# Patient Record
Sex: Female | Born: 1935 | Hispanic: No | Smoking: Never smoker
Health system: Southern US, Community
[De-identification: ages and names within clinical notes are randomized; demographics above are authoritative.]

## PROBLEM LIST (undated history)

## (undated) DIAGNOSIS — J45909 Unspecified asthma, uncomplicated: Secondary | ICD-10-CM

## (undated) DIAGNOSIS — E7800 Pure hypercholesterolemia, unspecified: Secondary | ICD-10-CM

## (undated) DIAGNOSIS — I10 Essential (primary) hypertension: Secondary | ICD-10-CM

## (undated) DIAGNOSIS — I2510 Atherosclerotic heart disease of native coronary artery without angina pectoris: Secondary | ICD-10-CM

## (undated) DIAGNOSIS — H269 Unspecified cataract: Secondary | ICD-10-CM

## (undated) DIAGNOSIS — M1990 Unspecified osteoarthritis, unspecified site: Secondary | ICD-10-CM

## (undated) DIAGNOSIS — R55 Syncope and collapse: Secondary | ICD-10-CM

## (undated) DIAGNOSIS — F32A Depression, unspecified: Secondary | ICD-10-CM

## (undated) DIAGNOSIS — H918X9 Other specified hearing loss, unspecified ear: Secondary | ICD-10-CM

## (undated) DIAGNOSIS — J984 Other disorders of lung: Secondary | ICD-10-CM

## (undated) DIAGNOSIS — H539 Unspecified visual disturbance: Secondary | ICD-10-CM

## (undated) DIAGNOSIS — J449 Chronic obstructive pulmonary disease, unspecified: Secondary | ICD-10-CM

## (undated) DIAGNOSIS — R262 Difficulty in walking, not elsewhere classified: Secondary | ICD-10-CM

## (undated) DIAGNOSIS — K219 Gastro-esophageal reflux disease without esophagitis: Secondary | ICD-10-CM

## (undated) HISTORY — DX: Chronic obstructive pulmonary disease, unspecified: J44.9

## (undated) HISTORY — DX: Unspecified asthma, uncomplicated: J45.909

---

## 2006-07-07 ENCOUNTER — Ambulatory Visit
Admit: 2006-07-07 | Disposition: A | Discharge status: Home / Self Care | Payer: Self-pay | Source: Ambulatory Visit | Admitting: Internal Medicine

## 2007-06-14 ENCOUNTER — Ambulatory Visit
Admit: 2007-06-14 | Disposition: A | Discharge status: Home / Self Care | Payer: Self-pay | Source: Ambulatory Visit | Admitting: Internal Medicine

## 2007-06-14 LAB — BASIC METABOLIC PANEL - AH CERNER
Anion Gap: 10 mEq/L (ref 5–15)
BUN: 9 mg/dL (ref 8–23)
CO2: 25.6 mEq/L (ref 23.0–32.5)
Calcium: 9.8 mg/dL (ref 8.8–10.2)
Chloride: 95 mEq/L — ABNORMAL LOW (ref 96–108)
Creatinine: 0.7 mg/dL (ref 0.4–1.1)
Glucose: 80 mg/dL
Osmolality Calculated: 270 mosm/kg — ABNORMAL LOW (ref 282–298)
Potassium: 3.9 mEq/L (ref 3.3–5.1)
Sodium: 131 mEq/L — ABNORMAL LOW (ref 133–145)
UN/CREA SOFT: 13 RATIO (ref 6–33)

## 2007-06-14 LAB — HEMOLYSIS INDEX: Hemolysis Index: 6 Units

## 2007-06-14 LAB — GFR

## 2007-06-20 ENCOUNTER — Ambulatory Visit
Admit: 2007-06-20 | Disposition: A | Discharge status: Home / Self Care | Payer: Self-pay | Source: Ambulatory Visit | Admitting: Internal Medicine

## 2012-01-11 ENCOUNTER — Ambulatory Visit
Admit: 2012-01-11 | Discharge: 2012-01-11 | Disposition: A | Discharge status: Home / Self Care | Payer: Self-pay | Source: Ambulatory Visit

## 2012-01-11 LAB — URINE MICROSCOPIC

## 2012-01-11 LAB — COMPREHENSIVE METABOLIC PANEL
ALT: 16 U/L (ref 0–55)
AST (SGOT): 19 U/L (ref 5–34)
Albumin/Globulin Ratio: 1.2 (ref 0.9–2.2)
Albumin: 3.7 g/dL (ref 3.5–5.0)
Alkaline Phosphatase: 50 U/L (ref 40–150)
BUN: 9 mg/dL (ref 6.0–20.0)
Bilirubin, Total: 0.3 mg/dL (ref 0.1–1.2)
CO2: 29 mEq/L (ref 21–30)
Calcium: 10 mg/dL (ref 7.9–10.6)
Chloride: 93 mEq/L — ABNORMAL LOW (ref 96–109)
Creatinine: 0.8 mg/dL (ref 0.4–1.5)
Globulin: 3 g/dL (ref 2.0–3.7)
Glucose: 102 mg/dL — ABNORMAL HIGH (ref 70–100)
Potassium: 4.8 mEq/L (ref 3.5–5.3)
Protein, Total: 6.7 g/dL (ref 6.0–8.3)
Sodium: 131 mEq/L — ABNORMAL LOW (ref 135–146)

## 2012-01-11 LAB — CBC
Hematocrit: 37.7 % (ref 37.0–47.0)
Hgb: 12.9 g/dL (ref 12.0–16.0)
MCH: 29.1 pg (ref 28.0–32.0)
MCHC: 34.2 g/dL (ref 32.0–36.0)
MCV: 84.9 fL (ref 80.0–100.0)
MPV: 8.5 fL — ABNORMAL LOW (ref 9.4–12.3)
Nucleated RBC: 0 /100 WBC
Platelets: 276 10*3/uL (ref 140–400)
RBC: 4.44 10*6/uL (ref 4.20–5.40)
RDW: 13 % (ref 12–15)
WBC: 6.01 10*3/uL (ref 3.50–10.80)

## 2012-01-11 LAB — URINALYSIS
Bilirubin, UA: NEGATIVE
Glucose, UA: NEGATIVE
Ketones UA: NEGATIVE
Leukocyte Esterase, UA: NEGATIVE
Nitrite, UA: NEGATIVE
Protein, UR: NEGATIVE
Specific Gravity UA POCT: 1.008 (ref 1.001–1.035)
Urine pH: 7 (ref 5.0–8.0)
Urobilinogen, UA: 0.2 EU/dL

## 2012-01-11 LAB — GFR: EGFR: >60

## 2012-01-11 LAB — HEMOLYSIS INDEX: Hemolysis Index: 6 Index (ref 0–9)

## 2012-01-11 LAB — AMYLASE: Amylase: 163 U/L — ABNORMAL HIGH (ref 25–125)

## 2012-01-12 ENCOUNTER — Ambulatory Visit
Admit: 2012-01-12 | Discharge: 2012-01-12 | Disposition: A | Discharge status: Home / Self Care | Payer: Self-pay | Source: Ambulatory Visit

## 2012-01-12 LAB — VITAMIN D-25 HYDROXY (D2/D3/TOTAL)
Vitamin D 25-OH D2: <4 ng/mL
Vitamin D 25-OH D3: 39 ng/mL
Vitamin D 25-OH Total: 39 ng/mL (ref 30–100)

## 2012-01-13 ENCOUNTER — Ambulatory Visit
Admission: RE | Admit: 2012-01-13 | Payer: Self-pay | Source: Ambulatory Visit | Attending: Gastroenterology | Admitting: Gastroenterology

## 2012-01-13 ENCOUNTER — Ambulatory Visit: Payer: Self-pay

## 2012-01-21 ENCOUNTER — Ambulatory Visit
Admit: 2012-01-21 | Discharge: 2012-01-21 | Disposition: A | Discharge status: Home / Self Care | Payer: Self-pay | Source: Ambulatory Visit

## 2012-01-21 LAB — HEPATIC FUNCTION PANEL
ALT: 15 U/L (ref 0–55)
AST (SGOT): 18 U/L (ref 5–34)
Albumin/Globulin Ratio: 1.3 (ref 0.9–2.2)
Albumin: 3.8 g/dL (ref 3.5–5.0)
Alkaline Phosphatase: 52 U/L (ref 40–150)
Bilirubin Direct: 0.2 mg/dL (ref 0.0–0.5)
Bilirubin Indirect: 0.3 mg/dL (ref 0.0–1.0)
Bilirubin, Total: 0.5 mg/dL (ref 0.1–1.2)
Globulin: 3 g/dL (ref 2.0–3.7)
Protein, Total: 6.8 g/dL (ref 6.0–8.3)

## 2012-01-21 LAB — LIPID PANEL
Cholesterol / HDL Ratio: 2.4 Index
Cholesterol: 199 mg/dL (ref 0–199)
HDL: 84 mg/dL (ref >40)
LDL Calculated: 100 mg/dL — AB (ref 0–99)
Triglycerides: 76 mg/dL (ref 34–149)
VLDL Calculated: 15 mg/dL (ref 10–40)

## 2012-01-21 LAB — T3: T3: 1.04 ng/mL (ref 0.48–1.59)

## 2012-01-21 LAB — LIPASE: Lipase: 75 U/L (ref 8–78)

## 2012-01-21 LAB — T4, FREE: T4 Free: 1.19 ng/dL (ref 0.70–1.48)

## 2012-01-21 LAB — TSH: TSH: 2.45 u[IU]/mL (ref 0.35–4.94)

## 2012-01-21 LAB — SEDIMENTATION RATE: Sed Rate: 12 mm/Hr (ref 0–20)

## 2012-01-21 LAB — HEMOLYSIS INDEX: Hemolysis Index: 6 Index (ref 0–9)

## 2012-01-21 LAB — AMYLASE: Amylase: 119 U/L (ref 25–125)

## 2012-04-12 ENCOUNTER — Ambulatory Visit
Admit: 2012-04-12 | Discharge: 2012-04-12 | Disposition: A | Discharge status: Home / Self Care | Payer: Self-pay | Source: Ambulatory Visit

## 2012-05-04 ENCOUNTER — Ambulatory Visit
Admission: RE | Admit: 2012-05-04 | Discharge: 2012-05-04 | Disposition: A | Discharge status: Home / Self Care | Payer: Self-pay | Source: Ambulatory Visit | Attending: Gastroenterology | Admitting: Gastroenterology

## 2012-06-08 ENCOUNTER — Encounter
Admission: RE | Disposition: A | Discharge status: Home / Self Care | Payer: Self-pay | Source: Ambulatory Visit | Attending: Gastroenterology

## 2012-06-08 ENCOUNTER — Ambulatory Visit: Payer: Medicare Other | Admitting: Anesthesiology

## 2012-06-08 ENCOUNTER — Encounter: Payer: Self-pay | Admitting: Anesthesiology

## 2012-06-08 ENCOUNTER — Ambulatory Visit
Admission: RE | Admit: 2012-06-08 | Discharge: 2012-06-08 | Disposition: A | Discharge status: Home / Self Care | Payer: Medicare Other | Source: Ambulatory Visit | Attending: Gastroenterology | Admitting: Gastroenterology

## 2012-06-08 ENCOUNTER — Ambulatory Visit: Payer: Medicare Other | Admitting: Gastroenterology

## 2012-06-08 DIAGNOSIS — I10 Essential (primary) hypertension: Secondary | ICD-10-CM | POA: Insufficient documentation

## 2012-06-08 DIAGNOSIS — K648 Other hemorrhoids: Secondary | ICD-10-CM | POA: Insufficient documentation

## 2012-06-08 DIAGNOSIS — J40 Bronchitis, not specified as acute or chronic: Secondary | ICD-10-CM | POA: Insufficient documentation

## 2012-06-08 DIAGNOSIS — K219 Gastro-esophageal reflux disease without esophagitis: Secondary | ICD-10-CM | POA: Insufficient documentation

## 2012-06-08 DIAGNOSIS — K5730 Diverticulosis of large intestine without perforation or abscess without bleeding: Secondary | ICD-10-CM | POA: Insufficient documentation

## 2012-06-08 DIAGNOSIS — F3289 Other specified depressive episodes: Secondary | ICD-10-CM | POA: Insufficient documentation

## 2012-06-08 DIAGNOSIS — D126 Benign neoplasm of colon, unspecified: Secondary | ICD-10-CM | POA: Insufficient documentation

## 2012-06-08 DIAGNOSIS — H9190 Unspecified hearing loss, unspecified ear: Secondary | ICD-10-CM | POA: Insufficient documentation

## 2012-06-08 DIAGNOSIS — Z1211 Encounter for screening for malignant neoplasm of colon: Secondary | ICD-10-CM | POA: Insufficient documentation

## 2012-06-08 HISTORY — DX: Essential (primary) hypertension: I10

## 2012-06-08 HISTORY — DX: Unspecified cataract: H26.9

## 2012-06-08 HISTORY — DX: Depression, unspecified: F32.A

## 2012-06-08 HISTORY — DX: Unspecified visual disturbance: H53.9

## 2012-06-08 HISTORY — DX: Difficulty in walking, not elsewhere classified: R26.2

## 2012-06-08 HISTORY — DX: Syncope and collapse: R55

## 2012-06-08 HISTORY — DX: Unspecified osteoarthritis, unspecified site: M19.90

## 2012-06-08 HISTORY — DX: Other specified hearing loss, unspecified ear: H91.8X9

## 2012-06-08 HISTORY — DX: Other disorders of lung: J98.4

## 2012-06-08 HISTORY — DX: Gastro-esophageal reflux disease without esophagitis: K21.9

## 2012-06-08 SURGERY — DONT USE, USE 1094-COLONOSCOPY, DIAGNOSTIC (SCREENING)
Anesthesia: Anesthesia General | Site: Abdomen | Wound class: Clean Contaminated

## 2012-06-08 MED ORDER — PROPOFOL INFUSION 10 MG/ML
INTRAVENOUS | Status: DC | PRN
Start: 2012-06-08 — End: 2012-06-08
  Administered 2012-06-08 (×2): 40 mg via INTRAVENOUS

## 2012-06-08 MED ORDER — LACTATED RINGERS IV SOLN
INTRAVENOUS | Status: DC | PRN
Start: 2012-06-08 — End: 2012-06-08

## 2012-06-08 MED ORDER — ESMOLOL HCL 10 MG/ML IV SOLN
INTRAVENOUS | Status: DC | PRN
Start: 2012-06-08 — End: 2012-06-08
  Administered 2012-06-08: 10 mg via INTRAVENOUS

## 2012-06-08 MED ORDER — PROPOFOL INFUSION 10 MG/ML
INTRAVENOUS | Status: DC | PRN
Start: 2012-06-08 — End: 2012-06-08
  Administered 2012-06-08: 120 ug/kg/min via INTRAVENOUS

## 2012-06-08 MED ORDER — LABETALOL HCL 5 MG/ML IV SOLN
INTRAVENOUS | Status: DC | PRN
Start: 2012-06-08 — End: 2012-06-08
  Administered 2012-06-08: 5 mg via INTRAVENOUS

## 2012-06-08 MED ORDER — LIDOCAINE HCL 2 % IJ SOLN
INTRAMUSCULAR | Status: DC | PRN
Start: 2012-06-08 — End: 2012-06-08
  Administered 2012-06-08: 40 mg

## 2012-06-08 SURGICAL SUPPLY — 10 items
FORCEPS BIOPSY L240 CM MICROMESH TEETH STREAMLINE CATHETER NEEDLE (Instrument) IMPLANT
FORCEPS BIOPSY L240 CM STANDARD CAPACITY (Instrument) ×1
GOWN ISL PP PE REG LG LF FULL BCK NK TIE (Gown) ×2
GOWN ISOLATION REGULAR LARGE FULL BACK NECK TIE ELASTIC CUFF (Gown) ×2 IMPLANT
SOLN LUBRICATING JELLY 4.25OZ (Irrigation Solutions) ×1 IMPLANT
SPONGE GAUZE L4 IN X W4 IN 16 PLY (Dressing) ×1
SPONGE GAUZE L4 IN X W4 IN 16 PLY MAXIMUM ABSORBENT USP TYPE VII (Dressing) ×1 IMPLANT
WATER STERILE PLASTIC POUR BOTTLE 1000 (Irrigation Solutions) ×1
WATER STERILE PLASTIC POUR BOTTLE 1000 ML (Irrigation Solutions) ×1 IMPLANT
WATER STERILE PLASTIC POUR BOTTLE 250 ML (Irrigation Solutions) ×1 IMPLANT

## 2012-06-08 NOTE — Anesthesia Postprocedure Evaluation (Signed)
Anesthesia Post Evaluation    Patient: Jane [name]    Procedures performed: Procedure(s) with comments:  COLONOSCOPY - colon     Anesthesia type: General TIVA    Patient location:[facility]    Last vitals:   Filed Vitals:    06/08/12 1055   BP: 155/70   Pulse: 80   Temp:    Resp: 23   SpO2: 100%       Post pain: Patient not complaining of pain, continue current therapy     Mental Status:awake and alert     Respiratory Function: tolerating room air    Cardiovascular: stable    Nausea/Vomiting: patient not complaining of nausea or vomiting    Hydration Status: adequate    Post assessment: no apparent anesthetic complications and no reportable events

## 2012-06-08 NOTE — Anesthesia Preprocedure Evaluation (Signed)
Anesthesia Evaluation    AIRWAY    Mallampati: II    TM distance: >3 FB  Neck ROM: full  Mouth Opening:full   CARDIOVASCULAR    cardiovascular exam normal     DENTAL        (+) upper dentures and lower dentures   PULMONARY    pulmonary exam normal     OTHER FINDINGS              Anesthesia Plan    ASA 2   general   Detailed anesthesia plan: general IV        informed consent obtained    Plan discussed with CRNA.

## 2012-06-08 NOTE — Transfer of Care (Signed)
Anesthesia Transfer of Care Note    Patient: Jane [name]    Procedures performed: Procedure(s) with comments:  COLONOSCOPY - colon     Anesthesia type: MAC    Patient location:[facility]    Last vitals:   Filed Vitals:    06/08/12 1034   BP: 130/60   Pulse: 75   Temp: 97.7 F (36.5 C)   Resp: 18   SpO2: 100%       Post pain: Patient not complaining of pain, continue current therapy     Mental Status:sleepy    Respiratory Function: tolerating nasal cannula    Cardiovascular: stable    Nausea/Vomiting: patient not complaining of nausea or vomiting    Hydration Status: adequate    Post assessment: no apparent anesthetic complications

## 2012-06-08 NOTE — H&P (Signed)
GI PRE PROCEDURE NOTE    Proceduralist Comments:   Review of Systems and Past Medical / Surgical History performed: Yes     Indications:Colon cancer screening    Previous Adverse Reaction to Anesthesia or Sedation (if yes, describe): No    Physical Exam / Laboratory Data (If applicable)   Airway Classification: As per the anesthesiologist    General: Alert and cooperative  Lungs: Lungs clear to auscultation  Cardiac: RRR, normal S1S2.    Abdomen: Soft, non tender. Normal active bowel sounds  Other:        Scheduled Meds: PRN Meds:         Continuous Infusions:       . lactated ringers 20 mL/hr at 06/08/12 0953         lactated ringers  Continuous PRN           Home Medications:     Prescriptions prior to admission   Medication Sig Dispense Refill   . aspirin 81 MG tablet Take 81 mg by mouth daily.       . fluticasone-salmeterol (ADVAIR DISKUS) 100-50 MCG/DOSE AEPB Inhale 1 puff into the lungs.       . metoprolol XL (TOPROL-XL) 25 MG 24 hr tablet Take 25 mg by mouth daily.       . Multiple Vitamin (MULTIVITAMIN) tablet Take 1 tablet by mouth daily.       . Multiple Vitamins-Minerals (CENTRUM SILVER PO) Take by mouth.       . sertraline (ZOLOFT) 50 MG tablet Take 50 mg by mouth daily.       . simvastatin (ZOCOR) 20 MG tablet Take 20 mg by mouth nightly.       Marland Kitchen VALSARTAN PO Take 12.5 mg by mouth.           Planned Sedation:   Deep sedation with anesthesia    Attestation:   Kashina P Poss has been reassessed immediately prior to the procedure and is an appropriate candidate for the planned sedation and procedure. Risks, benefits and alternatives to the planned procedure and sedation have been explained to the patient or guardian:  yes        Signed by: Jossie Ng.D.

## 2013-05-13 ENCOUNTER — Emergency Department: Payer: Medicare Other

## 2013-05-13 ENCOUNTER — Inpatient Hospital Stay: Payer: Medicare Other | Admitting: Internal Medicine

## 2013-05-13 ENCOUNTER — Inpatient Hospital Stay
Admission: EM | Admit: 2013-05-13 | Discharge: 2013-05-18 | DRG: 287 | Disposition: A | Discharge status: Home Health | Payer: Medicare Other | Attending: Internal Medicine | Admitting: Internal Medicine

## 2013-05-13 DIAGNOSIS — R531 Weakness: Secondary | ICD-10-CM | POA: Diagnosis present

## 2013-05-13 DIAGNOSIS — K5900 Constipation, unspecified: Secondary | ICD-10-CM | POA: Diagnosis present

## 2013-05-13 DIAGNOSIS — I079 Rheumatic tricuspid valve disease, unspecified: Secondary | ICD-10-CM | POA: Diagnosis present

## 2013-05-13 DIAGNOSIS — R5381 Other malaise: Secondary | ICD-10-CM | POA: Diagnosis present

## 2013-05-13 DIAGNOSIS — R269 Unspecified abnormalities of gait and mobility: Secondary | ICD-10-CM | POA: Diagnosis present

## 2013-05-13 DIAGNOSIS — M129 Arthropathy, unspecified: Secondary | ICD-10-CM | POA: Diagnosis present

## 2013-05-13 DIAGNOSIS — I209 Angina pectoris, unspecified: Secondary | ICD-10-CM | POA: Diagnosis present

## 2013-05-13 DIAGNOSIS — D649 Anemia, unspecified: Secondary | ICD-10-CM | POA: Diagnosis present

## 2013-05-13 DIAGNOSIS — I2510 Atherosclerotic heart disease of native coronary artery without angina pectoris: Principal | ICD-10-CM | POA: Diagnosis present

## 2013-05-13 DIAGNOSIS — E871 Hypo-osmolality and hyponatremia: Secondary | ICD-10-CM | POA: Diagnosis present

## 2013-05-13 DIAGNOSIS — I10 Essential (primary) hypertension: Secondary | ICD-10-CM | POA: Diagnosis present

## 2013-05-13 DIAGNOSIS — F411 Generalized anxiety disorder: Secondary | ICD-10-CM | POA: Diagnosis present

## 2013-05-13 DIAGNOSIS — E7800 Pure hypercholesterolemia, unspecified: Secondary | ICD-10-CM | POA: Diagnosis present

## 2013-05-13 DIAGNOSIS — F0280 Dementia in other diseases classified elsewhere without behavioral disturbance: Secondary | ICD-10-CM | POA: Diagnosis present

## 2013-05-13 DIAGNOSIS — R5383 Other fatigue: Secondary | ICD-10-CM | POA: Diagnosis present

## 2013-05-13 DIAGNOSIS — F3289 Other specified depressive episodes: Secondary | ICD-10-CM | POA: Diagnosis present

## 2013-05-13 DIAGNOSIS — G309 Alzheimer's disease, unspecified: Secondary | ICD-10-CM | POA: Diagnosis present

## 2013-05-13 DIAGNOSIS — K219 Gastro-esophageal reflux disease without esophagitis: Secondary | ICD-10-CM | POA: Diagnosis present

## 2013-05-13 DIAGNOSIS — J45909 Unspecified asthma, uncomplicated: Secondary | ICD-10-CM | POA: Diagnosis present

## 2013-05-13 LAB — MAN DIFF ONLY
Atypical Lymphocytes %: 7 %
Atypical Lymphocytes Absolute: 0.57 — ABNORMAL HIGH
Band Neutrophils Absolute: 0 (ref 0.00–1.00)
Band Neutrophils: 0 %
Basophils Absolute Manual: 0 (ref 0.00–0.20)
Basophils Manual: 0 %
Eosinophils Absolute Manual: 0.08 (ref 0.00–0.70)
Eosinophils Manual: 1 %
Lymphocytes Absolute Manual: 1.55 (ref 0.50–4.40)
Lymphocytes Manual: 19 %
Monocytes Absolute: 0.82 (ref 0.00–1.20)
Monocytes Manual: 10 %
Neutrophils Absolute Manual: 5.15 (ref 1.80–8.10)
Nucleated RBC: 0 (ref 0–1)
Segmented Neutrophils: 63 %

## 2013-05-13 LAB — COMPREHENSIVE METABOLIC PANEL
ALT: 14 U/L (ref 0–55)
AST (SGOT): 22 U/L (ref 5–34)
Albumin/Globulin Ratio: 1.4 (ref 0.9–2.2)
Albumin: 3.8 g/dL (ref 3.5–5.0)
Alkaline Phosphatase: 35 U/L — ABNORMAL LOW (ref 40–150)
Anion Gap: 11 (ref 5.0–15.0)
BUN: 11 mg/dL (ref 7.0–19.0)
Bilirubin, Total: 0.6 mg/dL (ref 0.2–1.2)
CO2: 23 (ref 22–29)
Calcium: 9.7 mg/dL (ref 7.9–10.6)
Chloride: 96 — ABNORMAL LOW (ref 98–107)
Creatinine: 0.8 mg/dL (ref 0.6–1.0)
Globulin: 2.8 g/dL (ref 2.0–3.6)
Glucose: 91 mg/dL (ref 70–100)
Potassium: 4.3 (ref 3.5–5.1)
Protein, Total: 6.6 g/dL (ref 6.0–8.3)
Sodium: 130 — ABNORMAL LOW (ref 136–145)

## 2013-05-13 LAB — CBC AND DIFFERENTIAL
Hematocrit: 38.3 % (ref 37.0–47.0)
Hgb: 13.6 g/dL (ref 12.0–16.0)
MCH: 30.4 pg (ref 28.0–32.0)
MCHC: 35.5 g/dL (ref 32.0–36.0)
MCV: 85.5 fL (ref 80.0–100.0)
MPV: 7.5 fL — ABNORMAL LOW (ref 9.4–12.3)
Platelets: 345 (ref 140–400)
RBC: 4.48 (ref 4.20–5.40)
RDW: 13 % (ref 12–15)
WBC: 8.17 (ref 3.50–10.80)

## 2013-05-13 LAB — B-TYPE NATRIURETIC PEPTIDE: B-Natriuretic Peptide: 61 pg/mL (ref 0–100)

## 2013-05-13 LAB — CK: Creatine Kinase (CK): 56 U/L (ref 29–168)

## 2013-05-13 LAB — TROPONIN I: Troponin I: 0.02 ng/mL (ref 0.00–0.09)

## 2013-05-13 LAB — GFR: EGFR: >60

## 2013-05-13 MED ORDER — NITROGLYCERIN 2 % TD OINT
0.5000 [in_us] | TOPICAL_OINTMENT | Freq: Once | TRANSDERMAL | Status: AC
Start: 2013-05-13 — End: 2013-05-13
  Administered 2013-05-13: 0.5 [in_us] via TOPICAL
  Filled 2013-05-13: qty 1

## 2013-05-13 MED ORDER — ASPIRIN 81 MG PO CHEW
324.0000 mg | CHEWABLE_TABLET | Freq: Once | ORAL | Status: AC
Start: 2013-05-13 — End: 2013-05-13
  Administered 2013-05-13: 324 mg via ORAL
  Filled 2013-05-13: qty 4

## 2013-05-13 NOTE — ED Notes (Signed)
C/o left side chest pain x 5 days, denies SOB, cough, urinary problem and abdominal pain.

## 2013-05-13 NOTE — ED Provider Notes (Signed)
EMERGENCY DEPARTMENT HISTORY AND PHYSICAL EXAM     Physician/Midlevel provider first contact with patient: 05/13/13 2015         Date: 05/13/2013  Patient Name: Jane [name]    History of Presenting Illness     Chief Complaint   Patient presents with   . Chest Pain       History Provided By: Patient and Daughter    Chief Complaint: Chest pain  Onset: Acute  Timing: Constant   Location: Center chest  Quality:  Pressure, heaviness  Severity: Moderate  Modifying Factors: None  Associated Symptoms: Weakness    Additional History: Jane [name] is a 79 y.o. female presents to ED with chest pain that started approximately 2 hours ago while a passenger driving back from [place]. Pt daughter states she was seen in [place], [place] approx 1 week ago for chest pain and states hospital diagnosis was "chest pain due to old age".  Pain started around 630 this afternoon.  Pt states pain is heavy, and pressure, does not radiate anywhere. Pt states nothing makes pain better or worse.     PCP: Darnelle Catalan, MD      Current Facility-Administered Medications   Medication Dose Route Frequency Provider Last Rate Last Dose   . [COMPLETED] aspirin chewable tablet 324 mg  324 mg Oral Once Sharyne Richters, MD   324 mg at 05/13/13 2145   . [COMPLETED] nitroglycerin (NITRO-BID) 2 % ointment 0.5 inch  0.5 inch Topical Once Sharyne Richters, MD   0.5 inch at 05/13/13 2329     Current Outpatient Prescriptions   Medication Sig Dispense Refill   . calcium-vitamin D (OSCAL-500) 500-200 MG-UNIT per tablet Take 1 tablet by mouth daily.       Marland Kitchen econazole nitrate 1 % cream Apply topically daily.       . furosemide (LASIX) 20 MG tablet Take 20 mg by mouth 2 (two) times daily.       . Misc Natural Products (HIMALAYAN GOJI PO) Take by mouth.       . simethicone (MYLICON) 80 MG chewable tablet Chew 80 mg by mouth every 6 (six) hours as needed.       Marland Kitchen aspirin 81 MG tablet Take 81 mg by mouth daily.       . fluticasone-salmeterol (ADVAIR DISKUS) 100-50  MCG/DOSE AEPB Inhale 1 puff into the lungs.       . metoprolol XL (TOPROL-XL) 25 MG 24 hr tablet Take 25 mg by mouth daily.       . Multiple Vitamin (MULTIVITAMIN) tablet Take 1 tablet by mouth daily.       . Multiple Vitamins-Minerals (CENTRUM SILVER PO) Take by mouth.       . sertraline (ZOLOFT) 50 MG tablet Take 50 mg by mouth daily.       . simvastatin (ZOCOR) 20 MG tablet Take 20 mg by mouth nightly.       Marland Kitchen VALSARTAN PO Take 12.5 mg by mouth.           Past History     Past Medical History:  Past Medical History   Diagnosis Date   . Arthritis    . Hypertensive disorder    . Lung disease      Bronchitis   . Syncope and collapse      Weak with Wt. loss has difficulty walking   . GERD (gastroesophageal reflux disease)    . Difficulty in walking(719.7)    . Cataracts, bilateral    .  Vision abnormalities    . Hearing loss NEC    . Depression        Past Surgical History:  Past Surgical History   Procedure Date   . Upper gastrointestinal endoscopy        Family History:  History reviewed. No pertinent family history.    Social History:  History   Substance Use Topics   . Smoking status: Never Smoker    . Smokeless tobacco: Not on file   . Alcohol Use: No       Allergies:  No Known Allergies    Review of Systems     Review of Systems   Cardiovascular: Positive for chest pain.        + chest pressure, heaviness   Psychiatric/Behavioral: Positive for depression.   All other systems reviewed and are negative.          Physical Exam   BP 197/90  Pulse 81  Temp 98.9 F (37.2 C)  Resp 16  Ht 1.499 m  Wt 50 kg  BMI 22.25 kg/m2  SpO2 99%    Physical Exam   Nursing note and vitals reviewed.  Constitutional: She is oriented to person, place, and time. She appears well-developed and well-nourished.   HENT:   Mouth/Throat: Oropharynx is clear and moist.   Eyes: Conjunctivae normal are normal. Pupils are equal, round, and reactive to light.   Neck: No JVD present.   Cardiovascular: Normal rate and regular rhythm.     Pulmonary/Chest: Effort normal and breath sounds normal.   Abdominal: Soft. Bowel sounds are normal.   Musculoskeletal:        No lower ext tenderness/edema   Neurological: She is alert and oriented to person, place, and time.   Skin: Skin is warm and dry.         Diagnostic Study Results     Labs -     Results     Procedure Component Value Units Date/Time    CK with MB if Indicated [[id number]] Collected:05/13/13 2035    Specimen Information:Blood Updated:05/13/13 2151     Creatine Kinase (CK) 56 U/L     Troponin I [[id number]] Collected:05/13/13 2059    Specimen Information:Blood Updated:05/13/13 2141     Troponin I 0.02 ng/mL     CBC with differential [[id number]]  (Abnormal) Collected:05/13/13 2059    Specimen Information:Blood / Blood Updated:05/13/13 2130     WBC 8.17      RBC 4.48      Hgb 13.6 g/dL      Hematocrit 08.6 %      MCV 85.5 fL      MCH 30.4 pg      MCHC 35.5 g/dL      RDW 13 %      Platelets 345      MPV 7.5 (L) fL     Manual Differential [[id number]]  (Abnormal) Collected:05/13/13 2059     Segmented Neutrophils 63 % Updated:05/13/13 2130     Band Neutrophils 0 %      Lymphocytes Manual 19 %      Monocytes Manual 10 %      Eosinophils Manual 1 %      Basophils Manual 0 %      Atypical Lymph % 7 %      Nucleated RBC 0      Abs Seg Manual 5.15      Bands Absolute 0.00      Absolute Lymph Manual 1.55  Monocytes Absolute 0.82      Absolute Eos Manual 0.08      Absolute Baso Manual 0.00      Atypical Lymph Absolute 0.57 (H)     B-type Natriuretic Peptide (BNP) [[id number]] Collected:05/13/13 2059    Specimen Information:Blood Updated:05/13/13 2123     B-Natriuretic Peptide 61 pg/mL     Comprehensive metabolic panel [[id number]]  (Abnormal) Collected:05/13/13 2035    Specimen Information:Blood Updated:05/13/13 2119     Glucose 91 mg/dL      BUN 78.2 mg/dL      Creatinine 0.8 mg/dL      Sodium 956 (L)      Potassium 4.3      Chloride 96 (L)      CO2 23      CALCIUM 9.7 mg/dL      Protein, Total 6.6 g/dL       Albumin 3.8 g/dL      AST (SGOT) 22 U/L      ALT 14 U/L      Alkaline Phosphatase 35 (L) U/L      Bilirubin, Total 0.6 mg/dL      Globulin 2.8 g/dL      Albumin/Globulin Ratio 1.4      Anion Gap 11.0     GFR [[id number]] Collected:05/13/13 2035     EGFR >60.0 Updated:05/13/13 2119          Radiologic Studies -   Radiology Results (24 Hour)     Procedure Component Value Units Date/Time    Chest 2 Views [[id number]] Collected:05/13/13 2224    Order Status:Completed  Updated:05/13/13 2230    Narrative:    HISTORY: chest pain    TECHNIQUE: PA and lateral views of the chest were obtained.     COMPARISON: 04/12/2012.    FINDINGS:    The cardiomediastinal contours are within normal limits.   No pneumothorax is seen. There are stable prominent interstitial  markings at the hila and lung bases compatible with prominent vascular  markings and scarring. No superimposed focal consolidations are  demonstrated. A 3 mm calcified granuloma is noted within the right  midlung.       Impression:     No evidence of acute cardiopulmonary abnormality.    Gustavus Messing, MD   05/13/2013 10:26 PM      .      Medical Decision Making   I am the first provider for this patient.    I reviewed the vital signs, available nursing notes, past medical history, past surgical history, family history and social history.    Vital Signs-Reviewed the patient's vital signs.     Patient Vitals for the past 12 hrs:   BP Temp Pulse Resp   05/13/13 2300 197/90 mmHg - 81  -   05/13/13 2217 194/87 mmHg - 87  16    05/13/13 2157 184/95 mmHg - 88  18    05/13/13 2025 128/86 mmHg 98.9 F (37.2 C) 85  18        Pulse Oximetry Analysis - Normal 98% on RA    EKG:  Interpreted by the EP.   Time Interpreted: 2006   Rate: 88   Rhythm: Normal Sinus Rhythm NSR    Interpretation: less than 1 mm inferior and lateral depressions   Comparison: No prior study is available for comparison.    Old Medical Records: Nursing notes.     ED Course:   9:49 PM - Discussed pt case with Dr.  Philip Aspen, cardiology, who  will consult.     10:15 PM - D/w Dr. Rowland Lathe, covering for Dr. Arie Sabina, will admit.     10:51 PM -  Patient transport documents collected and filled out for discharge.    Provider Notes: Discussed at length with daughter.  EKG abnl, but w/out elevations in cardiac enzymes.  Patient has previously seen Dr. Graciela Husbands and requests his services for admission.  Admit for evaluation.      Core Measures:  ASA given.     Diagnosis     Clinical Impression:   1. Chest pain        _______________________________    Attestations:  This note is prepared by Gerrit Friends, acting as Scribe for Ulice Bold, MD    Ulice Bold, MD:  The scribe's documentation has been prepared under my direction and personally reviewed by me in its entirety.  I confirm that the note above accurately reflects all work, treatment, procedures, and medical decision making performed by me.        _______________________________          Sharyne Richters, MD  05/13/13 [phone]

## 2013-05-13 NOTE — ED Notes (Addendum)
Pt was unable to urinate Dr. Fran Lowes notified

## 2013-05-13 NOTE — ED Notes (Signed)
Pt went to bathroom x2 to get urine specimen but unable to void.

## 2013-05-14 LAB — URINALYSIS, REFLEX TO MICROSCOPIC EXAM IF INDICATED
Bilirubin, UA: NEGATIVE
Blood, UA: NEGATIVE
Glucose, UA: NEGATIVE
Ketones UA: NEGATIVE
Nitrite, UA: NEGATIVE
Protein, UR: NEGATIVE
Specific Gravity UA: 1.006 (ref 1.001–1.035)
Urine pH: 8 (ref 5.0–8.0)
Urobilinogen, UA: NEGATIVE mg/dL

## 2013-05-14 LAB — ECG 12-LEAD
Atrial Rate: 88 {beats}/min
P Axis: 80 degrees
P-R Interval: 128 ms
Q-T Interval: 338 ms
QRS Duration: 74 ms
QTC Calculation (Bezet): 408 ms
R Axis: 48 degrees
T Axis: 65 degrees
Ventricular Rate: 88 {beats}/min

## 2013-05-14 LAB — CK
Creatine Kinase (CK): 52 U/L (ref 29–168)
Creatine Kinase (CK): 49 U/L (ref 29–168)

## 2013-05-14 LAB — HEMOLYSIS INDEX
Hemolysis Index: 13 (ref 0–18)
Hemolysis Index: 11 (ref 0–18)

## 2013-05-14 LAB — TROPONIN I
Troponin I: 0.04 ng/mL (ref 0.00–0.09)
Troponin I: 0.05 ng/mL (ref 0.00–0.09)

## 2013-05-14 LAB — IHS D-DIMER: D-Dimer: 0.22 (ref 0.00–0.51)

## 2013-05-14 MED ORDER — FLUTICASONE-SALMETEROL 100-50 MCG/DOSE IN AEPB
1.0000 | INHALATION_SPRAY | Freq: Two times a day (BID) | RESPIRATORY_TRACT | Status: DC
Start: 2013-05-14 — End: 2013-05-18
  Administered 2013-05-14 – 2013-05-18 (×6): 1 via RESPIRATORY_TRACT
  Filled 2013-05-14: qty 14

## 2013-05-14 MED ORDER — SIMVASTATIN 10 MG PO TABS
20.0000 mg | ORAL_TABLET | Freq: Every evening | ORAL | Status: DC
Start: 2013-05-14 — End: 2013-05-18
  Administered 2013-05-14 – 2013-05-17 (×4): 20 mg via ORAL
  Filled 2013-05-14 (×4): qty 2

## 2013-05-14 MED ORDER — VITAMINS/MINERALS PO TABS
1.0000 | ORAL_TABLET | Freq: Every day | ORAL | Status: DC
Start: 2013-05-14 — End: 2013-05-18
  Administered 2013-05-14 – 2013-05-18 (×5): 1 via ORAL
  Filled 2013-05-14 (×5): qty 1

## 2013-05-14 MED ORDER — ASPIRIN 81 MG PO CHEW
324.0000 mg | CHEWABLE_TABLET | Freq: Once | ORAL | Status: AC
Start: 2013-05-14 — End: 2013-05-14
  Administered 2013-05-14: 324 mg via ORAL
  Filled 2013-05-14: qty 4

## 2013-05-14 MED ORDER — CALCIUM CITRATE-VITAMIN D 315-250 MG-UNIT PO TABS
1.0000 | ORAL_TABLET | Freq: Every day | ORAL | Status: DC
Start: 2013-05-14 — End: 2013-05-18
  Administered 2013-05-14 – 2013-05-18 (×5): 1 via ORAL
  Filled 2013-05-14 (×5): qty 1

## 2013-05-14 MED ORDER — AQUADEKS PO CAPS
1.0000 | ORAL_CAPSULE | Freq: Every day | ORAL | Status: DC
Start: 2013-05-14 — End: 2013-05-14

## 2013-05-14 MED ORDER — HEPARIN SODIUM (PORCINE) 5000 UNIT/ML IJ SOLN
5000.0000 [IU] | Freq: Two times a day (BID) | INTRAMUSCULAR | Status: DC
Start: 2013-05-14 — End: 2013-05-18
  Administered 2013-05-14 – 2013-05-18 (×8): 5000 [IU] via SUBCUTANEOUS
  Filled 2013-05-14 (×8): qty 1

## 2013-05-14 MED ORDER — SERTRALINE HCL 50 MG PO TABS
50.0000 mg | ORAL_TABLET | Freq: Every day | ORAL | Status: DC
Start: 2013-05-14 — End: 2013-05-15
  Administered 2013-05-14: 50 mg via ORAL
  Filled 2013-05-14 (×2): qty 1

## 2013-05-14 MED ORDER — METOPROLOL TARTRATE 25 MG PO TABS
25.0000 mg | ORAL_TABLET | Freq: Two times a day (BID) | ORAL | Status: DC
Start: 2013-05-14 — End: 2013-05-18
  Administered 2013-05-14 – 2013-05-18 (×9): 25 mg via ORAL
  Filled 2013-05-14 (×9): qty 1

## 2013-05-14 NOTE — H&P (Signed)
Hand P dictated. [id number]. Needs cardiology consult.

## 2013-05-14 NOTE — Plan of Care (Signed)
Report given at bed side,  plan of care discussed w/ Pt,  pain MGT, on anticoagulant,   probably  will be NPO after midningt for stress test tomorrow.  Severe Sepsis Screen  Date: 05/14/2013 Time: 8:55 PM  Nurse Signature: Camilo Mander    A. Infection:    Does your patient have ONE or more of the following infection criteria?     []  Documented Infection - Does the patient have positive culture results (from blood,        sputum, urine, etc)?   []  Anti-Infective Therapy - Is the patient receiving antibiotic, antifungal, or other                anti-infective therapy?   []  Pneumonia - Is there documentation of pneumonia (X Ray, etc)?   []  WBC's - Have WBC's been found in normally sterile fluid (urine, CSF, etc.)?   []  Perforated Viscus -Does the patient have a perforated hollow organ (bowel)?    A.  Did you check any of the boxes above?    [x]  No  If No, Stop Here and Sepsis Screen Negative.               []  Yes, continue to section B      B. SIRS:     Does your patient have TWO or more of the following SIRS criteria (ensure vital signs & temperature are within 1 hour of this screening)?    []  Temperature - Is the patient's temperature: Temp: 97 F (36.1 C) (05/14/13 2002)   - Greater than or equal to 38.3 degrees C (greater than 100.9 degrees F)?   - Less than or equal to 36 degrees C (less than or equal to 96.8 degrees F)?    []  Heart Rate: Heart Rate: 78  (05/14/13 2002)   - Is the patient's heart rate greater than or equal to 90 bpm?    []  Respiratory: Resp Rate: 18  (05/14/13 2002)   - Is the patient's respiratory rate greater than or equal to 20?    []  WBC Count - Is the patient's WBC count:   Lab 05/13/13 2059   WBC 8.17      - Greater than or equal to 12,000/mm3 OR   - Less than or equal to 4,000/mm3 OR    - Are there greater than 10% immature neutrophils (bands)?    []  Glucose >140 without diabetes?   Lab 05/13/13 2035   GLU 91       []  Significant edema?    B.  Did you check two or more of the boxes above?      [x]  No, Stop Here and Sepsis Screen Negative   []  Yes, continue to section C    C.  Acute Organ Dysfunction     Does your patient have ONE or more of the following organ dysfunction? (May need to wait for lab results for assessment - see below) Organ dysfunction must be a result of the sepsis not chronic conditions.    []  Cardiovascular - Does the patient have a: BP: 128/58 mmHg (05/14/13 2002)   -systolic Blood pressure less than or equal to 90 mmHg OR   -systolic blood pressure has dropped 40 mmHg or more from baseline OR   -mean arterial pressure less than or equal to 70 mmHg (for at least one hour   despite fluid resuscitation OR require vasopressor support?  []  Respiratory - Does the patient have new hypoxia defined by any  of the following"   -A sustained increase in oxygen requirements by at least 2L/min on NC or 28%    FiO2 within the last 24 hrs OR   -A persistent decrease in oxygen saturation of greater than or equal to 5% lasting   at least four or more hours and occurring within the last 24 hours  []  Renal - Does the patient have:   -low urine output (e.g. Less than 0.81mL/kg/HR for one hour despite adequate fluid    resuscitation, OR   -Increased creatinine (greater than 50% increase from baseline) OR   -require acute dialysis?  []  Hematologic - Does the patient have a:   -Low platelet count (less than 100,000 mm3)   Lab 05/13/13 2059   PLT 345   OR   -INR/aPTT greater than upper limit of normal?No results found for this basename: INR:1 in the last 168 hours or                No results found for this basename: APTT:1 in the last 168 hours  []  Metabolic - Does the patient have a high lactate (plasma lactate greater than or equal to 2.4 mMol/L)? No results found for this basename: LACTATE:5 in the last 168 hours    []  Hepatic - Are the patient's liver enzymes elevated (ALT greater than 72 IU/L or Total      Bilirubin greater than 2 MG/dL)?    Lab 05/13/13 2035   BILITOTAL 0.6   ALT 14     []  CNS - Does  the patient have altered consciousness or reduced Glasgow Coma      Scale?    C.  Did you check any of the boxes above?     [x]  No, Sepsis Screen Negative   []  YES:  A) Infection + B) SIRS + C) Organ Dysfunction = Positive Screen for Severe Sepsis     Notify MD and document in Complex Assessment under provider notification   - Name of physician notified:                                           - Date/Time Notifiied:                                             Document actions: Following must be completed within 1 hour of positive sepsis screen   []  Lactate drawn (if initial lactate > , repeat lactate in 2 hours for goal decrease 10-20%)   []  Blood Cultures obtained (prior to antibiotic administration; if not done within the last 48 hours)   []  Antibiotics initiated or modified    []   IV Fluid administered 0.9% NS __________ mLs given (Initial Bolus of 30 ml/kg if SBP < 90 or MAP < 65 or lactate greater than 4 mmol/dl)  Nursing Comments?:     _________________________________________________________________    Patients meeting the following criteria are excluded from screening (check if applicable):   []  [place] hypothermia protocol   []  Comfort Care orders   []  Surgery- No screening for 24 hours after surgery (48 hours after CV surgery)       -If Yes. Date of surgery:______________________   []  Admitted with sepsis and until 72 hours after admission with documented sepsis (  RESUME SEPSIS SCREEN AFTER 72 hour window!!!)      -If Yes, Date of Documented Sepsis:______________________   _0  Positive screen AND Completed sepsis bundle during previous 24 hours       -If Yes, Date/Time of positive severe sepsis screen:_______________________

## 2013-05-14 NOTE — Plan of Care (Signed)
Problem: Pain  Goal: Patient's pain/discomfort is manageable  Outcome: Progressing  Pt c/o CP w/ bearable mild pain level; nitro paste on L chest;  VSS, SR on the monitor; pt/family encouraged to verbalize needs/concerns

## 2013-05-14 NOTE — H&P (Signed)
Patient Type: I     ATTENDING PHYSICIAN: Altha Harm, MD     ATTENDING PHYSICIAN:     Gean Quint, MD     HISTORY OF PRESENT ILLNESS:  [name] is an 79 year old female who presented to the emergency  room at [facility] because patient was having chest pain for about the past  2 hours and while she was coming from [place].  Per the daughter,  patient was seen in [place], [place] approximately 1 week ago for chest  pain.  There was no definitive diagnosis made and the patient was  discharged from there.  On arrival here to the ER, she complained of chest  heaviness, had some shortness of breath, but no diaphoresis, no nausea,  vomiting, no diarrhea.  The patient admitted here to the floor to rule out  any cardiac ischemic event.  Here on the floor patient looks very  depressed, confused, and she does admit that she is having chest pain for  the past 2 weeks and she did had some shortness of breath, but no cough.   No fever, no chills, no rigors.  No abdominal pain, no nausea, vomiting,  diarrhea.  No urinary symptoms.  The patient looks very depressed and she  says she does not want to live and she feels very weak.     PAST MEDICAL HISTORY:  Significant for hypertension, hypercholesterolemia, some bronchitis, GERD  and some vision abnormality.     PAST SURGICAL HISTORY:  EGD was done.     SOCIAL HISTORY:  No history of smoking, alcohol, or drugs.     ALLERGIES:  No known drug allergies.     VITAL SIGNS:    Blood pressure 145/70, heart rate of 90, respiration rate of 18, and she is  saturating 100% on room air.     REVIEW OF SYSTEMS:    CENTRAL NERVOUS SYSTEM:  No loss of consciousness.  No dizziness.  CARDIOVASCULAR:  She does complain of chest pain and mild shortness of  breath.  RESPIRATORY:  No dyspnea on exertion, no PND, no orthopnea.  GASTROINTESTINAL:  No issues.  GENITOURINARY:  No urinary symptoms.     PHYSICAL EXAMINATION:    GENERAL:  The patient is an elderly, frail female, looks very  confused and  depressive.  HEENT:  She has anicteric sclerae, sunken eyeballs.  Pupils are equal, loss  of buccal part of fat and temporal wasting.  NECK:  No JVD, not using accessory muscle.  No carotid bruit.  CHEST:  She has bilateral vesicular breath sounds.  No rales, rhonchi, any  wheezes.  CARDIOVASCULAR:  S1, S2 normal, no murmurs.  ABDOMEN:  Soft, nontender, no guarding, no rigidity.  EXTREMITIES:  No edema, clubbing, or any cyanosis.  NEUROLOGIC:  She is awake, alert, oriented.     LABORATORY DATA:  Done showed WBC 8, hemoglobin and hematocrit 13 and 38 with platelet count  of 345.  Sodium 130, potassium 4.3, chloride 96, bicarbonate 23, BUN 11,  creatinine 0.8.  LFTs within normal limits.  Troponin 0.02 and 0.04.   Urinalysis is negative.  CT scan not done.  Chest x-ray:  No acute  infiltrate.  A 12-lead EKG:  Normal sinus rhythm.     ASSESSMENT AND PLAN:  [name] is an 79 year old female who came in with some chest  pain.    1.  Central nervous system:  Awake, alert, oriented, but very depressed.   She said that "I don't want  to live because of generalized weakness."  2.  Cardiovascular:  No history of coronary artery disease, but did come in  with chest pain, normal sinus rhythm, mildly elevated cardiac enzymes.  We  will get a 2D echocardiogram and a cardiology consult.   3.  Gastrointestinal:  No issues.    4.  Genitourinary and renal:  Creatinine is normal.  Adequate urine output.     5.  Infectious disease:  No leukocytosis.  No antibiotics for now.    6.  For stress ulcer prophylaxis, on Protonix.    7.  For deep venous thrombosis, we will place her on heparin  subcutaneously.     The patient's condition is guarded.  We will manage the patient on the  floor.           D:  05/14/2013 07:07 AM by Dr. Cathleen Fears, MD ([id number])  T:  05/14/2013 07:35 AM by [id number]      Everlean Cherry: [id number]) (Doc ID: [id number])

## 2013-05-14 NOTE — Progress Notes (Signed)
Pt's VSS; c/o CP remains bearable; pt ambulates to BR w/ assistance; informed consent for [facility] to release pt's old records signed by pt, and faxed; safety and fall precautions maintained; family members/pt informed re POC.

## 2013-05-14 NOTE — Plan of Care (Signed)
Pt admitted from [facility] due to CP x5 days, she lives at home w/her daughter and family. They were on their way home from [place] when Pt mentioned she continued to have CP so daughter went straight to [facility]. VS and assessment complete, Pt oriented to room, advised to utilize call bell when ambulating, hope to obtain UA, AM blood work to continue checking cardiac enzymes, and possible CT later today. Daughter and Pt had no questions at this time. Pt stated she no longer had CP. No pain or discomfort anywhere, denied SOB, N/V, palpitations, dizziness or light headedness.    Severe Sepsis Screen  Date: 05/14/2013 Time: 1:29 AM  Nurse Signature: Johnathan Hausen Danitza Schoenfeldt    A. Infection:    Does your patient have ONE or more of the following infection criteria?     []  Documented Infection - Does the patient have positive culture results (from blood,        sputum, urine, etc)?   []  Anti-Infective Therapy - Is the patient receiving antibiotic, antifungal, or other                anti-infective therapy?   []  Pneumonia - Is there documentation of pneumonia (X Ray, etc)?   []  WBC's - Have WBC's been found in normally sterile fluid (urine, CSF, etc.)?   []  Perforated Viscus -Does the patient have a perforated hollow organ (bowel)?    A.  Did you check any of the boxes above?    [x]  No  If No, Stop Here and Sepsis Screen Negative.               []  Yes, continue to section B      B. SIRS:     Does your patient have TWO or more of the following SIRS criteria (ensure vital signs & temperature are within 1 hour of this screening)?    []  Temperature - Is the patient's temperature: Temp: 97.3 F (36.3 C) (05/14/13 0008)   - Greater than or equal to 38.3 degrees C (greater than 100.9 degrees F)?   - Less than or equal to 36 degrees C (less than or equal to 96.8 degrees F)?    []  Heart Rate: Heart Rate: 77  (05/14/13 0008)   - Is the patient's heart rate greater than or equal to 90 bpm?    []  Respiratory: Resp Rate: 18  (05/14/13 0008)   - Is the  patient's respiratory rate greater than or equal to 20?    []  WBC Count - Is the patient's WBC count:   Lab 05/13/13 2059   WBC 8.17      - Greater than or equal to 12,000/mm3 OR   - Less than or equal to 4,000/mm3 OR    - Are there greater than 10% immature neutrophils (bands)?    []  Glucose >140 without diabetes?   Lab 05/13/13 2035   GLU 91       []  Significant edema?    B.  Did you check two or more of the boxes above?     []  No, Stop Here and Sepsis Screen Negative   []  Yes, continue to section C    C.  Acute Organ Dysfunction     Does your patient have ONE or more of the following organ dysfunction? (May need to wait for lab results for assessment - see below) Organ dysfunction must be a result of the sepsis not chronic conditions.    []  Cardiovascular - Does the patient  have a: BP: 155/82 mmHg (05/14/13 0008)   -systolic Blood pressure less than or equal to 90 mmHg OR   -systolic blood pressure has dropped 40 mmHg or more from baseline OR   -mean arterial pressure less than or equal to 70 mmHg (for at least one hour   despite fluid resuscitation OR require vasopressor support?  []  Respiratory - Does the patient have new hypoxia defined by any of the following"   -A sustained increase in oxygen requirements by at least 2L/min on NC or 28%    FiO2 within the last 24 hrs OR   -A persistent decrease in oxygen saturation of greater than or equal to 5% lasting   at least four or more hours and occurring within the last 24 hours  []  Renal - Does the patient have:   -low urine output (e.g. Less than 0.62mL/kg/HR for one hour despite adequate fluid    resuscitation, OR   -Increased creatinine (greater than 50% increase from baseline) OR   -require acute dialysis?  []  Hematologic - Does the patient have a:   -Low platelet count (less than 100,000 mm3)   Lab 05/13/13 2059   PLT 345   OR   -INR/aPTT greater than upper limit of normal?No results found for this basename: INR:1 in the last 168 hours or                No  results found for this basename: APTT:1 in the last 168 hours  []  Metabolic - Does the patient have a high lactate (plasma lactate greater than or equal to 2.4 mMol/L)? No results found for this basename: LACTATE:5 in the last 168 hours    []  Hepatic - Are the patient's liver enzymes elevated (ALT greater than 72 IU/L or Total      Bilirubin greater than 2 MG/dL)?    Lab 05/13/13 2035   BILITOTAL 0.6   ALT 14     []  CNS - Does the patient have altered consciousness or reduced Glasgow Coma      Scale?    C.  Did you check any of the boxes above?     []  No, Sepsis Screen Negative   []  YES:  A) Infection + B) SIRS + C) Organ Dysfunction = Positive Screen for Severe Sepsis     Notify MD and document in Complex Assessment under provider notification   - Name of physician notified:                                           - Date/Time Notifiied:                                             Document actions: Following must be completed within 1 hour of positive sepsis screen   []  Lactate drawn (if initial lactate > , repeat lactate in 2 hours for goal decrease 10-20%)   []  Blood Cultures obtained (prior to antibiotic administration; if not done within the last 48 hours)   []  Antibiotics initiated or modified    []   IV Fluid administered 0.9% NS __________ mLs given (Initial Bolus of 30 ml/kg if SBP < 90 or MAP < 65 or lactate greater than 4 mmol/dl)  Nursing Comments?:  _________________________________________________________________    Patients meeting the following criteria are excluded from screening (check if applicable):   []  Ar[place] Sun hypothermia protocol   []  Comfort Care orders   []  Surgery- No screening for 24 hours after surgery (48 hours after CV surgery)       -If Yes. Date of surgery:______________________   []  Admitted with sepsis and until 72 hours after admission with documented sepsis (RESUME SEPSIS SCREEN AFTER 72 hour window!!!)      -If Yes, Date of Documented  Sepsis:______________________   []  Positive screen AND Completed sepsis bundle during previous 24 hours       -If Yes, Date/Time of positive severe sepsis screen:_______________________

## 2013-05-14 NOTE — Treatment Plan (Signed)
VTE/PE Risk Screening  Complete Upon Admission and Transfer to Different Level of Care  Completed by nurse: Melvenia Beam 05/14/2013 1:30 AM   -----------------------------------------------------------------------------------------------------------  SECTION 1 - Risk Screening     []   Patient currently receiving anticoagulation therapy (Heparin, Lovenox, Coumadin, Pradaxa, Xarelto, or Arixtra Only) and received 1 dose within 24 hours of admission STOP HERE   []   VTE/PE prophylaxis currently prescribed elsewhere - STOP HERE   []   Comfort Care - STOP HERE   []   Clinical Trials - STOP HERE    Contraindications: Patients with a history of the following conditions cannot haveSequential compression devices (SCD     []  Any of these conditions present , Call MD for pharmacological prophylaxis or ask MD to document reason for not having both mechanical and pharmacologic VTE prophylaxis   []  Post-op vein ligation   []  Suspected VTE   []  Cellulitis/Dermatitis of the leg   []  Severe ischemic Vascular disease   []  Edema related to Congestive Heart Faliure   []  Gangrene   []  Recent skin graft  -----------------------------------------------------------------------------------------------------------  SECTION 2 - Risk Factors (Check all that apply)    Moderate Risk Factors   []   Heart Failure (current or history of)   []   Respiratory Failure   []   Acute Myocardial Infarction (AMI)   []   Acute Infection   []   Rheumatologic Disorder   [x]   Elderly age (79 years old)   []   Ongoing hormonal treatment / estrogen use (including Tamoxifen, Raloxifene)   []   Obesity (BMI >/= 30kg/m2)    High Risk Factors   []   Recent (</= 1 month) trauma/surgery    Highest Risk Factors   []   Active Cancer   []   Previous VTE   []   Reduced mobility (>24 hrs; current or anticipated)   []   Known thrombophilic condition (hematological disorders that promote thrombosis)      []   No boxes checked in this section indicate patient is at low risk for VTE. No VTE  Prophylaxis indicated.      [x]   One or more risk factors present, enter an EPIC order for Sequential compression devices (SCD). Use per protocol, MD signature required.

## 2013-05-14 NOTE — Plan of Care (Signed)
Pt A&Ox4, ambulates w/assist. Denies any continued SOB, CP, N/V, dizziness, head ache or other pain/discomfort. Troponin (-) x 2, third @ 0800. Cardiac consult pending. Family visited and grandson stayed at bedside however Pt OOB still attached to SCD while grandson in the room. Advised of necessity of having Pt assisted to the bathroom by someone for her safety.

## 2013-05-14 NOTE — Consults (Signed)
Service Date: 05/14/2013     Patient Type: I     CONSULTING PHYSICIAN: Margrett Rud Shakeena Kafer MD     REFERRING PHYSICIAN:      CONSULTING SERVICE:  Cardiology.     REASON FOR CONSULTATION:  Chest pain.     HISTORY OF PRESENT ILLNESS:  This patient is an 79 year old Jane [name].  She has a history of hypertension,  dyslipidemia, and presumptive diagnosis of coronary artery disease.  She  presented to the hospital with the main complaint of chest pain.  Chest  pain is on the left side, is off and on and according to the patient, she  has been experiencing it for the last 1 week.  Chest pain is described as  heavy sensation on the left side without any radiation.  Evaluation in the  emergency room showed blood pressure 197/90, heart rate 81.  Her EKG showed  normal sinus rhythm, 0.5 mm ST depression in II, III, aVF.     The patient is also complaining of shortness of breath, which is mainly on  exertion.  She denies fever and chills.     PAST MEDICAL HISTORY:  1.  Hypertension.  2.  Arthritis.  3.  History of GERD.  4.  Presumptive diagnosis of coronary artery disease.     MEDICATIONS:  Aspirin 325 mg once a day, metoprolol 25 mg twice a day, nitroglycerin  half-inch every 6 hours, Zocor 20 mg once a day.     SOCIAL HISTORY:  The patient lives at home.  She has grown-up children.  She does not smoke  cigarettes.     REVIEW OF SYSTEMS:  CONSTITUTIONAL:  Positive for weakness.  Negative for fever and chills.   Negative for weight loss and diaphoresis.  HEENT:  Negative for sore throat, runny nose.  Negative for epistaxis.  RESPIRATORY:  Positive for coughing.  Positive for shortness of breath on  exertion.  CARDIOVASCULAR:  Positive for chest pain.  Positive for dyspnea on  exertion.  Negative for PND and orthopnea.  Negative for palpitations.  GASTROINTESTINAL:  Negative for nausea, vomiting, abdominal pain.  Negative  for diarrhea, constipation.  GENITOURINARY:  Negative for dysuria, hematuria.  MUSCULOSKELETAL:  Positive for  generalized weakness, joint pain and muscle  weakness.  NEUROLOGIC:  Negative for focal neurological deficit.     PHYSICAL EXAMINATION:   VITAL SIGNS:  Blood pressure 137/63, heart rate 82, respirations 18.  HEENT:  Head atraumatic.  Pupils equal, round.  NECK:  Supple, no JVD.  HEART:  S1, S2, no S3, II/VI systolic murmur left sternal border.  LUNGS:  Fine crackles in the bases in lower 1/3.  ABDOMEN:  Positive bowel sounds, soft.  EXTREMITIES:  Trace edema, dorsalis pedis pulses 1+ bilaterally.     LABORATORY DATA:  WBC 8.1, hematocrit 38.3, platelets 345.  BUN 11, creatinine 0.8, sodium  130, potassium 4.3.  AST 22, ALT 14.  Troponin I is 0.02.     Chest x-ray:  Normal cardiac silhouette, no infiltrate.     IMPRESSION:  1.  Chest pain, rule out myocardial infarction.  2.  Hypertension.  Blood pressure is under control.  3.  Gastroesophageal reflux disease.  4.  Fatigue.  RECOMMENDATIONS:  1.  Admit to telemetry unit.  2.  Cardiac enzymes.  3.  Lipid profile.  4.  Thyroid function test.  5.  Aspirin.  6.  Nitrates.  7.  Beta blockers.  8.  D-dimer and proceed with a CTA if D-dimer  elevated.  9.  We will check old records in the office.           D:  05/14/2013 13:18 PM by Dr. Sande Brothers, MD [phone])  T:  05/14/2013 16:32 PM by       Everlean Cherry: [id number]) (Doc ID: [id number])

## 2013-05-15 ENCOUNTER — Inpatient Hospital Stay: Payer: Medicare Other

## 2013-05-15 MED ORDER — MAGNESIUM HYDROXIDE 400 MG/5ML PO SUSP
30.0000 mL | Freq: Every day | ORAL | Status: DC
Start: 2013-05-15 — End: 2013-05-18
  Administered 2013-05-16 – 2013-05-17 (×2): 30 mL via ORAL
  Filled 2013-05-15 (×4): qty 30

## 2013-05-15 MED ORDER — DOCUSATE SODIUM 100 MG PO CAPS
100.0000 mg | ORAL_CAPSULE | Freq: Every day | ORAL | Status: DC
Start: 2013-05-15 — End: 2013-05-18
  Administered 2013-05-15 – 2013-05-18 (×4): 100 mg via ORAL
  Filled 2013-05-15 (×4): qty 1

## 2013-05-15 MED ORDER — SERTRALINE HCL 50 MG PO TABS
100.0000 mg | ORAL_TABLET | Freq: Every day | ORAL | Status: DC
Start: 2013-05-16 — End: 2013-05-18
  Administered 2013-05-16 – 2013-05-18 (×3): 100 mg via ORAL
  Filled 2013-05-15 (×3): qty 2

## 2013-05-15 MED ORDER — TECHNETIUM TC 99M TETROFOSMIN INJECTION
1.0000 | Freq: Once | Status: AC | PRN
Start: 2013-05-15 — End: 2013-05-15
  Administered 2013-05-15: 1 via INTRAVENOUS

## 2013-05-15 MED ORDER — NITROGLYCERIN 0.2 MG/HR TD PT24
1.0000 | MEDICATED_PATCH | TRANSDERMAL | Status: DC
Start: 2013-05-15 — End: 2013-05-18
  Administered 2013-05-15 – 2013-05-18 (×4): 1 via TRANSDERMAL
  Filled 2013-05-15 (×4): qty 1

## 2013-05-15 MED ORDER — REGADENOSON 0.4 MG/5ML IV SOLN
INTRAVENOUS | Status: AC
Start: 2013-05-15 — End: 2013-05-15
  Filled 2013-05-15: qty 5

## 2013-05-15 MED ORDER — ASPIRIN EC 81 MG PO TBEC
81.00 mg | DELAYED_RELEASE_TABLET | Freq: Every day | ORAL | Status: DC
Start: 2013-05-15 — End: 2013-05-18
  Administered 2013-05-15 – 2013-05-18 (×4): 81 mg via ORAL
  Filled 2013-05-15 (×4): qty 1

## 2013-05-15 NOTE — Plan of Care (Signed)
Problem: Inadequate Tissue Perfusion  Goal: Adequate tissue perfusion will be maintained  Outcome: Progressing  Pt initially c/o HTN; VSS today, SR on the monitor; pt NPO for stress test today; no c/o chest pain reported

## 2013-05-15 NOTE — Plan of Care (Signed)
Problem: Inadequate Tissue Perfusion  Goal: Adequate tissue perfusion will be maintained  Intervention: Monitor/assess vital signs  Pt. is alert and oriented x 3,  sinus rhythm on the monitor, had stable vitals during the night, NPO after midnight for stress test tomorrow, slept well during the night.

## 2013-05-15 NOTE — Progress Notes (Signed)
Pt remains stable throughout the shift; denies CP; stress test and echo done; PT/OT eval; CM on board; good appetite; safety and fall precautions maintained; SR on the monitor.

## 2013-05-15 NOTE — Progress Notes (Signed)
SW spoke with the pt and pt's daughter, Lum Keas [phone]. The lives with her other daughter, Theora Gianotti [phone] (cell). The pt does not use any DME's. No history of SNF or HH. No PT/OT assessments completed at this time. SW will follow up as needed.

## 2013-05-15 NOTE — Progress Notes (Signed)
PROGRESS NOTE    Date Time: 05/15/2013 2:26 PM  Patient Name: Jane [name]      Assessment:       1. Chest pain, rule out myocardial infarction.   2. Hypertension. Blood pressure is under control.   3. Gastroesophageal reflux disease.   4. Fatigue.     RECOMMENDATIONS:   1. Stress test today.   2. Echo.   3. W/u for fatigue as per Dr. Graciela Husbands..   4. Out of bed and ambulate.   5. Aspirin.   6. Nitrates.   7. Beta blockers.       Subjective/chief complaint:   Patient is seen and examined  All medications and labs  reviewed    Medications:     Current Facility-Administered Medications   Medication Dose Route Frequency   . aspirin EC  81 mg Oral Daily   . calcium citrate-Vitamin D  1 tablet Oral Daily   . docusate sodium  100 mg Oral Daily   . fluticasone-salmeterol  1 puff Inhalation BID   . heparin (porcine)  5,000 Units Subcutaneous Q12H SCH   . magnesium hydroxide  30 mL Oral Daily   . metoprolol  25 mg Oral Q12H SCH   . [COMPLETED] regadenoson       . [START ON 05/16/2013] sertraline  100 mg Oral Daily   . simvastatin  20 mg Oral QHS   . vitamins/minerals  1 tablet Oral Daily   . [DISCONTINUED] sertraline  50 mg Oral Daily       Review of Systems:   General ROS: no weight loss, no weight gain, no fever, no chills  Hematological and Lymphatic ROS: negative  Endocrine ROS: + fatigue, no polyuria, no polydipsia, no general weakness  Respiratory ROS: no shortness of breath, no cough, no wheezes and no hemoptysis  Cardiovascular ROS: +chest pain, no palpitations, no PND and no orthopnea, +DOE  Gastrointestinal ROS:no nausea, no vomiting, no diarrhea, no constipation, no blood in stool and no abdominal pain  Musculoskeletal ROS: no muscle pain, + muscle weakness, no joint pain, no swelling and no redness  Neurological ROS: no headache, no dizziness, no diplopia, no focal weakness, no seizure  Dermatological ROS: no rash, no itching and no ecchymoses, no pressure ulcer      Physical Exam:   BP 139/65  Pulse 73  Temp  96.9 F (36.1 C) (Oral)  Resp 16  Ht 1.448 Jane [name] (4\' 9" )  Wt 53.343 kg (117 lb 9.6 oz)  BMI 25.44 kg/m2  SpO2 98%      Intake/Output Summary (Last 24 hours) at 05/15/13 1426  Last data filed at 05/15/13 0600   Gross per 24 hour   Intake    340 ml   Output      0 ml   Net    340 ml       General appearance - alert, well appearing, and in no distress  Mental status - alert, oriented to person, place, and time  HEENT:normocephalic, EOMI, PERRL, no icterus, no pallor, no cyanosis  Neck: supple, no JVD, no thyromegaly, no carotid bruits  Chest - crackles to  to auscultation, no wheezes, rales or rhonchi, symmetric air entry  Heart - normal rate, regular rhythm, normal S1, S2, no S3 ,+murmurs, rubs, clicks or gallops  Abdomen - soft, nontender, nondistended, no masses or organomegaly  Neurological - alert, oriented, normal speech, no focal findings  noted  Musculoskeletal - no joint tenderness, deformity or swelling seen.  Extremities -  peripheral pulses normal, no pedal edema, no clubbing or cyanosis  Skin - normal color , no rashes.    Labs:     CBC w/Diff     Lab 05/13/13 2059   WBC 8.17   HGB 13.6   HCT 38.3   PLT 345          Basic Metabolic Profile     Lab 05/13/13 2035   NA 130*   K 4.3   CL 96*   CO2 23   BUN 11.0   CREAT 0.8   CA 9.7   ALT 14   AST 22   GLU 91          Cardiac Enzymes     Lab 05/14/13 0811 05/14/13 0205 05/13/13 2059 05/13/13 2035   CK 49 52 -- 56   TROPI 0.05 0.04 0.02 --   TROPT -- -- -- --   CKMBINDEX -- -- -- --       Lab Results   Component Value Date    BNP 61 05/13/2013          Thyroid Studies    No results found for this basename: TSH,FREET3,FREET4 in the last 168 hours        Lipid Profile   No results found for this basename: CHOL in the last 168 hours       Radiology Results (24 Hour)     Procedure Component Value Units Date/Time    CV Cardiac Stress Test Tracing Only [[id number]] Resulted:05/15/13 1230    Order Status:Completed  Updated:05/15/13 1422    Narrative:    DESCRIPTION OF  PROCEDURE:  The patient underwent Lexiscan stress test.  She received Lexiscan 0.4 mg  IV, flushed by normal saline.  The patient did not experience chest pain.   There were no significant EKG changes from the baseline.    IMPRESSION:  Negative Lexiscan stress.                Sande Brothers, MD  05/15/2013 2:26 PM

## 2013-05-15 NOTE — Plan of Care (Signed)
Report given at bed side, plan of care discussed w/ pt, on anticoagulant, PT/OT evaluation tomorrow morning,  Pt. verbalized understanding.

## 2013-05-15 NOTE — Plan of Care (Signed)
ISHAPED done @ bedside. Pt alert and able to verbalize needs; not in resp distress; fall and safety precautions reviewed; call bell within reach; telemetry and VS monitoring;  hourly rounding; pt encouraged to verbalize needs;    POC: continue VS and telemetry monitoring; safety and fall precautions;  pt education re medication compliance and side effects; pt NPO for stress test today    Severe Sepsis Screen  Date: 05/15/2013 Time: 9:09 AM  Nurse Signature: Kiernan Farkas Rycca R Ithzel Fedorchak    A. Infection:    Does your patient have ONE or more of the following infection criteria?     []  Documented Infection - Does the patient have positive culture results (from blood,        sputum, urine, etc)?   []  Anti-Infective Therapy - Is the patient receiving antibiotic, antifungal, or other                anti-infective therapy?   []  Pneumonia - Is there documentation of pneumonia (X Ray, etc)?   []  WBC's - Have WBC's been found in normally sterile fluid (urine, CSF, etc.)?   []  Perforated Viscus -Does the patient have a perforated hollow organ (bowel)?    A.  Did you check any of the boxes above?    [x]  No  If No, Stop Here and Sepsis Screen Negative.               []  Yes, continue to section B      B. SIRS:     Does your patient have TWO or more of the following SIRS criteria (ensure vital signs & temperature are within 1 hour of this screening)?    []  Temperature - Is the patient's temperature: Temp: 96.9 F (36.1 C) (05/15/13 0739)   - Greater than or equal to 38.3 degrees C (greater than 100.9 degrees F)?   - Less than or equal to 36 degrees C (less than or equal to 96.8 degrees F)?    []  Heart Rate: Heart Rate: 73  (05/15/13 0739)   - Is the patient's heart rate greater than or equal to 90 bpm?    []  Respiratory: Resp Rate: 16  (05/15/13 0739)   - Is the patient's respiratory rate greater than or equal to 20?    []  WBC Count - Is the patient's WBC count:   Lab 05/13/13 2059   WBC 8.17      - Greater than or equal to 12,000/mm3 OR   -  Less than or equal to 4,000/mm3 OR    - Are there greater than 10% immature neutrophils (bands)?    []  Glucose >140 without diabetes?   Lab 05/13/13 2035   GLU 91       []  Significant edema?    B.  Did you check two or more of the boxes above?     []  No, Stop Here and Sepsis Screen Negative   []  Yes, continue to section C    C.  Acute Organ Dysfunction     Does your patient have ONE or more of the following organ dysfunction? (May need to wait for lab results for assessment - see below) Organ dysfunction must be a result of the sepsis not chronic conditions.    []  Cardiovascular - Does the patient have a: BP: 139/65 mmHg (05/15/13 0739)   -systolic Blood pressure less than or equal to 90 mmHg OR   -systolic blood pressure has dropped 40 mmHg or more from baseline OR   -  mean arterial pressure less than or equal to 70 mmHg (for at least one hour   despite fluid resuscitation OR require vasopressor support?  []  Respiratory - Does the patient have new hypoxia defined by any of the following"   -A sustained increase in oxygen requirements by at least 2L/min on NC or 28%    FiO2 within the last 24 hrs OR   -A persistent decrease in oxygen saturation of greater than or equal to 5% lasting   at least four or more hours and occurring within the last 24 hours  []  Renal - Does the patient have:   -low urine output (e.g. Less than 0.91mL/kg/HR for one hour despite adequate fluid    resuscitation, OR   -Increased creatinine (greater than 50% increase from baseline) OR   -require acute dialysis?  []  Hematologic - Does the patient have a:   -Low platelet count (less than 100,000 mm3)   Lab 05/13/13 2059   PLT 345   OR   -INR/aPTT greater than upper limit of normal?No results found for this basename: INR:1 in the last 168 hours or                No results found for this basename: APTT:1 in the last 168 hours  []  Metabolic - Does the patient have a high lactate (plasma lactate greater than or equal to 2.4 mMol/L)? No results found  for this basename: LACTATE:5 in the last 168 hours    []  Hepatic - Are the patient's liver enzymes elevated (ALT greater than 72 IU/L or Total      Bilirubin greater than 2 MG/dL)?    Lab 05/13/13 2035   BILITOTAL 0.6   ALT 14     []  CNS - Does the patient have altered consciousness or reduced Glasgow Coma      Scale?    C.  Did you check any of the boxes above?     []  No, Sepsis Screen Negative   []  YES:  A) Infection + B) SIRS + C) Organ Dysfunction = Positive Screen for Severe Sepsis     Notify MD and document in Complex Assessment under provider notification   - Name of physician notified:                                           - Date/Time Notifiied:                                             Document actions: Following must be completed within 1 hour of positive sepsis screen   []  Lactate drawn (if initial lactate > , repeat lactate in 2 hours for goal decrease 10-20%)   []  Blood Cultures obtained (prior to antibiotic administration; if not done within the last 48 hours)   []  Antibiotics initiated or modified    []   IV Fluid administered 0.9% NS __________ mLs given (Initial Bolus of 30 ml/kg if SBP < 90 or MAP < 65 or lactate greater than 4 mmol/dl)  Nursing Comments?:     _________________________________________________________________    Patients meeting the following criteria are excluded from screening (check if applicable):   []  [place] hypothermia protocol   []  Comfort Care orders   []  Surgery- No  screening for 24 hours after surgery (48 hours after CV surgery)       -If Yes. Date of surgery:______________________   []  Admitted with sepsis and until 72 hours after admission with documented sepsis (RESUME SEPSIS SCREEN AFTER 72 hour window!!!)      -If Yes, Date of Documented Sepsis:______________________   []  Positive screen AND Completed sepsis bundle during previous 24 hours       -If Yes, Date/Time of positive severe sepsis screen:_______________________

## 2013-05-15 NOTE — Consults (Signed)
Psych Consult Dictated:

## 2013-05-15 NOTE — Progress Notes (Signed)
PROGRESS NOTE                                                 Gean Quint MD, [place], CMD                                                             [phone]    Date Time: 05/15/2013 2:26 PM  Patient Name: Jane [name], Jane [name]  Patient status: Inpatient  Patient hospital day: 2          Assessment:   Chest pain with exertional dyspnea  Generalized weakness  Cold intolerance  Depression  Gait disorder  hyponatremia  Hx HTN  constipation  Hx hyperlipidemia  Plan:   Cardiology following  NST today: partially reversible anterior, anteroseptal, septal and inferlat perfusion defect compat with local infarct/moderate surrounding ischemia  Further recs/workup per cardiology  Aspirin  Beta blocker  statin  Echocardiogram  Lipid panel  TFTs  PT/OT evaluation  Vitamin B12  Folate  Psych consultation  Increase zoloft  MOM prn, colace daily for constipation  Am labs  DVT prophylaxis  Subjective:   C/o generalized weakness, fatigue    Medications:     Current Facility-Administered Medications   Medication Dose Route Frequency   . aspirin EC  81 mg Oral Daily   . calcium citrate-Vitamin D  1 tablet Oral Daily   . docusate sodium  100 mg Oral Daily   . fluticasone-salmeterol  1 puff Inhalation BID   . heparin (porcine)  5,000 Units Subcutaneous Q12H SCH   . magnesium hydroxide  30 mL Oral Daily   . metoprolol  25 mg Oral Q12H SCH   . [COMPLETED] regadenoson       . [START ON 05/16/2013] sertraline  100 mg Oral Daily   . simvastatin  20 mg Oral QHS   . vitamins/minerals  1 tablet Oral Daily   . [DISCONTINUED] sertraline  50 mg Oral Daily       Review of Systems:    General ROS: no fever, no chills, no rigor, depressed   ENT ROS: negative   Endocrine ROS:+ fatigue, cold intolerance, generalized weakness   Respiratory ROS: no cough,   Cardiovascular ROS:+chest pain and dyspnea on exertion   Gastrointestinal ROS: no abdominal pain, change in bowel habits, or black or bloody stools, + constipation   Genito-Urinary ROS: no dysuria,  trouble voiding, or hematuria   Musculoskeletal ROS: negative   Neurological ROS: no TIA or stroke symptoms, no encephalopathy   Dermatological ROS: negative, no rash, no ulcer    Physical Exam:     Filed Vitals:    05/15/13 0744   BP:    Pulse:    Temp:    Resp:    SpO2: 98%   temp 97.2, rr 16 pulse 70s, bp 139/65      Intake/Output Summary (Last 24 hours) at 05/15/13 1426  Last data filed at 05/15/13 0600   Gross per 24 hour   Intake    340 ml   Output      0 ml   Net    340 ml       General  appearance - alert,ill appearing, and in no distress  Mental status - alert, oriented to person, place, and time, primarily hindi speaking  Eyes - pupils equal and reactive, extraocular eye movements intact  Nose - normal and patent, no erythema, discharge or polyps  Mouth - mucous membranes moist,  Neck - Supple, no JVD, no thyromegaly  Lymphatics - no  lymphadenopathy  Chest - clear to auscultation, no wheezes, rales or rhonchi, symmetric air entry  Heart - normal rate, regular rhythm, normal S1, S2, no murmurs,  Abdomen - soft, nontender, nondistended, no masses or organomegaly, bowel sounds normal  Neurological - alert, oriented, normal speech, no focal findings or movement disorder noted, equal strength 5/5 UE/LE bilateral, no facial asymmetry  Extremities - peripheral pulses normal, no pedal edema, no clubbing or cyanosis  Skin - normal coloration and turgor, no rashes, no suspicious skin lesions noted    Labs:     CBC w/Diff CMP     Lab 05/13/13 2059   WBC 8.17   HGB 13.6   HCT 38.3   PLT 345   MCV 85.5   NEUTRO 63       PT/INR   No results found for this basename: INR:3 in the last 168 hours      Lab 05/13/13 2035   NA 130*   K 4.3   CL 96*   CO2 23   BUN 11.0   CREAT 0.8   GLU 91   CA 9.7   MG --   PHOS --   PROT 6.6   ALB 3.8   AST 22   ALT 14   ALKPHOS 35*   BILITOTAL 0.6      Glucose POCT     Lab 05/13/13 2035   GLU 91          Lab 05/14/13 0811 05/14/13 0205 05/13/13 2059 05/13/13 2035   CK 49 52 -- 56   TROPI  0.05 0.04 0.02 --   TROPT -- -- -- --   CKMBINDEX -- -- -- --         ABGs:    No results found for this basename: ABGCOLLECTIO, ALLENSTEST, PHART, PCO2ART, PO2ART, HCO3ART, BEART, O2SATART       Urinalysis    Lab 05/14/13 0205   URINETYPE Clean Catch   COLORUA Yellow   CLARITYUA Clear   SPECGRAVUA 1.006   URINEPH 8.0   NITRITEUA Negative   PROTEINUA Negative   GLUUA --   KETONESUA Negative   UROBILIUA Negative   BILIUA Negative   BLOODUA Negative   RBCUA 0 - 5   WBCUA 0 - 5   URINEBACTERI --   GRANCASTUA --         Rads:     Radiology Results (24 Hour)     Procedure Component Value Units Date/Time    CV Cardiac Stress Test Tracing Only [[id number]] Resulted:05/15/13 1230    Order Status:Completed  Updated:05/15/13 1422    Narrative:    DESCRIPTION OF PROCEDURE:  The patient underwent Lexiscan stress test.  She received Lexiscan 0.4 mg  IV, flushed by normal saline.  The patient did not experience chest pain.   There were no significant EKG changes from the baseline.    IMPRESSION:  Negative Lexiscan stress.          Erin Fulling, NP  05/15/2013  2:26 PM   Seen/examined by dr. Graciela Husbands plan of care discussed. In agreement with abovePt seen and examined by me. I  have reviewed the notes, assessments, and/or procedures performed by Nicholes Calamity, I concur with her/his documentation of Jane [name].

## 2013-05-16 LAB — HEMOLYSIS INDEX
Hemolysis Index: 13 (ref 0–18)
Hemolysis Index: 14 — ABNORMAL HIGH (ref 0–9)

## 2013-05-16 LAB — CBC AND DIFFERENTIAL
Basophils Absolute Automated: 0.05 (ref 0.00–0.20)
Basophils Automated: 1 %
Eosinophils Absolute Automated: 0.2 (ref 0.00–0.70)
Eosinophils Automated: 2 %
Hematocrit: 34.5 % — ABNORMAL LOW (ref 37.0–47.0)
Hgb: 11.9 g/dL — ABNORMAL LOW (ref 12.0–16.0)
Immature Granulocytes Absolute: 0.02
Immature Granulocytes: 0 %
Lymphocytes Absolute Automated: 1.47 (ref 0.50–4.40)
Lymphocytes Automated: 17 %
MCH: 29.6 pg (ref 28.0–32.0)
MCHC: 34.5 g/dL (ref 32.0–36.0)
MCV: 85.8 fL (ref 80.0–100.0)
MPV: 8 fL — ABNORMAL LOW (ref 9.4–12.3)
Monocytes Absolute Automated: 1.03 (ref 0.00–1.20)
Monocytes: 12 %
Neutrophils Absolute: 6.09 (ref 1.80–8.10)
Neutrophils: 69 %
Nucleated RBC: 0 (ref 0–1)
Platelets: 254 (ref 140–400)
RBC: 4.02 — ABNORMAL LOW (ref 4.20–5.40)
RDW: 13 % (ref 12–15)
WBC: 8.84 (ref 3.50–10.80)

## 2013-05-16 LAB — FOLATE: Folate: 15.9 ng/mL

## 2013-05-16 LAB — BASIC METABOLIC PANEL
Anion Gap: 8 (ref 5.0–15.0)
BUN: 14 mg/dL (ref 7.0–19.0)
CO2: 22 (ref 22–29)
Calcium: 9 mg/dL (ref 7.9–10.6)
Chloride: 104 (ref 98–107)
Creatinine: 0.8 mg/dL (ref 0.6–1.0)
Glucose: 115 mg/dL — ABNORMAL HIGH (ref 70–100)
Potassium: 4.3 (ref 3.5–5.1)
Sodium: 134 — ABNORMAL LOW (ref 136–145)

## 2013-05-16 LAB — MAGNESIUM: Magnesium: 2.1 mg/dL (ref 1.6–2.6)

## 2013-05-16 LAB — TSH: TSH: 2.45 (ref 0.35–4.94)

## 2013-05-16 LAB — VITAMIN B12: Vitamin B-12: 857 pg/mL (ref 211–911)

## 2013-05-16 LAB — PHOSPHORUS: Phosphorus: 3.8 mg/dL (ref 2.3–4.7)

## 2013-05-16 LAB — T4, FREE: T4 Free: 1.03 ng/dL (ref 0.70–1.48)

## 2013-05-16 LAB — T3, FREE: T3, Free: 2.2 pg/mL (ref 1.71–3.71)

## 2013-05-16 LAB — LIPID PANEL
Cholesterol / HDL Ratio: 2.9
Cholesterol: 120 mg/dL (ref 0–199)
HDL: 42 mg/dL (ref >40)
LDL Calculated: 59 mg/dL (ref 0–99)
Triglycerides: 96 mg/dL (ref 34–149)
VLDL Calculated: 19 mg/dL (ref 10–40)

## 2013-05-16 LAB — GFR: EGFR: >60

## 2013-05-16 NOTE — Plan of Care (Signed)
Problem: Physical Therapy  Goal: Patient condition is improving per Physical Therapy Treatment Plan  Outcome: Completed Date Met:  05/16/13  Please see Physical Therapy Evaluation.

## 2013-05-16 NOTE — Consults (Signed)
Service Date: 05/15/2013     Patient Type: I     CONSULTING PHYSICIAN: Keziyah Kneale Lovie Macadamia MD     REFERRING PHYSICIAN:      CONSULTING SERVICE:  Psychiatry.       HISTORY OF PRESENT ILLNESS:  This is an 79 year old lady of [place] origin, who was admitted to the  hospital following an event when she experienced chest pain while in the  car with the family members who were driving from [place].  The patient  underwent all the tests and no abnormalities of tests were noted.     PAST MEDICAL HISTORY:  Significant for hypertensive cardiovascular disorder, lung disease,  bronchitis, arthritis, syncopal episodes, GERD, poor ambulation, hearing  loss, and vision abnormalities.     PAST PSYCHIATRIC HISTORY:    None acute, however, patient is noted to have dementia symptoms and anxiety  symptoms.     SOCIAL HISTORY:   The patient is widowed.  No history of alcohol, substance, or smoking was  noted.  The patient has 3 boys and 3 girls.  They are all well settled,  lives with family members.     MENTAL STATUS EXAMINATION:  A well-developed, pleasant lady of [place] origin, some of her family  members are at the bedside.  The patient is alert, not oriented to place  and person, does not remember when she came in and what is the reason.   Appeared to have impaired cognitive and memory functions, mainly to the  recent events.  The patient is not suicidal, not homicidal; however,  accessing the thought of death and dying, but no active suicidal plans.   Her insight and judgment appeared to be fair to impaired.     DIAGNOSTIC IMPRESSION:  AXIS I:  1.  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  2.  Dementia, Alzheimer's type.  AXIS II:  Deferred, 799.90.  AXIS III:  Hypertensive cardiovascular disorder, lung disease, bronchitis,  arthritis, gastroesophageal reflux disease, difficulty in ambulation,  hearing loss, age-related, multiple medical problems.  AXIS IV:  Moderate to severe.  AXIS V:  30 to 40.     PLAN AND  RECOMMENDATION:  The patient currently taking Zoloft 100 mg in the morning, may continue the  current medications.  No new medication is recommended at this time.           D:  05/15/2013 20:38 PM by Dr. Laury Axon. Elpidio Anis, MD ([id number])  T:  05/16/2013 01:16 AM by [id number]      Everlean Cherry: [id number]) (Doc ID: [id number])

## 2013-05-16 NOTE — Progress Notes (Signed)
Severe Sepsis Screen  Date: 05/16/2013 Time: 8:57 PM  Nurse Signature: Brittanee Ghazarian    A. Infection:    Does your patient have ONE or more of the following infection criteria?     []  Documented Infection - Does the patient have positive culture results (from blood,        sputum, urine, etc)?   []  Anti-Infective Therapy - Is the patient receiving antibiotic, antifungal, or other                anti-infective therapy?   []  Pneumonia - Is there documentation of pneumonia (X Ray, etc)?   []  WBC's - Have WBC's been found in normally sterile fluid (urine, CSF, etc.)?   []  Perforated Viscus -Does the patient have a perforated hollow organ (bowel)?    A.  Did you check any of the boxes above?    [x]  No  If No, Stop Here and Sepsis Screen Negative.               []  Yes, continue to section B      B. SIRS:     Does your patient have TWO or more of the following SIRS criteria (ensure vital signs & temperature are within 1 hour of this screening)?    []  Temperature - Is the patient's temperature: Temp: 98.1 F (36.7 C) (05/16/13 1919)   - Greater than or equal to 38.3 degrees C (greater than 100.9 degrees F)?   - Less than or equal to 36 degrees C (less than or equal to 96.8 degrees F)?    []  Heart Rate: Heart Rate: 75  (05/16/13 1951)   - Is the patient's heart rate greater than or equal to 90 bpm?    []  Respiratory: Resp Rate: 16  (05/16/13 1919)   - Is the patient's respiratory rate greater than or equal to 20?    []  WBC Count - Is the patient's WBC count:   Lab 05/16/13 0433   WBC 8.84      - Greater than or equal to 12,000/mm3 OR   - Less than or equal to 4,000/mm3 OR    - Are there greater than 10% immature neutrophils (bands)?    []  Glucose >140 without diabetes?   Lab 05/16/13 0433   GLU 115*       []  Significant edema?    B.  Did you check two or more of the boxes above?     [x]  No, Stop Here and Sepsis Screen Negative   []  Yes, continue to section C    C.  Acute Organ Dysfunction     Does your patient have ONE or more  of the following organ dysfunction? (May need to wait for lab results for assessment - see below) Organ dysfunction must be a result of the sepsis not chronic conditions.    []  Cardiovascular - Does the patient have a: BP: 165/69 mmHg (05/16/13 1951)   -systolic Blood pressure less than or equal to 90 mmHg OR   -systolic blood pressure has dropped 40 mmHg or more from baseline OR   -mean arterial pressure less than or equal to 70 mmHg (for at least one hour   despite fluid resuscitation OR require vasopressor support?  []  Respiratory - Does the patient have new hypoxia defined by any of the following"   -A sustained increase in oxygen requirements by at least 2L/min on NC or 28%    FiO2 within the last 24 hrs OR   -  A persistent decrease in oxygen saturation of greater than or equal to 5% lasting   at least four or more hours and occurring within the last 24 hours  []  Renal - Does the patient have:   -low urine output (e.g. Less than 0.79mL/kg/HR for one hour despite adequate fluid    resuscitation, OR   -Increased creatinine (greater than 50% increase from baseline) OR   -require acute dialysis?  []  Hematologic - Does the patient have a:   -Low platelet count (less than 100,000 mm3)   Lab 05/16/13 0433   PLT 254   OR   -INR/aPTT greater than upper limit of normal?No results found for this basename: INR:1 in the last 168 hours or                No results found for this basename: APTT:1 in the last 168 hours  []  Metabolic - Does the patient have a high lactate (plasma lactate greater than or equal to 2.4 mMol/L)? No results found for this basename: LACTATE:5 in the last 168 hours    []  Hepatic - Are the patient's liver enzymes elevated (ALT greater than 72 IU/L or Total      Bilirubin greater than 2 MG/dL)?    Lab 05/13/13 2035   BILITOTAL 0.6   ALT 14     []  CNS - Does the patient have altered consciousness or reduced Glasgow Coma      Scale?    C.  Did you check any of the boxes above?     [x]  No, Sepsis Screen  Negative   []  YES:  A) Infection + B) SIRS + C) Organ Dysfunction = Positive Screen for Severe Sepsis     Notify MD and document in Complex Assessment under provider notification   - Name of physician notified:                                           - Date/Time Notifiied:                                             Document actions: Following must be completed within 1 hour of positive sepsis screen   []  Lactate drawn (if initial lactate > , repeat lactate in 2 hours for goal decrease 10-20%)   []  Blood Cultures obtained (prior to antibiotic administration; if not done within the last 48 hours)   []  Antibiotics initiated or modified    []   IV Fluid administered 0.9% NS __________ mLs given (Initial Bolus of 30 ml/kg if SBP < 90 or MAP < 65 or lactate greater than 4 mmol/dl)  Nursing Comments?:     _________________________________________________________________    Patients meeting the following criteria are excluded from screening (check if applicable):   []  [place] hypothermia protocol   []  Comfort Care orders   []  Surgery- No screening for 24 hours after surgery (48 hours after CV surgery)       -If Yes. Date of surgery:______________________   []  Admitted with sepsis and until 72 hours after admission with documented sepsis (RESUME SEPSIS SCREEN AFTER 72 hour window!!!)      -If Yes, Date of Documented Sepsis:______________________   []  Positive screen AND Completed sepsis bundle during previous 24 hours       -  If Yes, Date/Time of positive severe sepsis screen:_______________________

## 2013-05-16 NOTE — Plan of Care (Signed)
Problem: Occupational Therapy  Goal: Patient condition is improving per Occupational Therapy Treatment Plan  Outcome: Completed Date Met:  05/16/13  Please see Occupational Therapy Evaluation.

## 2013-05-16 NOTE — OT Eval Note (Signed)
[facility]   Occupational Therapy Evaluation and Treatment    Patient: Jane [name]     MRN#: [id number]   Unit: [facility]  Bed: X5284/X3244-01    Time of Evaluation:  Time Calculation  OT Received On: 05/16/2013  Start Time: 1033  Stop Time: 1041  Time Calculation (min): 8        Time of Treatment:  Time Calculation  OT Received On: 05/16/2013  Start Time: 1042  Stop Time: 1105  Time Calculation (min): 23        Consult received for Jane [name] for OT Evaluation and Treatment.  Patient's medical condition is appropriate for Occupational therapy intervention at this time.    Activity Orders: not specified   Precautions and Contraindications:   Precautions  Weight Bearing Status: no restrictions  Other Precautions: falls    Medical Diagnosis: Weakness [780.79]  Abnormal EKG [794.31]  Chest pain [786.50]  142335  Hypertension  242335  141811  Weakness generalized  027253    History of Present Illness: Jane [name] is a 79 y.o. female admitted on 05/13/2013 after experienced chest pain while in the car with the family members who were driving from [place].    Patient Active Problem List   Diagnosis   . Hypertension   . Weakness generalized        Past Medical/Surgical History:  Past Medical History   Diagnosis Date   . Arthritis    . Hypertensive disorder    . Lung disease      Bronchitis   . Syncope and collapse      Weak with Wt. loss has difficulty walking   . GERD (gastroesophageal reflux disease)    . Difficulty in walking(719.7)    . Cataracts, bilateral    . Vision abnormalities    . Hearing loss NEC    . Depression       Past Surgical History   Procedure Date   . Upper gastrointestinal endoscopy          X-Rays/Tests/Labs:  Lab Results   Component Value Date/Time    HGB 11.9* 05/16/2013  4:33 AM    HCT 34.5* 05/16/2013  4:33 AM    K 4.3 05/16/2013  4:33 AM    NA 134* 05/16/2013  4:33 AM    TROPI 0.05 05/14/2013  8:11 AM    TROPI 0.04 05/14/2013  2:05 AM    TROPI 0.02  05/13/2013  8:59 PM       Social History:  Prior Level of Function  Prior level of function: Ambulates with assistive device;Needs assistance with ADL's (continuous assistance needed per daughter)  Assistive Device: Single point cane  Baseline Activity Level: Household ambulation  Driving: does not drive  Dressing - Upper Body:  (assist needed)  Dressing - Lower Body:  (assist needed)  Bathing:  (assist needed)    Home Living Arrangements  Living Arrangements: Family members  Type of Home: House  Home Layout: Multi-level;Able to live on main level with bedroom/bathroom  Bathroom Shower/Tub: Tub/shower unit  Bathroom Toilet: Standard  Bathroom Equipment: Shower chair    Subjective:   Patient is agreeable to participation in the therapy session. Nursing clears patient for therapy.   Patient Goal: get stronger  Pain Assessment  Pain Assessment: No/denies pain      Objective:      Observation of Patient/Vital Signs:vss  Patient received in bed with Telemetry and Intravenous (IV) in place.  Cognitive Status and Neuro Exam:  Cognition  Arousal/Alertness: Appropriate responses to stimuli  Attention Span: Appears intact  Orientation Level: Oriented X4  Memory: Appears intact  Following Commands: independent  Safety Awareness: independent  Insights: Fully aware of deficits  Problem Solving: Able to problem solve independently  Neuro Status  Behavior: calm cooperative  Motor Planning: intact  Coordination: intact  Hand Dominance: right handed  Safety Awareness: intact       Musculoskeletal Examination  Gross ROM  Neck/Trunk ROM: within functional limits  Right Upper Extremity ROM: within functional limits  Left Upper Extremity ROM: within functional limits  Gross Strength  Right Upper Extremity Strength: within functional limits  Left Upper Extremity Strength: within functional limits  Right Lower Extremity Strength: needs focused assessment  Left Lower Extremity Strength: needs focused assessment       Sensory/Oculomotor  Examination  Sensory  Auditory: intact  Tactile - Light Touch: intact  Visual Acuity: intact         Activities of Daily Living  Self-care and Home Management  Eating: Setup;in a chair  Grooming: stand by assistance;standing at sink  UE Dressing: stand by assistance  LE Dressing: standby assistance  Toileting: independently  Functional Transfers: stand by assistance (with RW)    Functional Mobility:  Mobility and Transfers  Scooting to [facility]: Stand by assistance  Supine to Sit: Stand by assistance  Sit to Supine: Stand by assistance  Sit to Stand:  (CG A/ SBA)  Functional Mobility/Ambulation: Stand by assistance (with RW, CG A without AD (hand held assist))     Balance  Balance  Static Sitting Balance: good  Dyanamic Sitting Balance: good  Static Standing Balance: good (with RW)  Dynamic Standing Balance: good (with RW)    Participation and Activity Tolerance  Participation and Endurance  Participation Effort: good  Endurance: Tolerates < 10 min exercise, no significant change in vital signs (decreased endurance)      Educated the patient to role of occupational therapy, plan of care, goals of therapy and safety with mobility and ADLs, home safety.    Patient left in bed with call bell within reach. RN notified of session outcome    Assessment: Jane [name] is a 79 y.o. female admitted 05/13/2013 presenting with the following Impairments: Assessment: balance deficits;decreased independence with ADLs;decreased independence with IADLs (decreased endurance)  Pt would benefit from skilled Occupational Therapy to max I with functional mob and self care.     Treatment:  Treatment followed immediately after evaluation. Pt and family educated on home safety and management, safety with ADLs and importance of activity especially OOB activity. Pt and family encouraged to support pt's Independence with self care and diminish self limitation. Pt and family verbalized and demo'd understanding.     Rehabilitation Potential:  Prognosis: Good    Plan: OT Frequency Recommended: 2-3x/wk   Treatment Interventions: ADL retraining;Functional transfer training;Endurance training;Patient/Family training;Equipment eval/education     Risks/benefits/POC discussed    Goals: Time For Goal Achievement: by time of discharge  ADL Goals  Patient will groom self: independently  Patient will dress upper body: independently  Patient will dress lower body: independently  Mobility and Transfer Goals  Pt will make functional transfers: modified independently                               DME Recommended for Discharge: Tub transfer bench  Discharge Recommendation: Home with supervision (24/7, HH f/u)  Caryn Section, OTR/L [phone]  05/16/2013 2:27 PM

## 2013-05-16 NOTE — Progress Notes (Signed)
Severe Sepsis Screen  Date: 05/16/2013 Time: 12:09 AM  Nurse Signature: Brandt Loosen Nusayba Cadenas    A. Infection:    Does your patient have ONE or more of the following infection criteria?     []  Documented Infection - Does the patient have positive culture results (from blood,        sputum, urine, etc)?   []  Anti-Infective Therapy - Is the patient receiving antibiotic, antifungal, or other                anti-infective therapy?   []  Pneumonia - Is there documentation of pneumonia (X Ray, etc)?   []  WBC's - Have WBC's been found in normally sterile fluid (urine, CSF, etc.)?   []  Perforated Viscus -Does the patient have a perforated hollow organ (bowel)?    A.  Did you check any of the boxes above?    [x]  No  If No, Stop Here and Sepsis Screen Negative.               []  Yes, continue to section B      B. SIRS:     Does your patient have TWO or more of the following SIRS criteria (ensure vital signs & temperature are within 1 hour of this screening)?    []  Temperature - Is the patient's temperature: Temp: 96.9 F (36.1 C) (05/15/13 2338)   - Greater than or equal to 38.3 degrees C (greater than 100.9 degrees F)?   - Less than or equal to 36 degrees C (less than or equal to 96.8 degrees F)?    []  Heart Rate: Heart Rate: 72  (05/15/13 2338)   - Is the patient's heart rate greater than or equal to 90 bpm?    []  Respiratory: Resp Rate: 17  (05/15/13 2338)   - Is the patient's respiratory rate greater than or equal to 20?    []  WBC Count - Is the patient's WBC count:   Lab 05/13/13 2059   WBC 8.17      - Greater than or equal to 12,000/mm3 OR   - Less than or equal to 4,000/mm3 OR    - Are there greater than 10% immature neutrophils (bands)?    []  Glucose >140 without diabetes?   Lab 05/13/13 2035   GLU 91       []  Significant edema?    B.  Did you check two or more of the boxes above?     [x]  No, Stop Here and Sepsis Screen Negative   []  Yes, continue to section C    C.  Acute Organ Dysfunction     Does your patient have ONE or more  of the following organ dysfunction? (May need to wait for lab results for assessment - see below) Organ dysfunction must be a result of the sepsis not chronic conditions.    []  Cardiovascular - Does the patient have a: BP: 123/58 mmHg (05/15/13 2338)   -systolic Blood pressure less than or equal to 90 mmHg OR   -systolic blood pressure has dropped 40 mmHg or more from baseline OR   -mean arterial pressure less than or equal to 70 mmHg (for at least one hour   despite fluid resuscitation OR require vasopressor support?  []  Respiratory - Does the patient have new hypoxia defined by any of the following"   -A sustained increase in oxygen requirements by at least 2L/min on NC or 28%    FiO2 within the last 24 hrs OR   -  A persistent decrease in oxygen saturation of greater than or equal to 5% lasting   at least four or more hours and occurring within the last 24 hours  []  Renal - Does the patient have:   -low urine output (e.g. Less than 0.33mL/kg/HR for one hour despite adequate fluid    resuscitation, OR   -Increased creatinine (greater than 50% increase from baseline) OR   -require acute dialysis?  []  Hematologic - Does the patient have a:   -Low platelet count (less than 100,000 mm3)   Lab 05/13/13 2059   PLT 345   OR   -INR/aPTT greater than upper limit of normal?No results found for this basename: INR:1 in the last 168 hours or                No results found for this basename: APTT:1 in the last 168 hours  []  Metabolic - Does the patient have a high lactate (plasma lactate greater than or equal to 2.4 mMol/L)? No results found for this basename: LACTATE:5 in the last 168 hours    []  Hepatic - Are the patient's liver enzymes elevated (ALT greater than 72 IU/L or Total      Bilirubin greater than 2 MG/dL)?    Lab 05/13/13 2035   BILITOTAL 0.6   ALT 14     []  CNS - Does the patient have altered consciousness or reduced Glasgow Coma      Scale?    C.  Did you check any of the boxes above?     [x]  No, Sepsis Screen  Negative   []  YES:  A) Infection + B) SIRS + C) Organ Dysfunction = Positive Screen for Severe Sepsis     Notify MD and document in Complex Assessment under provider notification   - Name of physician notified:                                           - Date/Time Notifiied:                                             Document actions: Following must be completed within 1 hour of positive sepsis screen   []  Lactate drawn (if initial lactate > , repeat lactate in 2 hours for goal decrease 10-20%)   []  Blood Cultures obtained (prior to antibiotic administration; if not done within the last 48 hours)   []  Antibiotics initiated or modified    []   IV Fluid administered 0.9% NS __________ mLs given (Initial Bolus of 30 ml/kg if SBP < 90 or MAP < 65 or lactate greater than 4 mmol/dl)  Nursing Comments?:     _________________________________________________________________    Patients meeting the following criteria are excluded from screening (check if applicable):   []  [place] hypothermia protocol   []  Comfort Care orders   []  Surgery- No screening for 24 hours after surgery (48 hours after CV surgery)       -If Yes. Date of surgery:______________________   []  Admitted with sepsis and until 72 hours after admission with documented sepsis (RESUME SEPSIS SCREEN AFTER 72 hour window!!!)      -If Yes, Date of Documented Sepsis:______________________   []  Positive screen AND Completed sepsis bundle during previous 24 hours       -  If Yes, Date/Time of positive severe sepsis screen:_______________________

## 2013-05-16 NOTE — Progress Notes (Signed)
PROGRESS NOTE    Date Time: 05/16/2013 6:55 PM  Patient Name: Jane [name],Jane [name]      Assessment:       1. Chest pain, rule out myocardial infarction.   2. Hypertension. Blood pressure is under control.   3. Gastroesophageal reflux disease.   4. Fatigue.   5. Abnormal stress test.    RECOMMENDATIONS:   1. Cath/ PCI v/s medical tx was d/w the patient and her daughter..   2. Echo.   3. Patient was also d/w Dr. Graciela Husbands..   4. Out of bed and ambulate.   5. Aspirin.   6. Nitrates.   7. Beta blockers.       Subjective/chief complaint:   Patient is seen and examined  All medications and labs  reviewed    Medications:     Current Facility-Administered Medications   Medication Dose Route Frequency   . aspirin EC  81 mg Oral Daily   . calcium citrate-Vitamin D  1 tablet Oral Daily   . docusate sodium  100 mg Oral Daily   . fluticasone-salmeterol  1 puff Inhalation BID   . heparin (porcine)  5,000 Units Subcutaneous Q12H SCH   . magnesium hydroxide  30 mL Oral Daily   . metoprolol  25 mg Oral Q12H SCH   . nitroglycerin  1 patch Transdermal Q24H   . sertraline  100 mg Oral Daily   . simvastatin  20 mg Oral QHS   . vitamins/minerals  1 tablet Oral Daily       Review of Systems:   General ROS: no weight loss, no weight gain, no fever, no chills  Hematological and Lymphatic ROS: negative  Endocrine ROS: + fatigue, no polyuria, no polydipsia, no general weakness  Respiratory ROS: no shortness of breath, no cough, no wheezes and no hemoptysis  Cardiovascular ROS: +chest pain/ off and on, no palpitations, no PND and no orthopnea, +DOE  Gastrointestinal ROS:no nausea, no vomiting, no diarrhea, no constipation, no blood in stool and no abdominal pain  Musculoskeletal ROS: no muscle pain, + muscle weakness, no joint pain, no swelling and no redness  Neurological ROS: no headache, no dizziness, no diplopia, no focal weakness, no seizure  Dermatological ROS: no rash, no itching and no ecchymoses, no pressure ulcer      Physical Exam:   BP  143/62  Pulse 75  Temp 96.8 F (36 C) (Oral)  Resp 18  Ht 1.448 Jane [name] (4\' 9" )  Wt 53.343 kg (117 lb 9.6 oz)  BMI 25.44 kg/m2  SpO2 98%      Intake/Output Summary (Last 24 hours) at 05/16/13 1855  Last data filed at 05/16/13 0500   Gross per 24 hour   Intake    320 ml   Output      0 ml   Net    320 ml       General appearance - alert, well appearing, and in no distress  Mental status - alert, oriented to person, place, and time  HEENT:normocephalic, EOMI, PERRL, no icterus, no pallor, no cyanosis  Neck: supple, no JVD, no thyromegaly, no carotid bruits  Chest - crackles to  to auscultation, no wheezes, rales or rhonchi, symmetric air entry  Heart - normal rate, regular rhythm, normal S1, S2, no S3 ,+murmurs, rubs, clicks or gallops  Abdomen - soft, nontender, nondistended, no masses or organomegaly  Neurological - alert, oriented, normal speech, no focal findings  noted  Musculoskeletal - no joint tenderness, deformity or swelling  seen.  Extremities - peripheral pulses normal, no pedal edema, no clubbing or cyanosis  Skin - normal color , no rashes.    Labs:     CBC w/Diff     Lab 05/16/13 0433 05/13/13 2059   WBC 8.84 8.17   HGB 11.9* 13.6   HCT 34.5* 38.3   PLT 254 345          Basic Metabolic Profile     Lab 05/16/13 0433 05/13/13 2035   NA 134* 130*   K 4.3 4.3   CL 104 96*   CO2 22 23   BUN 14.0 11.0   CREAT 0.8 0.8   CA 9.0 9.7   ALT -- 14   AST -- 22   GLU 115* 91          Cardiac Enzymes     Lab 05/14/13 0811 05/14/13 0205 05/13/13 2059 05/13/13 2035   CK 49 52 -- 56   TROPI 0.05 0.04 0.02 --   TROPT -- -- -- --   CKMBINDEX -- -- -- --       Lab Results   Component Value Date    BNP 61 05/13/2013          Thyroid Studies      Lab 05/16/13 0433   TSH 2.45   FREET3 2.20           Lipid Profile     Lab 05/16/13 0433   CHOL 120          Radiology Results (24 Hour)     ** No Results found for the last 24 hours. Sande Brothers, MD  05/16/2013 6:55 PM

## 2013-05-16 NOTE — Plan of Care (Addendum)
Problem: Inadequate Tissue Perfusion  Goal: Adequate tissue perfusion will be maintained  Outcome: Progressing  Pt is alert a oriented 3,  encouraged  to ambulate as tolerated, positioned for adequate circulation, monitored vitals, will have PT/OT evaluation today,  Pt.slept well at night.

## 2013-05-16 NOTE — PT Eval Note (Signed)
[facility]  Physical Therapy Evaluation and Treatment    Patient: Jane [name]     MRN#: [id number]   Unit: [facility]  Bed: V2536/U4403-47    Time of Evaluation:  Time Calculation  PT Received On: 05/16/2013  Start Time: 1135  Stop Time: 1146      Time of Treatment:  Time Calculation  PT Received On: 05/16/2013  Start Time: 1147  Stop Time: 1155  Time Calculation (min): 20    Consult received for Eddye P Reisner for PT Evaluation and Treatment.  Patient's medical condition is appropriate for Physical therapy intervention at this time.    Activity Orders: none specified    Precautions and Contraindications:   Precautions  Weight Bearing Status: no restrictions  Other Precautions: falls    Medical Diagnosis: Weakness [780.79]  Abnormal EKG [794.31]  Chest pain [786.50]  142335 [id number]  141811 Weakness [id number]    History of Present Illness: Toni P Counsell is a 79 y.o. female admitted on 05/13/2013 with chest pain, SOB. S/p stress test w/ cardiac ischemia    Patient Active Problem List   Diagnosis   . Hypertension   . Weakness generalized        Past Medical/Surgical History:  Past Medical History   Diagnosis Date   . Arthritis    . Hypertensive disorder    . Lung disease      Bronchitis   . Syncope and collapse      Weak with Wt. loss has difficulty walking   . GERD (gastroesophageal reflux disease)    . Difficulty in walking(719.7)    . Cataracts, bilateral    . Vision abnormalities    . Hearing loss NEC    . Depression       Past Surgical History   Procedure Date   . Upper gastrointestinal endoscopy          X-Rays/Tests/Labs:  Lab Results   Component Value Date/Time    HGB 11.9* 05/16/2013  4:33 AM    HCT 34.5* 05/16/2013  4:33 AM    K 4.3 05/16/2013  4:33 AM    NA 134* 05/16/2013  4:33 AM    TROPI 0.05 05/14/2013  8:11 AM    TROPI 0.04 05/14/2013  2:05 AM    TROPI 0.02 05/13/2013  8:59 PM         Social History:  Prior Level of Function  Prior level of function: Ambulates  with assistive device;Needs assistance with ADL's (continuous assistance needed per daughter)  Assistive Device: Single point cane  Baseline Activity Level: Household ambulation    Home Living Arrangements  Living Arrangements: Family members  Type of Home: House  Home Layout: Multi-level;Able to live on main level with bedroom/bathroom  Bathroom Shower/Tub: Tub/shower unit  Bathroom Toilet: Standard  Bathroom Equipment: Shower chair    Subjective: Patient is agreeable to participation in the therapy session.   Patient Goal: Home with family  Pain Assessment  Pain Assessment: No/denies pain    Objective:  Observation of Patient/Vital Signs:vss    Patient received in bed with Telemetry in place.         Cognitive Status and Neuro Exam:  Cognition  Arousal/Alertness: Appropriate responses to stimuli  Attention Span: Appears intact  Memory: Appears intact  Following Commands: Follows one step commands without difficulty  Safety Awareness: minimal verbal instruction  Insights: Fully aware of deficits  Problem Solving: Assistance required to identify errors made  Neuro Status  Behavior: calm cooperative  Motor Planning: decreased processing speed  Coordination: GMC impaired    Musculoskeletal Examination  Gross ROM  Right Lower Extremity ROM: within functional limits  Left Lower Extremity ROM: within functional limits  Gross Strength  Right Lower Extremity Strength: needs focused assessment  R Hip Flexion: 3+/5  R Knee Flexion: 3+/5  R Knee Extension: 3+/5  R Ankle Dorsiflexion: 3+/5  R Ankle Plantar Flexion: 3+/5  Left Lower Extremity Strength: needs focused assessment  L Hip Flexion: 3+/5  L Knee Flexion: 3/5  L Knee Extension: 3+/5  L Ankle Dorsiflexion: 3+/5  L Ankle Plantar Flexion: 3+/5       Functional Mobility:  Functional Mobility  Rolling: Stand by assistance (w/ bedrails)  Supine to Sit: Stand by assistance (w/ bedrails)  Scooting: Stand by assistance  Sit to Supine: Stand by assistance  Sit to Stand:   (CGA)  Stand to Sit:  (CGA)     Locomotion  Ambulation: stand by assistance (w/ SPC, pt demos no increase in safety w/ RW v SPC)  Ambulation Distance (Feet): 200 Feet  Pattern: shuffle;decreased step length     Balance  Balance  Balance: needs focused assessment  Standing - Static: Fair (w/ SPC)  Standing - Dynamic: Fair (w/ SPC)    Participation and Activity Tolerance  Participation and Endurance  Participation Effort: good  Endurance: Tolerates < 10 min exercise, no significant change in vital signs (pt c/o fatigue)    Educated the patient to role of physical therapy, plan of care, goals of therapy and safety with mobility and ADLs.   Patient left in bed, call bell within reach. RN notified of session outcome.     Assessment: Jane [name] is a 79 y.o. female admitted 05/13/2013 .    Pt presents with the following impairments: Assessment: Decreased LE strength;Decreased endurance/activity tolerance;Decreased functional mobility;Decreased balance;Gait impairment.    Pt would benefit from Physical Therapy to MAXIMIZE balance, strength, safety, gait, activity tolerance AND FUNCTIONAL MOBILITY            Treatment: Demo'd HEP: AP, SLR, HS    Pt performed w/ tactile cues x 10 reps B.  D/w family to encourage participation in HEP 3x/day      Plan: Treatment/Interventions: Exercise;Gait training;Functional transfer training;Patient/family training   PT Frequency: 2-3x/wk   Risks/Benefits/POC Discussed with Pt/Family: With patient/family     Goals:   Goals  Goal Formulation: With patient/family  Time for Goal Acheivement: 3 visits  Goals: Select goal  Pt Will Go Supine To Sit: Independently  Pt Will Stand: Independently  Pt Will Demo Standing Balance: 4/5 indep, moves/returns center of grav 1-2"/1 plane  Pt Will Ambulate: > 200 feet;with single point cane;Independently  Pt Will Perform Home Exer Program: Independently        DME Recommended for Discharge:  (none, educated family to encourage use of SPC at all times.  Pt  does not require RW at this time.)  Discharge Recommendation: Home with home health PT    Jilda Panda PT DPT  [phone]  05/16/2013 1:23 PM

## 2013-05-16 NOTE — Plan of Care (Addendum)
Report given at bed side, plan of care discussed w/pt. on anticoagulant,  will have L/ cardiac cath tomorrow, NPO after midnight, Pt. verbalized understandings.

## 2013-05-16 NOTE — Plan of Care (Signed)
ISHAPE done. Family member on the bedside. Informed pt and family member about the POC; PTOT today, awaiting for ECHO result.    Severe Sepsis Screen  Date: 05/16/2013 Time: 11:03 AM  Nurse Signature: Saree Krogh A Waylynn Benefiel    A. Infection:    Does your patient have ONE or more of the following infection criteria?     []  Documented Infection - Does the patient have positive culture results (from blood,        sputum, urine, etc)?   []  Anti-Infective Therapy - Is the patient receiving antibiotic, antifungal, or other                anti-infective therapy?   []  Pneumonia - Is there documentation of pneumonia (X Ray, etc)?   []  WBC's - Have WBC's been found in normally sterile fluid (urine, CSF, etc.)?   []  Perforated Viscus -Does the patient have a perforated hollow organ (bowel)?    A.  Did you check any of the boxes above?    [x]  No  If No, Stop Here and Sepsis Screen Negative.               []  Yes, continue to section B      B. SIRS:     Does your patient have TWO or more of the following SIRS criteria (ensure vital signs & temperature are within 1 hour of this screening)?    []  Temperature - Is the patient's temperature: Temp: 97.3 F (36.3 C) (05/16/13 0743)   - Greater than or equal to 38.3 degrees C (greater than 100.9 degrees F)?   - Less than or equal to 36 degrees C (less than or equal to 96.8 degrees F)?    []  Heart Rate: Heart Rate: 75  (05/16/13 0743)   - Is the patient's heart rate greater than or equal to 90 bpm?    []  Respiratory: Resp Rate: 18  (05/16/13 0743)   - Is the patient's respiratory rate greater than or equal to 20?    []  WBC Count - Is the patient's WBC count:   Lab 05/16/13 0433   WBC 8.84      - Greater than or equal to 12,000/mm3 OR   - Less than or equal to 4,000/mm3 OR    - Are there greater than 10% immature neutrophils (bands)?    []  Glucose >140 without diabetes?   Lab 05/16/13 0433   GLU 115*       []  Significant edema?    B.  Did you check two or more of the boxes above?     []  No, Stop  Here and Sepsis Screen Negative   []  Yes, continue to section C    C.  Acute Organ Dysfunction     Does your patient have ONE or more of the following organ dysfunction? (May need to wait for lab results for assessment - see below) Organ dysfunction must be a result of the sepsis not chronic conditions.    []  Cardiovascular - Does the patient have a: BP: 138/66 mmHg (05/16/13 0743)   -systolic Blood pressure less than or equal to 90 mmHg OR   -systolic blood pressure has dropped 40 mmHg or more from baseline OR   -mean arterial pressure less than or equal to 70 mmHg (for at least one hour   despite fluid resuscitation OR require vasopressor support?  []  Respiratory - Does the patient have new hypoxia defined by any of the following"   -A  sustained increase in oxygen requirements by at least 2L/min on NC or 28%    FiO2 within the last 24 hrs OR   -A persistent decrease in oxygen saturation of greater than or equal to 5% lasting   at least four or more hours and occurring within the last 24 hours  []  Renal - Does the patient have:   -low urine output (e.g. Less than 0.22mL/kg/HR for one hour despite adequate fluid    resuscitation, OR   -Increased creatinine (greater than 50% increase from baseline) OR   -require acute dialysis?  []  Hematologic - Does the patient have a:   -Low platelet count (less than 100,000 mm3)   Lab 05/16/13 0433   PLT 254   OR   -INR/aPTT greater than upper limit of normal?No results found for this basename: INR:1 in the last 168 hours or                No results found for this basename: APTT:1 in the last 168 hours  []  Metabolic - Does the patient have a high lactate (plasma lactate greater than or equal to 2.4 mMol/L)? No results found for this basename: LACTATE:5 in the last 168 hours    []  Hepatic - Are the patient's liver enzymes elevated (ALT greater than 72 IU/L or Total      Bilirubin greater than 2 MG/dL)?    Lab 05/13/13 2035   BILITOTAL 0.6   ALT 14     []  CNS - Does the patient have  altered consciousness or reduced Glasgow Coma      Scale?    C.  Did you check any of the boxes above?     []  No, Sepsis Screen Negative   []  YES:  A) Infection + B) SIRS + C) Organ Dysfunction = Positive Screen for Severe Sepsis     Notify MD and document in Complex Assessment under provider notification   - Name of physician notified:                                           - Date/Time Notifiied:                                             Document actions: Following must be completed within 1 hour of positive sepsis screen   []  Lactate drawn (if initial lactate > , repeat lactate in 2 hours for goal decrease 10-20%)   []  Blood Cultures obtained (prior to antibiotic administration; if not done within the last 48 hours)   []  Antibiotics initiated or modified    []   IV Fluid administered 0.9% NS __________ mLs given (Initial Bolus of 30 ml/kg if SBP < 90 or MAP < 65 or lactate greater than 4 mmol/dl)  Nursing Comments?:     _________________________________________________________________    Patients meeting the following criteria are excluded from screening (check if applicable):   []  [place] hypothermia protocol   []  Comfort Care orders   []  Surgery- No screening for 24 hours after surgery (48 hours after CV surgery)       -If Yes. Date of surgery:______________________   []  Admitted with sepsis and until 72 hours after admission with documented sepsis (RESUME SEPSIS SCREEN AFTER 72 hour  window!!!)      -If Yes, Date of Documented Sepsis:______________________   '[]'$  Positive screen AND Completed sepsis bundle during previous 24 hours       -If Yes, Date/Time of positive severe sepsis screen:_______________________

## 2013-05-16 NOTE — Plan of Care (Signed)
Problem: Pain  Goal: Patient's pain/discomfort is manageable  Outcome: Progressing  Pt verbalized chest pain that comes and goes. Did not ask for any pain medication. Able to walk to the bathroom and around her room with standby assist. No acute distress noted.

## 2013-05-16 NOTE — Progress Notes (Signed)
PROGRESS NOTE                                                 Gean Quint MD, [place], CMD                                                             [phone]    Date Time: 05/16/2013 2:10 PM  Patient Name: Jane [name], Jane [name]  Patient status: Inpatient  Patient hospital day: 3          Assessment:   Chest pain with exertional dyspnea with positive NST partially reversible anterior, anteroseptal, septal and inferlat perfusion defect compat with local infarct/moderate surrounding ischemia, EF 73%  Generalized weakness  Cold intolerance, TFTs normal  Depression  Gait disorder  Hyponatremia, mild  Hx HTN  Mild anemia  Hx hyperlipidemia, lipid panel 120/42/59/96  Plan:   Cardiology following, ?cardiac cath, pt/family agreeable  Aspirin  Beta blocker  statin  Echocardiogram pending  PT/OT   Psych consultation appreciated  Continue zoloft at increased dosage  Am labs  DVT prophylaxis  Subjective:   C/o generalized weakness, fatigue, especially with ambulation    Medications:     Current Facility-Administered Medications   Medication Dose Route Frequency   . aspirin EC  81 mg Oral Daily   . calcium citrate-Vitamin D  1 tablet Oral Daily   . docusate sodium  100 mg Oral Daily   . fluticasone-salmeterol  1 puff Inhalation BID   . heparin (porcine)  5,000 Units Subcutaneous Q12H SCH   . magnesium hydroxide  30 mL Oral Daily   . metoprolol  25 mg Oral Q12H SCH   . nitroglycerin  1 patch Transdermal Q24H   . sertraline  100 mg Oral Daily   . simvastatin  20 mg Oral QHS   . vitamins/minerals  1 tablet Oral Daily   . [DISCONTINUED] sertraline  50 mg Oral Daily       Review of Systems:    General ROS: no fever, no chills, no rigor, depressed   ENT ROS: negative   Endocrine ROS:+ fatigue, generalized weakness   Respiratory ROS: no cough,   Cardiovascular ROS:+chest pain and dyspnea on exertion   Gastrointestinal ROS: no abdominal pain, change in bowel habits, or black or bloody stools,   Genito-Urinary ROS: no dysuria,  trouble voiding, or hematuria   Musculoskeletal ROS: negative   Neurological ROS: no TIA or stroke symptoms, no encephalopathy   Dermatological ROS: negative, no rash, no ulcer    Physical Exam:     Filed Vitals:    05/16/13 1221   BP: 143/62   Pulse: 75   Temp: 96.8 F (36 C)   Resp: 18   SpO2: 98%         Intake/Output Summary (Last 24 hours) at 05/16/13 1410  Last data filed at 05/16/13 0500   Gross per 24 hour   Intake    320 ml   Output      0 ml   Net    320 ml       General appearance - alert,ill appearing, and in no distress, pale  Mental status -  alert, oriented to person, place, and time, primarily hindi speaking  Eyes - pupils equal and reactive, extraocular eye movements intact  Nose - normal and patent, no erythema, discharge or polyps  Mouth - mucous membranes moist,  Neck - Supple, no JVD, no thyromegaly  Lymphatics - no  lymphadenopathy  Chest - clear to auscultation, no wheezes, rales or rhonchi, symmetric air entry  Heart - normal rate, regular rhythm, normal S1, S2, no murmurs,  Abdomen - soft, nontender, nondistended, no masses or organomegaly, bowel sounds normal  Neurological - alert, oriented, normal speech, no focal findings or movement disorder noted,   Extremities - peripheral pulses normal, no pedal edema, no clubbing or cyanosis  Skin - normal coloration and turgor, no rashes, no suspicious skin lesions noted    Labs:     CBC w/Diff CMP     Lab 05/16/13 0433 05/13/13 2059   WBC 8.84 8.17   HGB 11.9* 13.6   HCT 34.5* 38.3   PLT 254 345   MCV 85.8 85.5   NEUTRO 69 63       PT/INR   No results found for this basename: INR:3 in the last 168 hours      Lab 05/16/13 0433 05/13/13 2035   NA 134* 130*   K 4.3 4.3   CL 104 96*   CO2 22 23   BUN 14.0 11.0   CREAT 0.8 0.8   GLU 115* 91   CA 9.0 9.7   MG 2.1 --   PHOS 3.8 --   PROT -- 6.6   ALB -- 3.8   AST -- 22   ALT -- 14   ALKPHOS -- 35*   BILITOTAL -- 0.6      Glucose POCT     Lab 05/16/13 0433 05/13/13 2035   GLU 115* 91          Lab  05/14/13 0811 05/14/13 0205 05/13/13 2059 05/13/13 2035   CK 49 52 -- 56   TROPI 0.05 0.04 0.02 --   TROPT -- -- -- --   CKMBINDEX -- -- -- --         ABGs:    No results found for this basename: ABGCOLLECTIO,  ALLENSTEST,  PHART,  PCO2ART,  PO2ART,  HCO3ART,  BEART,  O2SATART       Urinalysis    Lab 05/14/13 0205   URINETYPE Clean Catch   COLORUA Yellow   CLARITYUA Clear   SPECGRAVUA 1.006   URINEPH 8.0   NITRITEUA Negative   PROTEINUA Negative   GLUUA --   KETONESUA Negative   UROBILIUA Negative   BILIUA Negative   BLOODUA Negative   RBCUA 0 - 5   WBCUA 0 - 5   URINEBACTERI --   GRANCASTUA --         Rads:     Radiology Results (24 Hour)     Procedure Component Value Units Date/Time    NM Myocardial Perfusion Spect (Stress And Rest) [[id number]] Collected:05/15/13 1442    Order Status:Completed  Updated:05/15/13 1452    Narrative:    LEXISCAN SPECT MYOVIEW  SAME DAY PROTOCOL    LEXISCAN MYOVIEW PERFUSION STUDY:    INDICATION: Chest pain    PROCEDURE: The patient was injected with 8 mCi of Myoview and SPECT  imaging was begun  at least 30 minutes post injection. The camera  obtained images with a 180-degree orbit around the patient's heart.  After the rest images, the patient was then prepped  for a regadenoson  pharmacologic stress test. The patient was given 5 ml (0.4mg ) of IV  regadenoson (Lexiscan) over a ten second rapid infusion with a five to  ten ml normal saline flush given immediately after the infusion. The  patient was then given an intravenous injection of 26.8 mCi of Myoview  followed by another five to ten ml saline flush. The patient was  monitored for approximately 5 minutes or until the patients EKG, BP, and  symptoms came back to baseline. . Gated SPECT imaging was started at  least 30 minutes post injection. The patient was then placed in the  supine position and the images were obtained with a 180-degree orbit  around the patient's heart while gating, when feasible, to the patient's  ECG. The  study was then processed and displayed for evaluation.    .    INTERPRETATION: Images were reviewed in the horizontal long axis,  vertical long axis, and short axis. Review of [facility] quantification  data and polar plots was also performed during this review.    The overall quality of the study is good. The left ventricular cavity is  noted to be normal on the rest and/or stress images. SPECT images  demonstrate a fairly extensive partially reversible anterior,  anteroseptal, septal and inferoseptal perfusion defect of moderate  severity. The rest images reveal no additional abnormality. Gated SPECT  imaging reveals mild anteroseptal hypokinesis. The calculated left  ventricular ejection fraction is 73%.      Impression:      Partially reversible anterior, anteroseptal, septal and inferoseptal  perfusion defect compatible with a focal infarct with moderate  surrounding ischemia    Laurena Slimmer, MD   05/15/2013 2:48 PM    CV Cardiac Stress Test Tracing Only [[id number]] Resulted:05/15/13 1230    Order Status:Completed  Updated:05/15/13 1434    Narrative:    DESCRIPTION OF PROCEDURE:  The patient underwent Lexiscan stress test.  She received Lexiscan 0.4 mg  IV, flushed by normal saline.  The patient did not experience chest pain.   There were no significant EKG changes from the baseline.    IMPRESSION:  Negative Lexiscan stress.          Erin Fulling, NP  05/16/2013  2:10 PM   Seen/examined by dr. Graciela Husbands plan of care discussed. In agreement with abovePt seen and examined by me. I have reviewed the notes, assessments, and/or procedures performed by Nicholes Calamity, I concur with her/his documentation of Cherae P Haith.

## 2013-05-17 ENCOUNTER — Encounter
Admission: EM | Disposition: A | Discharge status: Home Health | Payer: Self-pay | Source: Home / Self Care | Attending: Internal Medicine

## 2013-05-17 LAB — CBC AND DIFFERENTIAL
Basophils Absolute Automated: 0.04 (ref 0.00–0.20)
Basophils Automated: 0 %
Eosinophils Absolute Automated: 0.25 (ref 0.00–0.70)
Eosinophils Automated: 3 %
Hematocrit: 34.2 % — ABNORMAL LOW (ref 37.0–47.0)
Hgb: 12 g/dL (ref 12.0–16.0)
Immature Granulocytes Absolute: 0.02
Immature Granulocytes: 0 %
Lymphocytes Absolute Automated: 1.61 (ref 0.50–4.40)
Lymphocytes Automated: 21 %
MCH: 29.9 pg (ref 28.0–32.0)
MCHC: 35.1 g/dL (ref 32.0–36.0)
MCV: 85.3 fL (ref 80.0–100.0)
MPV: 8.3 fL — ABNORMAL LOW (ref 9.4–12.3)
Monocytes Absolute Automated: 0.77 (ref 0.00–1.20)
Monocytes: 10 %
Neutrophils Absolute: 4.99 (ref 1.80–8.10)
Neutrophils: 65 %
Nucleated RBC: 0 (ref 0–1)
Platelets: 269 (ref 140–400)
RBC: 4.01 — ABNORMAL LOW (ref 4.20–5.40)
RDW: 13 % (ref 12–15)
WBC: 7.68 (ref 3.50–10.80)

## 2013-05-17 LAB — GFR: EGFR: >60

## 2013-05-17 LAB — BASIC METABOLIC PANEL
Anion Gap: 6 (ref 5.0–15.0)
BUN: 13 mg/dL (ref 7.0–19.0)
CO2: 24 (ref 22–29)
Calcium: 8.7 mg/dL (ref 7.9–10.6)
Chloride: 104 (ref 98–107)
Creatinine: 0.8 mg/dL (ref 0.6–1.0)
Glucose: 93 mg/dL (ref 70–100)
Potassium: 4.3 (ref 3.5–5.1)
Sodium: 134 — ABNORMAL LOW (ref 136–145)

## 2013-05-17 LAB — HEMOLYSIS INDEX: Hemolysis Index: 6 (ref 0–18)

## 2013-05-17 SURGERY — LEFT HEART CATH POSS PCI
Laterality: Left

## 2013-05-17 MED ORDER — DIPHENHYDRAMINE HCL 50 MG/ML IJ SOLN
INTRAMUSCULAR | Status: AC
Start: 2013-05-17 — End: 2013-05-17
  Administered 2013-05-17: 25 mg via INTRAVENOUS
  Filled 2013-05-17: qty 1

## 2013-05-17 MED ORDER — IODIXANOL 320 MG/ML IV SOLN
60.00 mL | Freq: Once | INTRAVENOUS | Status: AC | PRN
Start: 2013-05-17 — End: 2013-05-17
  Administered 2013-05-17: 60 mL via INTRA_ARTERIAL

## 2013-05-17 MED ORDER — LIDOCAINE HCL (PF) 1 % IJ SOLN
INTRAMUSCULAR | Status: AC
Start: 2013-05-17 — End: 2013-05-17
  Administered 2013-05-17: 10 mL via SUBCUTANEOUS
  Filled 2013-05-17: qty 30

## 2013-05-17 MED ORDER — FENTANYL CITRATE 0.05 MG/ML IJ SOLN
INTRAMUSCULAR | Status: AC
Start: 2013-05-17 — End: 2013-05-17
  Administered 2013-05-17: 25 ug via INTRAVENOUS
  Filled 2013-05-17: qty 2

## 2013-05-17 MED ORDER — HEPARIN SODIUM (PORCINE) 1000 UNIT/ML IJ SOLN
INTRAMUSCULAR | Status: AC
Start: 2013-05-17 — End: 2013-05-17
  Filled 2013-05-17: qty 10

## 2013-05-17 MED ORDER — CLOPIDOGREL BISULFATE 75 MG PO TABS
75.0000 mg | ORAL_TABLET | Freq: Every day | ORAL | Status: DC
Start: 2013-05-17 — End: 2013-05-18
  Administered 2013-05-17 – 2013-05-18 (×2): 75 mg via ORAL
  Filled 2013-05-17 (×2): qty 1

## 2013-05-17 MED ORDER — SODIUM CHLORIDE 0.9 % IV SOLN
INTRAVENOUS | Status: DC
Start: 2013-05-17 — End: 2013-05-18

## 2013-05-17 NOTE — Plan of Care (Signed)
Problem: Inadequate Tissue Perfusion  Goal: Adequate tissue perfusion will be maintained  Outcome: Progressing  Pt. is alert and oriented x3, monitored vitals and encouraged to turn and re- positioned, will have L/heart cardiac cath today, Pt. is NPO after midnight;  slept well at night.

## 2013-05-17 NOTE — Consults (Signed)
Was asked to see patient by MD Zaheer. Pt appears weak, deconditioned and displays frailty of critical illness. MD Thedore Mins discussed w/patient and family results of cardiac catheterization and tje surgical option of open heart surgery. Pt adamantly refused any surgical procedure. Her wishes are for medical management and supportive care. Risks and benefits regarding surgical intervention discussed w/patient. Cardiac surgery to sign off.

## 2013-05-17 NOTE — Plan of Care (Signed)
Plan of care discussed w/Pt and family at bedside. Plan includes additional VS and assessments, continuation of IVF, evening medications, monitoring of right groin dressing, safety, comfort and hygiene. Family has questions regarding discharge tomorrow. Also that interruptions for VS is kept to a minimum. Advised unit protocol is VS q4h.    Severe Sepsis Screen  Date: 05/17/2013 Time: 8:02 PM  Nurse Signature: Johnathan Hausen Brandye Inthavong    A. Infection:    Does your patient have ONE or more of the following infection criteria?     []  Documented Infection - Does the patient have positive culture results (from blood,        sputum, urine, etc)?   []  Anti-Infective Therapy - Is the patient receiving antibiotic, antifungal, or other                anti-infective therapy?   []  Pneumonia - Is there documentation of pneumonia (X Ray, etc)?   []  WBC's - Have WBC's been found in normally sterile fluid (urine, CSF, etc.)?   []  Perforated Viscus -Does the patient have a perforated hollow organ (bowel)?    A.  Did you check any of the boxes above?    [x]  No  If No, Stop Here and Sepsis Screen Negative.               []  Yes, continue to section B      B. SIRS:     Does your patient have TWO or more of the following SIRS criteria (ensure vital signs & temperature are within 1 hour of this screening)?    []  Temperature - Is the patient's temperature: Temp: 98.3 F (36.8 C) (05/17/13 1952)   - Greater than or equal to 38.3 degrees C (greater than 100.9 degrees F)?   - Less than or equal to 36 degrees C (less than or equal to 96.8 degrees F)?    []  Heart Rate: Heart Rate: 72  (05/17/13 1952)   - Is the patient's heart rate greater than or equal to 90 bpm?    []  Respiratory: Resp Rate: 18  (05/17/13 1952)   - Is the patient's respiratory rate greater than or equal to 20?    []  WBC Count - Is the patient's WBC count:   Lab 05/17/13 0423   WBC 7.68      - Greater than or equal to 12,000/mm3 OR   - Less than or equal to 4,000/mm3 OR    - Are there  greater than 10% immature neutrophils (bands)?    []  Glucose >140 without diabetes?   Lab 05/17/13 0423   GLU 93       []  Significant edema?    B.  Did you check two or more of the boxes above?     []  No, Stop Here and Sepsis Screen Negative   []  Yes, continue to section C    C.  Acute Organ Dysfunction     Does your patient have ONE or more of the following organ dysfunction? (May need to wait for lab results for assessment - see below) Organ dysfunction must be a result of the sepsis not chronic conditions.    []  Cardiovascular - Does the patient have a: BP: 157/70 mmHg (05/17/13 1952)   -systolic Blood pressure less than or equal to 90 mmHg OR   -systolic blood pressure has dropped 40 mmHg or more from baseline OR   -mean arterial pressure less than or equal to 70 mmHg (for at least  one hour   despite fluid resuscitation OR require vasopressor support?  []  Respiratory - Does the patient have new hypoxia defined by any of the following"   -A sustained increase in oxygen requirements by at least 2L/min on NC or 28%    FiO2 within the last 24 hrs OR   -A persistent decrease in oxygen saturation of greater than or equal to 5% lasting   at least four or more hours and occurring within the last 24 hours  []  Renal - Does the patient have:   -low urine output (e.g. Less than 0.56mL/kg/HR for one hour despite adequate fluid    resuscitation, OR   -Increased creatinine (greater than 50% increase from baseline) OR   -require acute dialysis?  []  Hematologic - Does the patient have a:   -Low platelet count (less than 100,000 mm3)   Lab 05/17/13 0423   PLT 269   OR   -INR/aPTT greater than upper limit of normal?No results found for this basename: INR:1 in the last 168 hours or                No results found for this basename: APTT:1 in the last 168 hours  []  Metabolic - Does the patient have a high lactate (plasma lactate greater than or equal to 2.4 mMol/L)? No results found for this basename: LACTATE:5 in the last 168  hours    []  Hepatic - Are the patient's liver enzymes elevated (ALT greater than 72 IU/L or Total      Bilirubin greater than 2 MG/dL)?    Lab 05/13/13 2035   BILITOTAL 0.6   ALT 14     []  CNS - Does the patient have altered consciousness or reduced Glasgow Coma      Scale?    C.  Did you check any of the boxes above?     []  No, Sepsis Screen Negative   []  YES:  A) Infection + B) SIRS + C) Organ Dysfunction = Positive Screen for Severe Sepsis     Notify MD and document in Complex Assessment under provider notification   - Name of physician notified:                                           - Date/Time Notifiied:                                             Document actions: Following must be completed within 1 hour of positive sepsis screen   []  Lactate drawn (if initial lactate > , repeat lactate in 2 hours for goal decrease 10-20%)   []  Blood Cultures obtained (prior to antibiotic administration; if not done within the last 48 hours)   []  Antibiotics initiated or modified    []   IV Fluid administered 0.9% NS __________ mLs given (Initial Bolus of 30 ml/kg if SBP < 90 or MAP < 65 or lactate greater than 4 mmol/dl)  Nursing Comments?:     _________________________________________________________________    Patients meeting the following criteria are excluded from screening (check if applicable):   []  [place] hypothermia protocol   []  Comfort Care orders   []  Surgery- No screening for 24 hours after surgery (48 hours after CV surgery)       -  If Yes. Date of surgery:______________________   '[]'$  Admitted with sepsis and until 72 hours after admission with documented sepsis (RESUME SEPSIS SCREEN AFTER 72 hour window!!!)      -If Yes, Date of Documented Sepsis:______________________   '[]'$  Positive screen AND Completed sepsis bundle during previous 24 hours       -If Yes, Date/Time of positive severe sepsis screen:_______________________

## 2013-05-17 NOTE — Progress Notes (Signed)
PROGRESS NOTE    Date Time: 05/17/2013 7:55 PM  Patient Name: Jane [name],Jane [name]      Assessment:       1. Chest pain,; on exetion.   2. Hypertension. Blood pressure is under control.   3. CAD.   4. Fatigue.   5. Hyperlipidemia.    RECOMMENDATIONS:   1. Cath is done..   2. Add plavix.   3. Patient was also d/w Dr. Graciela Husbands..   4. Out of bed and ambulate.   5. Aspirin.   6. Nitrates.   7. Beta blockers.   8. Patient was d/w family in detail.   Patient does not want any surgical intervention.      Subjective/chief complaint:   Patient is seen and examinedPatient was d/w family in detail  Patient was d/w CT surgery service.  All medications and labs  reviewed    Medications:     Current Facility-Administered Medications   Medication Dose Route Frequency   . aspirin EC  81 mg Oral Daily   . calcium citrate-Vitamin D  1 tablet Oral Daily   . [COMPLETED] diphenhydrAMINE       . docusate sodium  100 mg Oral Daily   . [COMPLETED] fentaNYL       . fluticasone-salmeterol  1 puff Inhalation BID   . [COMPLETED] heparin (porcine)       . heparin (porcine)  5,000 Units Subcutaneous Q12H [facility]   . [COMPLETED] lidocaine       . magnesium hydroxide  30 mL Oral Daily   . metoprolol  25 mg Oral Q12H SCH   . nitroglycerin  1 patch Transdermal Q24H   . sertraline  100 mg Oral Daily   . simvastatin  20 mg Oral QHS   . vitamins/minerals  1 tablet Oral Daily       Review of Systems:   General ROS: no weight loss, no weight gain, no fever, no chills  Hematological and Lymphatic ROS: negative  Endocrine ROS: + fatigue, no polyuria, no polydipsia, no general weakness  Respiratory ROS: no shortness of breath, no cough, no wheezes and no hemoptysis  Cardiovascular ROS: +chest pain/ off and on, no palpitations, no PND and no orthopnea, +DOE  Gastrointestinal ROS:no nausea, no vomiting, no diarrhea, no constipation, no blood in stool and no abdominal pain  Musculoskeletal ROS: no muscle pain, + muscle weakness, no joint pain, no swelling and no  redness  Neurological ROS: no headache, no dizziness, no diplopia, no focal weakness, no seizure  Dermatological ROS: no rash, no itching and no ecchymoses, no pressure ulcer      Physical Exam:   BP 157/70  Pulse 72  Temp 98.3 F (36.8 C) (Oral)  Resp 18  Ht 1.448 Ko [place] (4\' 9" )  Wt 53.343 kg (117 lb 9.6 oz)  BMI 25.44 kg/m2  SpO2 96%      Intake/Output Summary (Last 24 hours) at 05/17/13 1955  Last data filed at 05/17/13 1900   Gross per 24 hour   Intake    900 ml   Output      0 ml   Net    900 ml       General appearance - alert, well appearing, and in no distress  Mental status - alert, oriented to person, place, and time  HEENT:normocephalic, EOMI, PERRL, no icterus, no pallor, no cyanosis  Neck: supple, no JVD, no thyromegaly, no carotid bruits  Chest - crackles to  to auscultation, no wheezes, rales or  rhonchi, symmetric air entry  Heart - normal rate, regular rhythm, normal S1, S2, no S3 ,+murmurs, rubs, clicks or gallops  Abdomen - soft, nontender, nondistended, no masses or organomegaly  Neurological - alert, oriented, normal speech, no focal findings  noted  Musculoskeletal - no joint tenderness, deformity or swelling seen.  Extremities - peripheral pulses normal, no pedal edema, no clubbing or cyanosis  Skin - normal color , no rashes.    Labs:     CBC w/Diff     Lab 05/17/13 0423 05/16/13 0433 05/13/13 2059   WBC 7.68 8.84 8.17   HGB 12.0 11.9* 13.6   HCT 34.2* 34.5* 38.3   PLT 269 254 345          Basic Metabolic Profile     Lab 05/17/13 0423 05/16/13 0433 05/13/13 2035   NA 134* 134* 130*   K 4.3 4.3 4.3   CL 104 104 96*   CO2 24 22 23    BUN 13.0 14.0 11.0   CREAT 0.8 0.8 0.8   CA 8.7 9.0 9.7   ALT -- -- 14   AST -- -- 22   GLU 93 115* 91          Cardiac Enzymes     Lab 05/14/13 0811 05/14/13 0205 05/13/13 2059 05/13/13 2035   CK 49 52 -- 56   TROPI 0.05 0.04 0.02 --   TROPT -- -- -- --   CKMBINDEX -- -- -- --       Lab Results   Component Value Date    BNP 61 05/13/2013          Thyroid Studies       Lab 05/16/13 0433   TSH 2.45   FREET3 2.20           Lipid Profile     Lab 05/16/13 0433   CHOL 120          Radiology Results (24 Hour)     ** No Results found for the last 24 hours. Sande Brothers, MD  05/17/2013 7:55 PM

## 2013-05-17 NOTE — Progress Notes (Signed)
PROGRESS NOTE                                                 Jane Quint MD, [place], CMD                                                             [id number]    Date Time: 05/17/2013 4:37 PM  Patient Name: Jane [name],Jane [name]  Patient status: Inpatient  Patient hospital day: 4          Assessment:   Chest pain with exertional dyspnea with positive NST partially reversible anterior, anteroseptal, septal and inferlat perfusion defect compat with local infarct/moderate surrounding ischemia, EF 73%  Generalized weakness  Cold intolerance, TFTs normal  Depression  Gait disorder  Hyponatremia, mild  Hx HTN  Mild anemia  Hx hyperlipidemia, lipid panel 120/42/59/96  Dementia   Plan:   Cardiology following, cardiac cath completed  Family not agreeable to cardiac bypass  No lisinopril for now, as per Dr.Zaheer  Cont Aspirin  Cont Beta blocker  Cont statin  Echocardiogram pending  PT/OT   Psych following   Continue zoloft at increased dosage  Am labs  DVT prophylaxis  D/C planning home with home health.   Subjective:   No cp/sob. Family at bedside. Discussed plan of care.    Medications:     Current Facility-Administered Medications   Medication Dose Route Frequency   . aspirin EC  81 mg Oral Daily   . calcium citrate-Vitamin D  1 tablet Oral Daily   . [COMPLETED] diphenhydrAMINE       . docusate sodium  100 mg Oral Daily   . [COMPLETED] fentaNYL       . fluticasone-salmeterol  1 puff Inhalation BID   . [COMPLETED] heparin (porcine)       . heparin (porcine)  5,000 Units Subcutaneous Q12H [facility]   . [COMPLETED] lidocaine       . magnesium hydroxide  30 mL Oral Daily   . metoprolol  25 mg Oral Q12H SCH   . nitroglycerin  1 patch Transdermal Q24H   . sertraline  100 mg Oral Daily   . simvastatin  20 mg Oral QHS   . vitamins/minerals  1 tablet Oral Daily       Review of Systems:    General ROS: no fever, depressed   ENT ROS: negative   Endocrine ROS:+ fatigue, generalized weakness   Respiratory ROS: no cough   Cardiovascular  ROS:+chest pain and dyspnea on exertion   Gastrointestinal ROS: no abdominal pain   Genito-Urinary ROS: no dysuria   Musculoskeletal ROS: negative   Neurological ROS: no TIA or stroke symptoms   Dermatological ROS: negative    Physical Exam:     Filed Vitals:    05/17/13 1400   BP: 142/66   Pulse:    Temp:    Resp:    SpO2:          Intake/Output Summary (Last 24 hours) at 05/17/13 1637  Last data filed at 05/17/13 0500   Gross per 24 hour   Intake      0 ml   Output  0 ml   Net      0 ml       General appearance - alert, ill appearing, and in no distress, pale  Mental status - alert, oriented to person, place, and time, primarily hindi speaking  Eyes - pupils equal and reactive  Nose - normal and patent  Mouth - mucous membranes moist  Neck - Supple, no JVD  Lymphatics - no  lymphadenopathy  Chest - clear to auscultation, no wheezes,  symmetric air entry  Heart - normal rate, regular rhythm, normal S1, S2, no murmurs  Abdomen - soft, nontender, nondistended,  bowel sounds normal  Neurological - alert, oriented, normal speech, no focal findings or movement disorder noted,   Extremities - peripheral pulses normal, no pedal edema  Skin - normal coloration and turgor, no suspicious skin lesions noted    Labs:     CBC w/Diff CMP     Lab 05/17/13 0423 05/16/13 0433 05/13/13 2059   WBC 7.68 8.84 8.17   HGB 12.0 11.9* 13.6   HCT 34.2* 34.5* 38.3   PLT 269 254 345   MCV 85.3 85.8 85.5   NEUTRO 65 69 63       PT/INR   No results found for this basename: INR:3 in the last 168 hours      Lab 05/17/13 0423 05/16/13 0433 05/13/13 2035   NA 134* 134* 130*   K 4.3 4.3 4.3   CL 104 104 96*   CO2 24 22 23    BUN 13.0 14.0 11.0   CREAT 0.8 0.8 0.8   GLU 93 115* 91   CA 8.7 9.0 9.7   MG -- 2.1 --   PHOS -- 3.8 --   PROT -- -- 6.6   ALB -- -- 3.8   AST -- -- 22   ALT -- -- 14   ALKPHOS -- -- 35*   BILITOTAL -- -- 0.6      Glucose POCT     Lab 05/17/13 0423 05/16/13 0433 05/13/13 2035   GLU 93 115* 91          Lab 05/14/13 0811  05/14/13 0205 05/13/13 2059 05/13/13 2035   CK 49 52 -- 56   TROPI 0.05 0.04 0.02 --   TROPT -- -- -- --   CKMBINDEX -- -- -- --         Urinalysis    Lab 05/14/13 0205   URINETYPE Clean Catch   COLORUA Yellow   CLARITYUA Clear   SPECGRAVUA 1.006   URINEPH 8.0   NITRITEUA Negative   PROTEINUA Negative   GLUUA --   KETONESUA Negative   UROBILIUA Negative   BILIUA Negative   BLOODUA Negative   RBCUA 0 - 5   WBCUA 0 - 5   URINEBACTERI --   GRANCASTUA --         Rads:     Radiology Results (24 Hour)     ** No Results found for the last 24 hours. **          Zina Potorac V, NP  05/17/2013  4:37 PM   Seen/examined by Dr. Graciela Husbands plan of care discussed. In agreement with above.Pt seen and examined by me. I have reviewed the notes, assessments, and/or procedures performed by Jesse Sans, I concur with her/his documentation of Jane [name].

## 2013-05-17 NOTE — Progress Notes (Signed)
ASA      Indication(s):   (x ) Failed stress test  ( ) Chest pain / Angina  ( ) Abnormal EKG  ( ) Known CAD  ( ) Heart Failure / post Heart Transplant  ( ) Other:    Review of systems performed:   Yes  (x)         Allergies:     No Known Allergies      Most recent labs:    If available in Epic:     Lab Results   Component Value Date    WBC 7.68 05/17/2013    HGB 12.0 05/17/2013    HCT 34.2* 05/17/2013    MCV 85.3 05/17/2013    PLT 269 05/17/2013          Lab 05/17/13 0423 05/16/13 0433 05/13/13 2035   NA 134* -- --   K 4.3 -- --   CL 104 -- --   CO2 24 -- --   BUN 13.0 -- --   CREAT 0.8 -- --   EGFR >60.0 -- --   GLU 93 -- --   CA 8.7 -- --   ALB -- -- 3.8   PHOS -- 3.8 --       If unavailable in Epic, present in paper chart.    ASA Physical Status:   (  )  ASA 1  Healthy patient    (x  )  ASA 2  Mild systemic illness    (  )  ASA 3  Systemic disease, though not incapacitating    (  )  ASA 4  Severe systemic disease that is a constant threat to life     (  )  ASA 5  Moribund condition, patient unexpected to live >24 hours, irrespective of    procedure    (  )  E       Emergent procedure    Planned sedation:   (  ) No sedation  (x) Moderate sedation  (  ) Deep sedation (with Anesthesiology present)      Conclusion:     This patient has been seen and examined immediately prior to the procedure, and I feel that they are an appropriate candidate for the planned procedure with the planned sedation.    The risks, benefits, and alternatives to the planned procedure and sedation have been explained to the patient or the patient's guardian.     The currently available history & physical has been reviewed, and there are no major changes.     Exceptions only in the case of emergency.         Sande Brothers, MD

## 2013-05-17 NOTE — Plan of Care (Signed)
Problem: Moderate/High Fall Risk Score >/=15  Goal: Patient will remain free of falls  Outcome: Progressing  Pt had cardiac cath today. Right femoral site covered with dressing. No bleeding or hematoma noted. Pt had bedrest for four hours. Pt is alert and oriented with language barrier. Family at bedside. Pt ambulates to the bathroom with assist. Pt denies any chest pain or discomfort.

## 2013-05-17 NOTE — Patient Care Conference (Signed)
Bedside report given, discussed with the pt and family about plan for cardiac cath. They verbalized understanding.  Severe Sepsis Screen  Date: 05/17/2013 Time: 8:52 AM  Nurse Signature: Concha Pyo Tonyetta Berko    A. Infection:    Does your patient have ONE or more of the following infection criteria?     []  Documented Infection - Does the patient have positive culture results (from blood,        sputum, urine, etc)?   []  Anti-Infective Therapy - Is the patient receiving antibiotic, antifungal, or other                anti-infective therapy?   []  Pneumonia - Is there documentation of pneumonia (X Ray, etc)?   []  WBC's - Have WBC's been found in normally sterile fluid (urine, CSF, etc.)?   []  Perforated Viscus -Does the patient have a perforated hollow organ (bowel)?    A.  Did you check any of the boxes above?    [x]  No  If No, Stop Here and Sepsis Screen Negative.               []  Yes, continue to section B      B. SIRS:     Does your patient have TWO or more of the following SIRS criteria (ensure vital signs & temperature are within 1 hour of this screening)?    []  Temperature - Is the patient's temperature: Temp: 97.4 F (36.3 C) (05/17/13 0746)   - Greater than or equal to 38.3 degrees C (greater than 100.9 degrees F)?   - Less than or equal to 36 degrees C (less than or equal to 96.8 degrees F)?    []  Heart Rate: Heart Rate: 72  (05/17/13 0746)   - Is the patient's heart rate greater than or equal to 90 bpm?    []  Respiratory: Resp Rate: 16  (05/17/13 0746)   - Is the patient's respiratory rate greater than or equal to 20?    []  WBC Count - Is the patient's WBC count:   Lab 05/17/13 0423   WBC 7.68      - Greater than or equal to 12,000/mm3 OR   - Less than or equal to 4,000/mm3 OR    - Are there greater than 10% immature neutrophils (bands)?    []  Glucose >140 without diabetes?   Lab 05/17/13 0423   GLU 93       []  Significant edema?    B.  Did you check two or more of the boxes above?     []  No, Stop Here and Sepsis  Screen Negative   []  Yes, continue to section C    C.  Acute Organ Dysfunction     Does your patient have ONE or more of the following organ dysfunction? (May need to wait for lab results for assessment - see below) Organ dysfunction must be a result of the sepsis not chronic conditions.    []  Cardiovascular - Does the patient have a: BP: 174/72 mmHg (05/17/13 0746)   -systolic Blood pressure less than or equal to 90 mmHg OR   -systolic blood pressure has dropped 40 mmHg or more from baseline OR   -mean arterial pressure less than or equal to 70 mmHg (for at least one hour   despite fluid resuscitation OR require vasopressor support?  []  Respiratory - Does the patient have new hypoxia defined by any of the following"   -A sustained increase in oxygen requirements by  at least 2L/min on NC or 28%    FiO2 within the last 24 hrs OR   -A persistent decrease in oxygen saturation of greater than or equal to 5% lasting   at least four or more hours and occurring within the last 24 hours  []  Renal - Does the patient have:   -low urine output (e.g. Less than 0.58mL/kg/HR for one hour despite adequate fluid    resuscitation, OR   -Increased creatinine (greater than 50% increase from baseline) OR   -require acute dialysis?  []  Hematologic - Does the patient have a:   -Low platelet count (less than 100,000 mm3)   Lab 05/17/13 0423   PLT 269   OR   -INR/aPTT greater than upper limit of normal?No results found for this basename: INR:1 in the last 168 hours or                No results found for this basename: APTT:1 in the last 168 hours  []  Metabolic - Does the patient have a high lactate (plasma lactate greater than or equal to 2.4 mMol/L)? No results found for this basename: LACTATE:5 in the last 168 hours    []  Hepatic - Are the patient's liver enzymes elevated (ALT greater than 72 IU/L or Total      Bilirubin greater than 2 MG/dL)?    Lab 05/13/13 2035   BILITOTAL 0.6   ALT 14     []  CNS - Does the patient have altered  consciousness or reduced Glasgow Coma      Scale?    C.  Did you check any of the boxes above?     []  No, Sepsis Screen Negative   []  YES:  A) Infection + B) SIRS + C) Organ Dysfunction = Positive Screen for Severe Sepsis     Notify MD and document in Complex Assessment under provider notification   - Name of physician notified:                                           - Date/Time Notifiied:                                             Document actions: Following must be completed within 1 hour of positive sepsis screen   []  Lactate drawn (if initial lactate > , repeat lactate in 2 hours for goal decrease 10-20%)   []  Blood Cultures obtained (prior to antibiotic administration; if not done within the last 48 hours)   []  Antibiotics initiated or modified    []   IV Fluid administered 0.9% NS __________ mLs given (Initial Bolus of 30 ml/kg if SBP < 90 or MAP < 65 or lactate greater than 4 mmol/dl)  Nursing Comments?:     _________________________________________________________________    Patients meeting the following criteria are excluded from screening (check if applicable):   []  [place] hypothermia protocol   []  Comfort Care orders   []  Surgery- No screening for 24 hours after surgery (48 hours after CV surgery)       -If Yes. Date of surgery:______________________   []  Admitted with sepsis and until 72 hours after admission with documented sepsis (RESUME SEPSIS SCREEN AFTER 72 hour window!!!)      -  If Yes, Date of Documented Sepsis:______________________   _0  Positive screen AND Completed sepsis bundle during previous 24 hours       -If Yes, Date/Time of positive severe sepsis screen:_______________________

## 2013-05-18 MED ORDER — ASPIRIN EC 81 MG PO TBEC
81.0000 mg | DELAYED_RELEASE_TABLET | Freq: Every day | ORAL | Status: AC
Start: 2013-05-18 — End: ?

## 2013-05-18 MED ORDER — CLOPIDOGREL BISULFATE 75 MG PO TABS
75.00 mg | ORAL_TABLET | Freq: Every day | ORAL | Status: AC
Start: 2013-05-18 — End: ?

## 2013-05-18 MED ORDER — CARVEDILOL 12.5 MG PO TABS
12.50 mg | ORAL_TABLET | Freq: Two times a day (BID) | ORAL | Status: AC
Start: 2013-05-18 — End: ?

## 2013-05-18 MED ORDER — LISINOPRIL 5 MG PO TABS
2.5000 mg | ORAL_TABLET | Freq: Every day | ORAL | Status: DC
Start: 2013-05-18 — End: 2013-06-10

## 2013-05-18 MED ORDER — NITROGLYCERIN 0.2 MG/HR TD PT24
1.0000 | MEDICATED_PATCH | Freq: Every day | TRANSDERMAL | Status: AC
Start: 2013-05-18 — End: ?

## 2013-05-18 NOTE — Plan of Care (Signed)
Pt's family stayed in room and assisted w/care during the night. Pt ambulated well for the bathroom. Family assisted in bathing her and clean gown. Dressing at incision site remains clean, dry and intact. IVF paused due to elevated BP, currently 161/75. Denies SOB, CP, N/V, head ache, palpitations or dizziness. Does experience DOE and slight light headedness when ambulating. Pt and family anxious for Pt to be discharged today.

## 2013-05-18 NOTE — Progress Notes (Signed)
Home Health Referral          Referral from Lowella Petties, SW (Case Manager) for home health care upon discharge.    By [employer], the patient has the right to freely choose a home care provider.  Arrangements have been made with:     A company of the patients choosing. We have supplied the patient with a listing of providers in your area who asked to be included and participate in Medicare.   [facility], a home care agency that provides both adult home care services which is a wholly owned and operated by [employer] and participates in [employer]   The preferred provider of your insurance company. Choosing a home care provider other than your insurance company's preferred provider may affect your insurance coverage.    The Home Health Care Referral Form acknowledging the voluntary selection of the home care company has been completed, signed, and is on file.      Home Health Discharge Information     Your doctor has ordered Skilled Nursing, Physical Therapy, Occupational Therapy and Home Health Aide in-home service(s) for you while you recuperate at home, to assist you in the transition from hospital to home.      The agency that you or your representative chose to provide the service:  Name: [facility] Phone #:   [phone]                [employer]:  Name:  Patsy Lager  Phone #:  [phone]    Equipment Ordered:   Hospital bed, junior rolling walker, wheelchair, bedside commode    Gave daughter phone number to [facility] [phone] for tub transfer bench.      The above services were set up by:              Kathyrn Sheriff, RN           [facility] Liaison)   Phone        [phone]                                     Additional comments: agency will call to arrange visit schedule      Signed by: Emiliano Dyer  Date Time: 05/18/2013 4:43 PM

## 2013-05-18 NOTE — Patient Care Conference (Signed)
Ishaped report received at the bedside. Discussed with pt and family the plan to increase activity as tolerated, monitor BP and possible discharge. They verbalized understanding.  Severe Sepsis Screen  Date: 05/18/2013 Time: 7:49 AM  Nurse Signature: Concha Pyo Blanton Kardell    A. Infection:    Does your patient have ONE or more of the following infection criteria?     []  Documented Infection - Does the patient have positive culture results (from blood,        sputum, urine, etc)?   []  Anti-Infective Therapy - Is the patient receiving antibiotic, antifungal, or other                anti-infective therapy?   []  Pneumonia - Is there documentation of pneumonia (X Ray, etc)?   []  WBC's - Have WBC's been found in normally sterile fluid (urine, CSF, etc.)?   []  Perforated Viscus -Does the patient have a perforated hollow organ (bowel)?    A.  Did you check any of the boxes above?    [x]  No  If No, Stop Here and Sepsis Screen Negative.               []  Yes, continue to section B      B. SIRS:     Does your patient have TWO or more of the following SIRS criteria (ensure vital signs & temperature are within 1 hour of this screening)?    []  Temperature - Is the patient's temperature: Temp: 97.6 F (36.4 C) (05/18/13 0743)   - Greater than or equal to 38.3 degrees C (greater than 100.9 degrees F)?   - Less than or equal to 36 degrees C (less than or equal to 96.8 degrees F)?    []  Heart Rate: Heart Rate: 72  (05/18/13 0743)   - Is the patient's heart rate greater than or equal to 90 bpm?    []  Respiratory: Resp Rate: 17  (05/18/13 0743)   - Is the patient's respiratory rate greater than or equal to 20?    []  WBC Count - Is the patient's WBC count:   Lab 05/17/13 0423   WBC 7.68      - Greater than or equal to 12,000/mm3 OR   - Less than or equal to 4,000/mm3 OR    - Are there greater than 10% immature neutrophils (bands)?    []  Glucose >140 without diabetes?   Lab 05/17/13 0423   GLU 93       []  Significant edema?    B.  Did you check two  or more of the boxes above?     []  No, Stop Here and Sepsis Screen Negative   []  Yes, continue to section C    C.  Acute Organ Dysfunction     Does your patient have ONE or more of the following organ dysfunction? (May need to wait for lab results for assessment - see below) Organ dysfunction must be a result of the sepsis not chronic conditions.    []  Cardiovascular - Does the patient have a: BP: 148/66 mmHg (05/18/13 0743)   -systolic Blood pressure less than or equal to 90 mmHg OR   -systolic blood pressure has dropped 40 mmHg or more from baseline OR   -mean arterial pressure less than or equal to 70 mmHg (for at least one hour   despite fluid resuscitation OR require vasopressor support?  []  Respiratory - Does the patient have new hypoxia defined by any of the following"   -  A sustained increase in oxygen requirements by at least 2L/min on NC or 28%    FiO2 within the last 24 hrs OR   -A persistent decrease in oxygen saturation of greater than or equal to 5% lasting   at least four or more hours and occurring within the last 24 hours  []  Renal - Does the patient have:   -low urine output (e.g. Less than 0.74mL/kg/HR for one hour despite adequate fluid    resuscitation, OR   -Increased creatinine (greater than 50% increase from baseline) OR   -require acute dialysis?  []  Hematologic - Does the patient have a:   -Low platelet count (less than 100,000 mm3)   Lab 05/17/13 0423   PLT 269   OR   -INR/aPTT greater than upper limit of normal?No results found for this basename: INR:1 in the last 168 hours or                No results found for this basename: APTT:1 in the last 168 hours  []  Metabolic - Does the patient have a high lactate (plasma lactate greater than or equal to 2.4 mMol/L)? No results found for this basename: LACTATE:5 in the last 168 hours    []  Hepatic - Are the patient's liver enzymes elevated (ALT greater than 72 IU/L or Total      Bilirubin greater than 2 MG/dL)?    Lab 05/13/13 2035   BILITOTAL 0.6    ALT 14     []  CNS - Does the patient have altered consciousness or reduced Glasgow Coma      Scale?    C.  Did you check any of the boxes above?     []  No, Sepsis Screen Negative   []  YES:  A) Infection + B) SIRS + C) Organ Dysfunction = Positive Screen for Severe Sepsis     Notify MD and document in Complex Assessment under provider notification   - Name of physician notified:                                           - Date/Time Notifiied:                                             Document actions: Following must be completed within 1 hour of positive sepsis screen   []  Lactate drawn (if initial lactate > , repeat lactate in 2 hours for goal decrease 10-20%)   []  Blood Cultures obtained (prior to antibiotic administration; if not done within the last 48 hours)   []  Antibiotics initiated or modified    []   IV Fluid administered 0.9% NS __________ mLs given (Initial Bolus of 30 ml/kg if SBP < 90 or MAP < 65 or lactate greater than 4 mmol/dl)  Nursing Comments?:     _________________________________________________________________    Patients meeting the following criteria are excluded from screening (check if applicable):   []  [place] hypothermia protocol   []  Comfort Care orders   []  Surgery- No screening for 24 hours after surgery (48 hours after CV surgery)       -If Yes. Date of surgery:______________________   []  Admitted with sepsis and until 72 hours after admission with documented sepsis (RESUME SEPSIS SCREEN AFTER 72  hour window!!!)      -If Yes, Date of Documented Sepsis:______________________   _0  Positive screen AND Completed sepsis bundle during previous 24 hours       -If Yes, Date/Time of positive severe sepsis screen:_______________________

## 2013-05-18 NOTE — Final Progress Note (DC Note for stay less than 48 (Signed)
Pt discharged home with home health service. VSS. D/C instructions and scripts given to pt and family, they verbalized understanding. IV line and telemetry discontinued. Pt was escorted in a wheelchair by staff and accompanied by family

## 2013-05-18 NOTE — Discharge Summary (Signed)
DISCHARGE NOTE    Date Time: 05/18/2013 12:41 PM  Patient Name: Jane [name]  Attending Physician: Gean Quint, MD    Date of Admission:   05/13/2013    Date of Discharge:   05/18/2013    Reason for Admission:   Weakness [780.79]  Abnormal EKG [794.31]  Chest pain [786.50]  142335 [id number]  141811 Weakness [id number]    Discharge Diagnosis     Chest pain 2ary to multi vessel coronary artery disease   Asthma hx, stable   Generalized weakness   Cold intolerance, TFTs normal   Depression   Gait disorder   Hyponatremia, mild   Hx HTN   Mild anemia   Hx hyperlipidemia, lipid panel 120/42/59/96   Dementia       Discharge Medications:     Current Discharge Medication List      START taking these medications    Details   aspirin EC 81 MG EC tablet Take 1 tablet (81 mg total) by mouth daily.  Qty: 30 tablet, Refills: 0      carvedilol (COREG) 12.5 MG tablet Take 1 tablet (12.5 mg total) by mouth 2 (two) times daily with meals.  Qty: 60 tablet, Refills: 0      clopidogrel (PLAVIX) 75 mg tablet Take 1 tablet (75 mg total) by mouth daily.  Qty: 30 tablet, Refills: 0      lisinopril (PRINIVIL,ZESTRIL) 5 MG tablet Take 0.5 tablets (2.5 mg total) by mouth daily.  Qty: 30 tablet, Refills: 0      nitroglycerin (NITRODUR) 0.2 MG/HR Place 1 patch onto the skin daily.  Qty: 30 patch, Refills: 0         CONTINUE these medications which have NOT CHANGED    Details   calcium-vitamin D (OSCAL-500) 500-200 MG-UNIT per tablet Take 1 tablet by mouth daily.      fluticasone-salmeterol (ADVAIR DISKUS) 100-50 MCG/DOSE AEPB Inhale 1 puff into the lungs.      Multiple Vitamins-Minerals (CENTRUM SILVER PO) Take by mouth.      sertraline (ZOLOFT) 50 MG tablet Take 50 mg by mouth daily.      simvastatin (ZOCOR) 20 MG tablet Take 20 mg by mouth nightly.         STOP taking these medications       aspirin 81 MG tablet        metoprolol XL (TOPROL-XL) 25 MG 24 hr tablet              Consultations:   Cardiology, Dr.Zaheer  Psychiatry,  Dr.Prasad         Physical Exam:     Filed Vitals:    05/18/13 1119   BP: 138/70   Pulse: 72   Temp: 98.1 F (36.7 C)   Resp: 18   SpO2: 96%         General: Not in acute distress  HEENT: NC/AT, pupil reacting to light  Neck: supple  Chest: Clear to auscultation  CV: normal S1 and S2  Abdomen: soft, BS+ve, non distended,nontender  Extremities: no edema  Neuro: AAOx3, moving all extremities    Labs:     Lab 05/17/13 0423 05/16/13 0433 05/13/13 2035   GLU 93 115* 91   BUN 13.0 14.0 11.0   CREAT 0.8 0.8 0.8   CA 8.7 9.0 9.7   NA 134* 134* 130*   K 4.3 4.3 4.3   CL 104 104 96*   CO2 24 22 23    ALB -- -- 3.8   PHOS --  3.8 --   MG -- 2.1 --   AST -- -- 22   ALT -- -- 14   BILITOTAL -- -- 0.6   ALKPHOS -- -- 35*       Lab 05/17/13 0423 05/16/13 0433 05/13/13 2059   WBC 7.68 8.84 8.17   HGB 12.0 11.9* 13.6   HCT 34.2* 34.5* 38.3   MCV 85.3 85.8 85.5   MCH 29.9 29.6 30.4   MCHC 35.1 34.5 35.5   PLT 269 254 345     No results found for this basename: INR:3 in the last 168 hours      Urinalysis    Lab 05/14/13 0205   URINETYPE Clean Catch   COLORUA Yellow   CLARITYUA Clear   SPECGRAVUA 1.006   URINEPH 8.0   NITRITEUA Negative   PROTEINUA Negative   GLUUA --   KETONESUA Negative   UROBILIUA Negative   BILIUA Negative   BLOODUA Negative   RBCUA 0 - 5   WBCUA 0 - 5   URINEBACTERI --   GRANCASTUA --       RADIOLOGY :  Chest 2 Views    05/13/2013  HISTORY: chest pain  TECHNIQUE: PA and lateral views of the chest were obtained.   COMPARISON: 04/12/2012.  FINDINGS:    The cardiomediastinal contours are within normal limits.  No pneumothorax is seen. There are stable prominent interstitial markings at the hila and lung bases compatible with prominent vascular markings and scarring. No superimposed focal consolidations are demonstrated. A 3 mm calcified granuloma is noted within the right midlung.       05/13/2013   No evidence of acute cardiopulmonary abnormality.  Gustavus Messing, MD  05/13/2013 10:26 PM     Nm Myocardial Perfusion Spect  (stress And Rest)    05/15/2013  LEXISCAN SPECT MYOVIEW SAME DAY PROTOCOL  LEXISCAN MYOVIEW PERFUSION STUDY:  INDICATION: Chest pain  PROCEDURE: The patient was injected with 8 mCi of Myoview and SPECT imaging was begun  at least 30 minutes post injection. The camera obtained images with a 180-degree orbit around the patient's heart. After the rest images, the patient was then prepped for a regadenoson pharmacologic stress test. The patient was given 5 ml (0.4mg ) of IV regadenoson (Lexiscan) over a ten second rapid infusion with a five to ten ml normal saline flush given immediately after the infusion. The patient was then given an intravenous injection of 26.8 mCi of Myoview followed by another five to ten ml saline flush. The patient was monitored for approximately 5 minutes or until the patients EKG, BP, and symptoms came back to baseline. . Gated SPECT imaging was started at least 30 minutes post injection. The patient was then placed in the supine position and the images were obtained with a 180-degree orbit around the patient's heart while gating, when feasible, to the patient's ECG. The study was then processed and displayed for evaluation.  .  INTERPRETATION: Images were reviewed in the horizontal long axis, vertical long axis, and short axis. Review of [facility] quantification data and polar plots was also performed during this review.  The overall quality of the study is good. The left ventricular cavity is noted to be normal on the rest and/or stress images. SPECT images demonstrate a fairly extensive partially reversible anterior, anteroseptal, septal and inferoseptal perfusion defect of moderate severity. The rest images reveal no additional abnormality. Gated SPECT imaging reveals mild anteroseptal hypokinesis. The calculated left ventricular ejection fraction is 73%.  05/15/2013   Partially reversible anterior, anteroseptal, septal and inferoseptal perfusion defect compatible with a focal infarct  with moderate surrounding ischemia  Laurena Slimmer, MD  05/15/2013 2:48 PM     Cv Cardiac Stress Test Tracing Only    05/15/2013  DESCRIPTION OF PROCEDURE: The patient underwent Lexiscan stress test.  She received Lexiscan 0.4 mg IV, flushed by normal saline.  The patient did not experience chest pain.  There were no significant EKG changes from the baseline.  IMPRESSION: Negative Lexiscan stress.     Left Heart Cath Poss Pci    05/17/2013  PREOPERATIVE DIAGNOSIS:  POSTOPERATIVE DIAGNOSIS:  TITLE OF PROCEDURES: Right and left heart catheterization, left ventriculography, and coronary cineangiography.  INDICATIONS FOR PROCEDURE: The patient is an 79 year old lady with chest pain, shortness of breath on minimal exertion, fatigue, and abnormal nuclear stress test.  DESCRIPTION OF PROCEDURE: The patient was prepped and draped appropriately with right groin exposed.  Under local anesthesia, a 4-French sheath was placed in the right femoral artery.  Hemodynamic and electrocardiographic monitoring were continuous.  The patient tolerated the procedure with no known complications.  Right heart catheterization was performed using Swan-Ganz catheter.  Left heart catheterization was performed using left and right Judkins catheter.  Left ventriculography was performed using pigtail catheter.  HEMODYNAMICS: Pulmonary capillary wedge pressure is 10.  PA pressure is 28/10.  RA pressure is 30/5.  RA pressure is 5.  LV pressure is 160/14.  Aorta is 160/79.  LEFT VENTRICULOGRAPHY: Shows normal LV systolic function with ejection fraction 60% to 65%.  CORONARIES: Left coronary artery:  Left main shows calcification.  There is a 40% to distal left main.  There is also 30% to 40% stenosis at the ostium.  LAD is heavily calcified and is 70% occluded.  Proximally, the LAD fills retrograde by the left circumflex.  Left circumflex shows luminal irregularities, it is a tortuous vessel.  There is no significant lesion in the left circumflex.  The right  coronary artery is a large, dominant vessel.  It shows 50% to 70% stenosis in the midsegment and in the distal segment.  IMPRESSION: 1.  Left main disease. 2.  Significant 2-vessel coronary artery disease. 3.  Left ventricular diastolic dysfunction.           Procedures performed:   No orders of the defined types were placed in this encounter.         Hospital Course:   See daily progress notes. Pt is a 79 year old female who mostly speaks Hindi came into the ER with chest pressure, sob, no n/v/diaphoresis.  Pt also states having this pain for past 2 weeks. Cardiology was consulted. Psychiatry consulted for the fatigue and depression.  Pt had a cardiac cath and found multiple vessel disease. Pt and family refused coronary bipas and medical treatment was agreed upon.  ECHO showed normal EF.  Pt d/ced home with multiple meds and f/u with cardiology in one week.          Discharge Instructions:       Follow-up with Darnelle Catalan, MD in 1 week    Zaheer, Margrett Rud, MD  [address] [place]  [address]  [place] [address]  [phone]    In 1 week    Darnelle Catalan, MD  [address]  [place] [address]  [phone]    In 1 week      Signed by: Cindy Hazy NP  Pt seen by  Dr.Sophiea Ueda. Plan of care discussed. In agreement with above.     Pt seen and examined by me. I have reviewed the notes, assessments, and/or procedures performed by Jesse Sans, I concur with her/his documentation of Mikisha P Speelman.

## 2013-05-18 NOTE — Discharge Instructions (Signed)
MEDICATION: CLOPIDOGREL  Clopidogrel (brand name: Plavix) is used to prevent clots and reduce the risk of stroke and heart attack.  DIRECTIONS FOR USE:  Take this medicine with a full glass of water with or without food, at the same time each day. Follow your doctor's advice exactly regarding how to take this medicine. Do not stop the medicine without talking to your doctor.  If you miss a dose, take it when you remember. However, if it is almost time for your next dose, skip the missed dose. Do not take 2 doses at once to make up for the missed one.  WHAT TO WATCH FOR:  POSSIBLE SIDE EFFECTS: Signs of excess bleeding: increased bruising, prolonged bleeding from cuts, bleeding from nose or gums, blood in urine, vomit or stools (red or black color), coughing up blood or unusually heavy menstrual bleeding (Call your doctor promptly or return to this facility). Nausea, diarrhea, constipation, abdominal pain, signs of infection, unusual weakness/tiredness (Contact your doctor if these symptoms persist or become severe). Severe headache, confusion drowsiness or dizziness (Contact your doctor or return to this facility at once).  ALLERGIC REACTIONS: Rash, itching, swelling, trouble swallowing or breathing (Contact your doctor or return to this facility promptly).  MEDICAL CONDITIONS: Before starting this medicine, be sure your doctor knows if you have any of the following conditions:   Stomach ulcer (now, or in the past), ever vomited blood or had bloody stools (red or black color)   Active bleeding   Recent surgery or injury   Kidney or liver disease, diabetes, blood disorder   Pregnancy, breastfeeding   Recent surgery or dental procedure  DRUG INTERACTIONS: Before starting this medicine, be sure your doctor knows if you are taking any of the following drugs:   Anticoagulants or "blood thinners" such as Coumadin (warfarin), Lovenox (enoxaparin), Ticlid (ticlopidine), or aspirin   Anti-inflammatory drugs such  as ibuprofen (Advil, Motrin, or Nuprin), naproxen (Aleve, Naprosyn, Anaprox), ketoprofen (Orudis, Oruvail) and other arthritis medicines (salicylates)   Lescol (fluvastatin), Dilantin (phenytoin), Nolvadex (tamoxifen), Orinase (tolbutamide), Demadex (torsemide)   Drotrecogin, herbal products, ibritumomab, clarithromycin/erythromycin   Proton pump inhibitors, fluoxetine/fluvoxamine, fluconazole/ketoconazole, etravirine   Any vitamins, herbal products or other over-the-counter drugs  WARNINGS:   Before having surgery or a dental procedure, notify your doctor that you are taking this drug.   Consult your doctor if you become pregnant while taking this drug.   Taking this medicine with alcohol may increase the risk of bleeding.  [NOTE: This information topic may not include all directions, precautions, medical conditions, drug/food interactions, and warnings for this drug. Check with your doctor, nurse, or pharmacist for any questions that you may have.]   [address], [place], [place] [address]. All rights reserved. This information is not intended as a substitute for professional medical care. Always follow your healthcare professional's instructions.  High Blood Pressure --Established    High Blood Pressure (Hypertension) is a chronic disease. The cause is unknown in most cases. It can usually be controlled with lifestyle changes and/or medicines. Symptoms of high blood pressure may include headache, dizziness, visual changes, chest pain and shortness of breath. Sometimes it causes no symptoms at all. However, even if there are no symptoms, untreated high blood pressure increases the risk of heart attack, also known as acute myocardial infarction, or AMI, and stroke. It is a serious health risk and should not be ignored.  A normal blood pressure is 120/80 or less. The first (top) number  is the "systolic" pressure. The second (bottom) number is the "diastolic" pressure.  Hypertension exists when either the top number is 140 or higher, OR the bottom number is 90 or higher on repeated measurements.  Home Care:  All patients with high blood pressure should do the following to lower their pressure. If you are on medicines, then these methods may reduce or eliminate your need for medicines in the future.  1. Begin a weight loss program if you are overweight.  2. Reduce your salt intake.   Avoid high salt foods (olives, pickles, smoked meats, salted potato chips, etc.).   Do not add salt to your food at the table.   Use only small amounts of salt when cooking.  3. Begin an exercise program. Discuss with your doctor what type of exercise program would be best for you. It doesn't have to be difficult. Even brisk walking for 20 minutes three times a week is a good form of exercise.  4. Avoid medicines which contain heart stimulants. This includes many cold and sinus decongestant pills and sprays as well as diet pills. Check the warnings about hypertension on the label. Stimulants such as amphetamine or cocaine could be lethal for someone with hypertension. Never take these.  5. Limit your caffeine intake or switch to caffeine-free products.  6. Stop smoking. If you are a long-time smoker, this can be hard. Enroll in a stop-smoking program to improve your chance of success.  7. Learning how to handle stress better is an important part of any program to lower blood pressure. Learn about relaxation methods such as meditation, yoga or biofeedback.  8. If medicines were prescribed, take them exactly as directed. Missing doses may cause your blood pressure get out of control.  9. Consider buying an automatic blood pressure machine (available at most pharmacies). Use this to monitor your blood pressure at home and report the results to your doctor.  Follow Up:  Regular visits to your own physician for blood pressure checks and medicine adjustment is an important part of your care. Make a  follow-up appointment as directed by our staff.  Get Prompt Medical Attention  if any of the following occur:   Chest pain or shortness of breath   Severe headache   Throbbing or rushing sound in the ears   Nosebleed   Sudden severe abdominal pain   Extreme drowsiness, confusion or fainting   Dizziness or vertigo (dizziness with spinning sensation)   Weakness of an arm or leg or one side of the face   Difficulty with speech or vision   [address], [address] [place], [place] [address]. All rights reserved. This information is not intended as a substitute for professional medical care. Always follow your healthcare professional's instructions.      Patient Education   Aspirin Chewable tablet    Aspirin Chewing-gum, medicated    Aspirin Gastro-resistant tablet    Aspirin Oral tablet    Aspirin Oral tablet, extended-release    Aspirin Rectal suppository   Aspirin Oral tablet  What is this medicine?  ASPIRIN (AS pir in) is a pain reliever. It is used to treat mild pain and fever. This medicine is also used as directed by a doctor to prevent and to treat heart attacks, to prevent strokes, and to treat arthritis or inflammation.  This medicine may be used for other purposes; ask your health care provider or pharmacist if you have questions.  What should I tell my health  care provider before I take this medicine?  They need to know if you have any of these conditions:   anemia   asthma   bleeding problems   child with chickenpox, the flu, or other viral infection   diabetes   gout   if you frequently drink alcohol containing drinks   kidney disease   liver disease   low level of vitamin K   lupus   smoke tobacco   stomach ulcers or other problems   an unusual or allergic reaction to aspirin, tartrazine dye, other medicines, dyes, or preservatives   pregnant or trying to get pregnant   breast-feeding  How should I use this medicine?  Take this medicine by mouth with a glass  of water. Follow the directions on the package or prescription label. You can take this medicine with or without food. If it upsets your stomach, take it with food. Do not take your medicine more often than directed.  Talk to your pediatrician regarding the use of this medicine in children. While this drug may be prescribed for children as young as 56 years of age for selected conditions, precautions do apply. Children and teenagers should not use this medicine to treat chicken pox or flu symptoms unless directed by a doctor.  Patients over 23 years old may have a stronger reaction and need a smaller dose.  Overdosage: If you think you have taken too much of this medicine contact a poison control center or emergency room at once.  NOTE: This medicine is only for you. Do not share this medicine with others.  What if I miss a dose?  If you are taking this medicine on a regular schedule and miss a dose, take it as soon as you can. If it is almost time for your next dose, take only that dose. Do not take double or extra doses.  What may interact with this medicine?  Do not take this medicine with any of the following medications:   cidofovir   ketorolac   probenecid  This medicine may also interact with the following medications:   alcohol   alendronate   bismuth subsalicylate   flavocoxid   herbal supplements like feverfew, garlic, ginger, ginkgo biloba, horse chestnut   medicines for diabetes or glaucoma like acetazolamide, methazolamide   medicines for gout   medicines that treat or prevent blood clots like enoxaparin, heparin, ticlopidine, warfarin   other aspirin and aspirin-like medicines   NSAIDs, medicines for pain and inflammation, like ibuprofen or naproxen   pemetrexed   sulfinpyrazone   varicella live vaccine  This list may not describe all possible interactions. Give your health care provider a list of all the medicines, herbs, non-prescription drugs, or dietary supplements you use. Also  tell them if you smoke, drink alcohol, or use illegal drugs. Some items may interact with your medicine.  What should I watch for while using this medicine?  If you are treating yourself for pain, tell your doctor or health care professional if the pain lasts more than 10 days, if it gets worse, or if there is a new or different kind of pain. Tell your doctor if you see redness or swelling. Also, check with your doctor if you have a fever that lasts for more than 3 days. Only take this medicine to prevent heart attacks or blood clotting if prescribed by your doctor or health care professional.  Do not take aspirin or aspirin-like medicines with this medicine. Too much  aspirin can be dangerous. Always read the labels carefully.  This medicine can irritate your stomach or cause bleeding problems. Do not smoke cigarettes or drink alcohol while taking this medicine. Do not lie down for 30 minutes after taking this medicine to prevent irritation to your throat.  If you are scheduled for any medical or dental procedure, tell your healthcare provider that you are taking this medicine. You may need to stop taking this medicine before the procedure.  What side effects may I notice from receiving this medicine?  Side effects that you should report to your doctor or health care professional as soon as possible:   allergic reactions like skin rash, itching or hives, swelling of the face, lips, or tongue   black, tarry stools   bloody, coffee ground-like vomit   breathing problems   changes in hearing, ringing in the ears   confusion   general ill feeling or flu-like symptoms   pain on swallowing   redness, blistering, peeling or loosening of the skin, including inside the mouth or nose   trouble passing urine or change in the amount of urine   unusual bleeding or bruising   unusually weak or tired   yellowing of the eyes or skin  Side effects that usually do not require medical attention (report to your doctor or  health care professional if they continue or are bothersome):   diarrhea or constipation   nausea, vomiting   stomach gas, heartburn  This list may not describe all possible side effects. Call your doctor for medical advice about side effects. You may report side effects to FDA at 1-800-FDA-1088.  Where should I keep my medicine?  Keep out of the reach of children.  Store at room temperature between 15 and 30 degrees C (59 and 86 degrees F). Protect from heat and moisture. Do not use this medicine if it has a strong vinegar smell. Throw away any unused medicine after the expiration date.  NOTE:This sheet is a summary. It may not cover all possible information. If you have questions about this medicine, talk to your doctor, pharmacist, or health care provider. Copyright 2014 [employer]  Date Time: 05/18/2013 1:22 PM  Attending Physician: Gean Quint, MD    Date of Admission:   05/13/2013    Reason for Admission:   Weakness [780.79]  Abnormal EKG [794.31]  Chest pain [786.50]  142335 [id number]  141811 Weakness [id number]    Follow up:        Neurology (If Stroke): ______________________  Phone:______________________    Other:___________________________________   Phone: ______________________       Medications:    Your medications have been listed for you on the Medication Reconciliation Discharge Home List. Please bring a copy of all discharge instructions, including your Medication Reconciliation Discharge Home List when you visit your physician.      Continue taking all medications even if you feel well, unless otherwise instructed by physician.    Do not take any over-the-counter medications or herbal supplements without checking with your pharmacist or doctor.       If you are taking Warfarin, please follow up with (health professional/clinic) ____________ on _____________to have your PT/INR blood test checked. ****    Activity:    Rise slowly from a sitting or lying position. Increase  activity slowly, unless otherwise instructed by physician.    Perform exercises as desginated by Therapist and Physician.    In the event of severe shortness of breath or chest discomfort, call  [phone]. Do NOT drive to the hospital.    Speak with your physician regarding specific driving and/or work restrictions.    Diet:    If you have special diet orders, you have been given printed diet instructions.   ________________________________________________________________________    Tobacco Cessation Counseling:  If you are currently a tobacco user or have used tobacco within the last 12 months, we have provided you with written Tobacco Cessation Counseling.  ________________________________________________________________________    Heart Failure Education:  If you have a diagnosis of a Heart Failure, we have provided you with written Heart Failure Education.     Weigh yourself once a day at the same time. Record and bring the weight record to your next physician appointment.     Call your doctor if you gain more than 3 pounds in one day or 5 pounds in one week, or if you experience shortness of breath, leg swelling and/or chest discomfort.     Enroll in the [facility] Tel-Assurance Program, a heart failure patient support program. Call [phone] for additional details.   ________________________________________________________________________    [facility] Education:    Call 911 for:    Sudden numbness or weakness of the face    Sudden numbness of the arm or leg especially one side of the body    Sudden confusion, trouble speaking or understanding    Sudden trouble seeing in one or both eyes    Sudden trouble walking or dizziness, loss of balance or coordination        For promotion of your health, we have provided you with personalized written education on risk factors specific to your diagnosis, including but not limited to:    High Blood Pressure    High Cholesterol    Atrial Fibrillation    Overweight    Diabetes     Smoking         Vaccinations  Pneumonia Vaccine Received on:  Flu Vaccine Received on:             Treatments/Special Instructions:   ***               Signed by: Rexene Alberts, RN    I HAVE RECEIVED AND UNDERSTAND THESE DISCHARGE INSTRUCTIONS.               Patient Education   Metoprolol Succinate Oral tablet, extended-release    Metoprolol Tartrate Oral tablet    Metoprolol Tartrate Solution for injection   Metoprolol Tartrate Oral tablet  What is this medicine?  METOPROLOL (me TOE proe lole) is a beta-blocker. Beta-blockers reduce the workload on the heart and help it to beat more regularly. This medicine is used to treat high blood pressure and to prevent chest pain. It is also used to after a heart attack and to prevent an additional heart attack from occurring.  This medicine may be used for other purposes; ask your health care provider or pharmacist if you have questions.  What should I tell my health care provider before I take this medicine?  They need to know if you have any of these conditions:   diabetes   heart or vessel disease like slow heart rate, worsening heart failure, heart block, sick sinus syndrome or Raynaud's disease   kidney disease   liver disease   lung or breathing disease, like asthma or emphysema   pheochromocytoma   thyroid disease   an unusual or allergic reaction to metoprolol, other beta-blockers, medicines, foods, dyes, or preservatives  pregnant or trying to get pregnant   breast-feeding  How should I use this medicine?  Take this medicine by mouth with a drink of water. Follow the directions on the prescription label. Take this medicine immediately after meals. Take your doses at regular intervals. Do not take more medicine than directed. Do not stop taking this medicine suddenly. This could lead to serious heart-related effects.  Talk to your pediatrician regarding the use of this medicine in children. Special care may be needed.  Overdosage: If you think you have  taken too much of this medicine contact a poison control center or emergency room at once.  NOTE: This medicine is only for you. Do not share this medicine with others.  What if I miss a dose?  If you miss a dose, take it as soon as you can. If it is almost time for your next dose, take only that dose. Do not take double or extra doses.  What may interact with this medicine?  Do not take this medicine with any of the following medications:   sotalol  This medicine may also interact with the following medications:   clonidine   digoxin   dobutamine   epinephrine   isoproterenol   medicine to control heart rhythm like quinidine, propafenone   medicine for depression like monoamine oxidase (MAO) inhibitors, fluoxetine, and paroxetine   medicine for high blood pressure like calcium channel blockers   reserpine  This list may not describe all possible interactions. Give your health care provider a list of all the medicines, herbs, non-prescription drugs, or dietary supplements you use. Also tell them if you smoke, drink alcohol, or use illegal drugs. Some items may interact with your medicine.  What should I watch for while using this medicine?  Visit your doctor or health care professional for regular check ups. Contact your doctor right away if your symptoms worsen. Check your blood pressure and pulse rate regularly. Ask your health care professional what your blood pressure and pulse rate should be, and when you should contact them.  You may get drowsy or dizzy. Do not drive, use machinery, or do anything that needs mental alertness until you know how this medicine affects you. Do not sit or stand up quickly, especially if you are an older patient. This reduces the risk of dizzy or fainting spells. Contact your doctor if these symptoms continue. Alcohol may interfere with the effect of this medicine. Avoid alcoholic drinks.  What side effects may I notice from receiving this medicine?  Side effects that you  should report to your doctor or health care professional as soon as possible:   allergic reactions like skin rash, itching or hives   cold or numb hands or feet   depression   difficulty breathing   faint   fever with sore throat   irregular heartbeat, chest pain   rapid weight gain   swollen legs or ankles  Side effects that usually do not require medical attention (report to your doctor or health care professional if they continue or are bothersome):   anxiety or nervousness   change in sex drive or performance   dry skin   headache   nightmares or trouble sleeping   short term memory loss   stomach upset or diarrhea   unusually tired  This list may not describe all possible side effects. Call your doctor for medical advice about side effects. You may report side effects to FDA at 1-800-FDA-1088.  Where  should I keep my medicine?  Keep out of the reach of children.  Store at room temperature between 15 and 30 degrees C (59 and 86 degrees F). Throw away any unused medicine after the expiration date.  NOTE:This sheet is a summary. It may not cover all possible information. If you have questions about this medicine, talk to your doctor, pharmacist, or health care provider. Copyright 2014 [employer]

## 2013-06-10 ENCOUNTER — Inpatient Hospital Stay
Admission: EM | Admit: 2013-06-10 | Discharge: 2013-06-13 | DRG: 202 | Disposition: A | Discharge status: Home Health | Payer: Medicare Other | Attending: Internal Medicine | Admitting: Internal Medicine

## 2013-06-10 ENCOUNTER — Emergency Department: Payer: Medicare Other

## 2013-06-10 ENCOUNTER — Inpatient Hospital Stay: Payer: Medicare Other | Admitting: Internal Medicine

## 2013-06-10 DIAGNOSIS — F0630 Mood disorder due to known physiological condition, unspecified: Secondary | ICD-10-CM | POA: Diagnosis present

## 2013-06-10 DIAGNOSIS — I10 Essential (primary) hypertension: Secondary | ICD-10-CM | POA: Diagnosis present

## 2013-06-10 DIAGNOSIS — K219 Gastro-esophageal reflux disease without esophagitis: Secondary | ICD-10-CM | POA: Diagnosis present

## 2013-06-10 DIAGNOSIS — R630 Anorexia: Secondary | ICD-10-CM | POA: Diagnosis present

## 2013-06-10 DIAGNOSIS — E785 Hyperlipidemia, unspecified: Secondary | ICD-10-CM | POA: Diagnosis present

## 2013-06-10 DIAGNOSIS — J40 Bronchitis, not specified as acute or chronic: Secondary | ICD-10-CM | POA: Diagnosis present

## 2013-06-10 DIAGNOSIS — F339 Major depressive disorder, recurrent, unspecified: Secondary | ICD-10-CM | POA: Diagnosis present

## 2013-06-10 DIAGNOSIS — F0280 Dementia in other diseases classified elsewhere without behavioral disturbance: Secondary | ICD-10-CM | POA: Diagnosis present

## 2013-06-10 DIAGNOSIS — H547 Unspecified visual loss: Secondary | ICD-10-CM | POA: Diagnosis present

## 2013-06-10 DIAGNOSIS — E7800 Pure hypercholesterolemia, unspecified: Secondary | ICD-10-CM | POA: Diagnosis present

## 2013-06-10 DIAGNOSIS — H9190 Unspecified hearing loss, unspecified ear: Secondary | ICD-10-CM | POA: Diagnosis present

## 2013-06-10 DIAGNOSIS — J45902 Unspecified asthma with status asthmaticus: Principal | ICD-10-CM | POA: Diagnosis present

## 2013-06-10 DIAGNOSIS — I2510 Atherosclerotic heart disease of native coronary artery without angina pectoris: Secondary | ICD-10-CM | POA: Diagnosis present

## 2013-06-10 LAB — CBC AND DIFFERENTIAL
Basophils Absolute Automated: 0.03 (ref 0.00–0.20)
Basophils Automated: 0 %
Eosinophils Absolute Automated: 0.21 (ref 0.00–0.70)
Eosinophils Automated: 2 %
Hematocrit: 34.4 % — ABNORMAL LOW (ref 37.0–47.0)
Hgb: 12 g/dL (ref 12.0–16.0)
Lymphocytes Absolute Automated: 0.91 (ref 0.50–4.40)
Lymphocytes Automated: 7 %
MCH: 30.8 pg (ref 28.0–32.0)
MCHC: 34.9 g/dL (ref 32.0–36.0)
MCV: 88.2 fL (ref 80.0–100.0)
MPV: 7.8 fL — ABNORMAL LOW (ref 9.4–12.3)
Monocytes Absolute Automated: 1.47 — ABNORMAL HIGH (ref 0.00–1.20)
Monocytes: 11 %
Neutrophils Absolute: 10.61 — ABNORMAL HIGH (ref 1.80–8.10)
Neutrophils: 80 %
Platelets: 324 (ref 140–400)
RBC: 3.9 — ABNORMAL LOW (ref 4.20–5.40)
RDW: 13 % (ref 12–15)
WBC: 13.23 — ABNORMAL HIGH (ref 3.50–10.80)

## 2013-06-10 LAB — TROPONIN I: Troponin I: 0.01 ng/mL (ref 0.00–0.09)

## 2013-06-10 LAB — B-TYPE NATRIURETIC PEPTIDE: B-Natriuretic Peptide: 87 pg/mL (ref 0–100)

## 2013-06-10 MED ORDER — NITROGLYCERIN 0.4 MG SL SUBL
0.4000 mg | SUBLINGUAL_TABLET | SUBLINGUAL | Status: AC
Start: 2013-06-10 — End: 2013-06-10
  Administered 2013-06-10: 0.4 mg via SUBLINGUAL
  Filled 2013-06-10: qty 1

## 2013-06-10 MED ORDER — BENZONATATE 100 MG PO CAPS
100.0000 mg | ORAL_CAPSULE | Freq: Once | ORAL | Status: AC
Start: 2013-06-10 — End: 2013-06-10
  Administered 2013-06-10: 100 mg via ORAL
  Filled 2013-06-10: qty 1

## 2013-06-10 MED ORDER — FUROSEMIDE 10 MG/ML IJ SOLN
20.0000 mg | Freq: Once | INTRAMUSCULAR | Status: AC
Start: 2013-06-10 — End: 2013-06-10
  Administered 2013-06-10: 20 mg via INTRAVENOUS
  Filled 2013-06-10: qty 4

## 2013-06-10 NOTE — ED Notes (Signed)
Pt reports cough, SOB since yesterday. Recently admitted to [facility] for chest pain last month. Having pain with coughing. Denies fevers at home.

## 2013-06-10 NOTE — ED Provider Notes (Signed)
EMERGENCY DEPARTMENT HISTORY AND PHYSICAL EXAM     Physician/Midlevel provider first contact with patient: 06/10/13 2320         Date: 06/10/2013  Patient Name: Jane [name]    History of Presenting Illness     Chief Complaint   Patient presents with   . Cough       History Provided By:pt, Patient's family members     Chief Complaint: cough   Onset: x 2-3 days ago   Timing: sporadic / progressive   Location: n/a   Quality: n/a   Severity: n/a  Modifying Factors: none   Associated Symptoms: SOB w/ exertion; denies CP     Additional History: Jane [name] is a 79 y.o. female w/ hx of CHF and chronic bronchitis BIB family members c/o sporadic, progressively worsening cough w/ associated SOB w/ exertion x 2-3 days ago. Pt was recently discharged from the hospital. Pt's family members note that pt's cough started when her medication was switched from Benicar to Lisinopril. They note that pt's cough has mucous but nothing comes out. They also state that pt went to physical therapy this AM and also went out for a walk today so has been more active than usual. They note that when pt returned home after her walk she was SOB. Pt's family members state pt does not c/o CP, problems urinating, or fever. They also deny changes in pt's leg swelling, hx of smoking, or intake of any breathing txs.      PCP: Burr Medico, MD      Current Facility-Administered Medications   Medication Dose Route Frequency Provider Last Rate Last Dose   . [COMPLETED] albuterol (PROVENTIL) nebulizer solution 2.5 mg  2.5 mg Nebulization Once Marquette Old, MD   2.5 mg at 06/11/13 0030   . [COMPLETED] benzonatate (TESSALON) capsule 100 mg  100 mg Oral Once Marquette Old, MD   100 mg at 06/10/13 2334   . [COMPLETED] furosemide (LASIX) injection 20 mg  20 mg Intravenous Once Marquette Old, MD   20 mg at 06/10/13 2335   . levofloxacin (LEVAQUIN) 500mg  in D5W IVPB (premix)  500 mg Intravenous Once Marquette Old, MD 100 mL/hr at 06/11/13 0117 500 mg at  06/11/13 0117   . [EXPIRED] nitroglycerin (NITROSTAT) SL tablet 0.4 mg  0.4 mg Sublingual Q5 Christell Constant, Wilber Oliphant, MD   0.4 mg at 06/10/13 2339     Current Outpatient Prescriptions   Medication Sig Dispense Refill   . aspirin EC 81 MG EC tablet Take 1 tablet (81 mg total) by mouth daily.  30 tablet  0   . carvedilol (COREG) 12.5 MG tablet Take 1 tablet (12.5 mg total) by mouth 2 (two) times daily with meals.  60 tablet  0   . clopidogrel (PLAVIX) 75 mg tablet Take 1 tablet (75 mg total) by mouth daily.  30 tablet  0   . furosemide (LASIX) 20 MG tablet Take 20 mg by mouth 2 (two) times daily.       Marland Kitchen guaiFENesin (MUCINEX) 600 MG 12 hr tablet Take 1,200 mg by mouth 2 (two) times daily.       . nitroglycerin (NITRODUR) 0.2 MG/HR Place 1 patch onto the skin daily.  30 patch  0   . olmesartan (BENICAR) 20 MG tablet Take 20 mg by mouth daily.       . calcium-vitamin D (OSCAL-500) 500-200 MG-UNIT per tablet Take 1 tablet by mouth daily.       Marland Kitchen  fluticasone-salmeterol (ADVAIR DISKUS) 100-50 MCG/DOSE AEPB Inhale 1 puff into the lungs.       . Multiple Vitamins-Minerals (CENTRUM SILVER PO) Take by mouth.       . sertraline (ZOLOFT) 50 MG tablet Take 50 mg by mouth daily.       . simvastatin (ZOCOR) 20 MG tablet Take 20 mg by mouth nightly.           Past History     Past Medical History:  Past Medical History   Diagnosis Date   . Arthritis    . Hypertensive disorder    . Lung disease      Bronchitis   . Syncope and collapse      Weak with Wt. loss has difficulty walking   . GERD (gastroesophageal reflux disease)    . Difficulty in walking(719.7)    . Cataracts, bilateral    . Vision abnormalities    . Hearing loss NEC    . Depression        Past Surgical History:  Past Surgical History   Procedure Date   . Upper gastrointestinal endoscopy        Family History:  History reviewed. No pertinent family history.    Social History:  History   Substance Use Topics   . Smoking status: Never Smoker    . Smokeless tobacco: Never Used   .  Alcohol Use: No       Allergies:  No Known Allergies    Review of Systems     Review of Systems   Constitutional: Negative for fever.   HENT: Negative for ear discharge.    Eyes: Negative for discharge.   Respiratory: Positive for cough and shortness of breath.    Cardiovascular: Negative for chest pain.   Genitourinary: Negative for dysuria, urgency, frequency and hematuria.   Skin: Negative for rash.   Neurological: Negative for tremors.   Endo/Heme/Allergies:        + NKDAs   Psychiatric/Behavioral: The patient is not nervous/anxious.          Physical Exam   BP 145/73  Pulse 100  Temp 98.4 F (36.9 C)  Resp 24  Ht 1.549 m  Wt 53.343 kg  BMI 22.23 kg/m2  SpO2 98%    Physical Exam   Constitutional: Patient is oriented to person, place, and time and well-developed, well-nourished, and in no distress. tachypneic  Head: Normocephalic and atraumatic.   Eyes: EOM are normal. Pupils are equal, round, and reactive to light. pink sunconj. No scleral icterus  ENT: OP clear, MMM  Neck: Normal range of motion. Neck supple. Unable to gauge for JVD as pt tachypneic and using her neck muscles  Cardiovascular: Normal rate and regular rhythm. No murmurs or rubs.  Pulmonary/Chest: Effort incr with audible wet BS, rhonchi at both bases with mild expir wheezing. mild respiratory distress.   Abdominal: Soft. There is no tenderness. No rebound or guarding.  Musculoskeletal: Normal range of motion. 2+ BLE pitting edema. No calf cords or TTP  Neurological: Patient is alert and oriented to person, place, and time. GCS score is 15.   Skin: Skin is warm and dry.         Diagnostic Study Results     Labs -     Results     Procedure Component Value Units Date/Time    GFR [[id number]] Collected:06/10/13 2357     EGFR >60.0 Updated:06/11/13 0038    Comprehensive metabolic panel [[id number]]  (Abnormal) Collected:06/10/13 2357  Glucose 136 (H) mg/dL ZOXWRUE:45/40/98 1191     BUN 10.0 mg/dL      Creatinine 0.7 mg/dL      Sodium 478 (L)       Potassium 3.8      Chloride 99      CO2 24      CALCIUM 8.8 mg/dL      Protein, Total 6.5 g/dL      Albumin 3.0 (L) g/dL      AST (SGOT) 25 U/L      ALT 23 U/L      Alkaline Phosphatase 58 U/L      Bilirubin, Total 0.4 mg/dL      Globulin 3.5 g/dL      Albumin/Globulin Ratio 0.9      Anion Gap 11.0     Blood Culture #1 [[id number]] Collected:06/10/13 2357    Specimen Information:Blood / Blood Updated:06/11/13 0010    Narrative:    8ml required    Blood Culture #2 [[id number]] Collected:06/10/13 2357    Specimen Information:Blood / Blood Updated:06/11/13 0010    Narrative:    8ml required    CBC and differential [[id number]]  (Abnormal) Collected:06/10/13 2319    Specimen Information:Blood / Blood Updated:06/10/13 2351     WBC 13.23 (H)      RBC 3.90 (L)      Hgb 12.0 g/dL      Hematocrit 40.1 (L) %      MCV 88.2 fL      MCH 30.8 pg      MCHC 34.9 g/dL      RDW 13 %      Platelets 324      MPV 7.8 (L) fL      Neutrophils 80 %      Lymphocytes Automated 7 %      Monocytes 11 %      Eosinophils Automated 2 %      Basophils Automated 0 %      Immature Granulocyte Unmeasured %      Neutrophils Absolute 10.61 (H)      Abs Lymph Automated 0.91      Abs Mono Automated 1.47 (H)      Abs Eos Automated 0.21      Absolute Baso Automated 0.03      Absolute Immature Granulocyte Unmeasured     Troponin I [[id number]] Collected:06/10/13 2319    Specimen Information:Blood Updated:06/10/13 2350     Troponin I 0.01 ng/mL     B-type Natriuretic Peptide (BNP) [[id number]] Collected:06/10/13 2319    Specimen Information:Blood Updated:06/10/13 2349     B-Natriuretic Peptide 87 pg/mL           Radiologic Studies -   Radiology Results (24 Hour)     Procedure Component Value Units Date/Time    Chest AP Portable [[id number]]  (Normal) Resulted:06/11/13 0112    Order Status:Completed  Updated:06/11/13 0112      .      Medical Decision Making   I am the first provider for this patient.    I reviewed the vital signs, available nursing notes, past  medical history, past surgical history, family history and social history.    Vital Signs-Reviewed the patient's vital signs.    Pulse Oximetry Analysis - Normal 95% on RA    Cardiac Monitor:  Rate: 102 bpm   Rhythm:  Sinus Tachycardia     EKG:  Interpreted by the Emergency Physician.   Time Interpreted: 2316   Rate: 107 bpm  Rhythm: Sinus tach   Interpretation: Less than 0.5 mm ST depression in II aVF V3 - V5; No ST elevations     Comparison: Compared to prior EKG 05/13/2013      Old Medical Records: Nursing notes   Old medical records reviewed   Pt was seen at the [facility] ED on 05/13/2013 and discharged on 05/18/2013 by Dr. Graciela Husbands.   He was tx for CP, 2nd to muti-vessel coronary disease and generalized weakness. Pt started on Coreg, Lisinopril, and Plavix. Seen by Dr. Philip Aspen, cardiologist. Had a cardiac cath on 05/17/2013 that showed LAD 70% eclusion RSA 50-70 % stenosis. L main calcification 40% in distal L main. Family and pt refused bypass surgery and elected on medical tx. Echo showed normal EF     ED Course:   12:31 AM - Informed pt's family that we are waiting on CT.     1:13 AM - Paged Dr. Johnsie Kindred (PMD)    1:14 AM - Updated pt and pt's family on X-ray results. Pt feels improved. Still wheezing, but does not want any more breathing tx     1:19 AM - Dr. Johnsie Kindred, internal medicine, paged back; discussed pt case, will admit. Agrees w/ Duoneb every 4-6 hrs.      Provider Notes: 79 yo woman with CAD (including L main) who opted for med therapy and h/o bronchitis, not a smoker, brought by family for worsening nonprod cough and exertional SOB with no CP. On exam, hypertensive, tachypneic with incr WOB. BS coarse bilat. BNP < 100 with recent ech neg with normal EF so not CHF at this time. No PNA by cxr. With rhonchi, respir distress, and cough, will tx with levaquin for bronchitis, as well as nebs. Will admit as pt had incr WOB with multiple med comorbidities.    Core Measures:   ASA and Plavix taken  today.    Diagnosis     Clinical Impression:   1. Bronchitis    2. Essential (primary) hypertension        _______________________________    Attestations:  This note is prepared by Rickey Primus, acting as Scribe for Dr. Dory Peru, MD.    Dr. Dory Peru, MD. The scribe's documentation has been prepared under my direction and personally reviewed by me in its entirety.  I confirm that the note above accurately reflects all work, treatment, procedures, and medical decision making performed by me.    _______________________________            Marquette Old, MD  06/14/13 [phone]

## 2013-06-11 LAB — CBC AND DIFFERENTIAL
Basophils Absolute Automated: 0.02 (ref 0.00–0.20)
Basophils Automated: 0 %
Eosinophils Absolute Automated: 0.12 (ref 0.00–0.70)
Eosinophils Automated: 1 %
Hematocrit: 31.5 % — ABNORMAL LOW (ref 37.0–47.0)
Hgb: 10.9 g/dL — ABNORMAL LOW (ref 12.0–16.0)
Immature Granulocytes Absolute: 0.06 — ABNORMAL HIGH
Immature Granulocytes: 1 %
Lymphocytes Absolute Automated: 1.08 (ref 0.50–4.40)
Lymphocytes Automated: 10 %
MCH: 30 pg (ref 28.0–32.0)
MCHC: 34.6 g/dL (ref 32.0–36.0)
MCV: 86.8 fL (ref 80.0–100.0)
MPV: 8.2 fL — ABNORMAL LOW (ref 9.4–12.3)
Monocytes Absolute Automated: 0.88 (ref 0.00–1.20)
Monocytes: 8 %
Neutrophils Absolute: 8.59 — ABNORMAL HIGH (ref 1.80–8.10)
Neutrophils: 80 %
Nucleated RBC: 0 (ref 0–1)
Platelets: 231 (ref 140–400)
RBC: 3.63 — ABNORMAL LOW (ref 4.20–5.40)
RDW: 13 % (ref 12–15)
WBC: 10.69 (ref 3.50–10.80)

## 2013-06-11 LAB — COMPREHENSIVE METABOLIC PANEL
ALT: 14 U/L (ref 0–55)
ALT: 23 U/L (ref 0–55)
AST (SGOT): 16 U/L (ref 5–34)
AST (SGOT): 25 U/L (ref 5–34)
Albumin/Globulin Ratio: 0.9 (ref 0.9–2.2)
Albumin/Globulin Ratio: 0.9 (ref 0.9–2.2)
Albumin: 2.3 g/dL — ABNORMAL LOW (ref 3.5–5.0)
Albumin: 3 g/dL — ABNORMAL LOW (ref 3.5–5.0)
Alkaline Phosphatase: 50 U/L (ref 40–150)
Alkaline Phosphatase: 58 U/L (ref 40–150)
Anion Gap: 11 (ref 5.0–15.0)
Anion Gap: 8 (ref 5.0–15.0)
BUN: 10 mg/dL (ref 7.0–19.0)
BUN: 9 mg/dL (ref 7.0–19.0)
Bilirubin, Total: 0.3 mg/dL (ref 0.2–1.2)
Bilirubin, Total: 0.4 mg/dL (ref 0.2–1.2)
CO2: 24 (ref 22–29)
CO2: 28 (ref 22–29)
Calcium: 8 mg/dL (ref 7.9–10.6)
Calcium: 8.8 mg/dL (ref 7.9–10.6)
Chloride: 99 (ref 98–107)
Chloride: 99 (ref 98–107)
Creatinine: 0.7 mg/dL (ref 0.6–1.0)
Creatinine: 0.8 mg/dL (ref 0.6–1.0)
Globulin: 2.7 g/dL (ref 2.0–3.6)
Globulin: 3.5 g/dL (ref 2.0–3.6)
Glucose: 136 mg/dL — ABNORMAL HIGH (ref 70–100)
Glucose: 215 mg/dL — ABNORMAL HIGH (ref 70–100)
Potassium: 3.2 — ABNORMAL LOW (ref 3.5–5.1)
Potassium: 3.8 (ref 3.5–5.1)
Protein, Total: 5 g/dL — ABNORMAL LOW (ref 6.0–8.3)
Protein, Total: 6.5 g/dL (ref 6.0–8.3)
Sodium: 134 — ABNORMAL LOW (ref 136–145)
Sodium: 135 — ABNORMAL LOW (ref 136–145)

## 2013-06-11 LAB — GFR
EGFR: >60
EGFR: >60

## 2013-06-11 LAB — TROPONIN I
Troponin I: <0.01 ng/mL (ref 0.00–0.09)
Troponin I: 0.02 ng/mL (ref 0.00–0.09)
Troponin I: 0.04 ng/mL (ref 0.00–0.09)

## 2013-06-11 LAB — HEMOLYSIS INDEX: Hemolysis Index: 5 (ref 0–18)

## 2013-06-11 MED ORDER — CLOPIDOGREL BISULFATE 75 MG PO TABS
75.00 mg | ORAL_TABLET | Freq: Every day | ORAL | Status: DC
Start: 2013-06-11 — End: 2013-06-13
  Administered 2013-06-11 – 2013-06-13 (×3): 75 mg via ORAL
  Filled 2013-06-11 (×3): qty 1

## 2013-06-11 MED ORDER — POLYETHYLENE GLYCOL 3350 17 G PO PACK
17.00 g | PACK | Freq: Every day | ORAL | Status: DC | PRN
Start: 2013-06-11 — End: 2013-06-13

## 2013-06-11 MED ORDER — ASPIRIN 81 MG PO CHEW
81.0000 mg | CHEWABLE_TABLET | Freq: Every day | ORAL | Status: DC
Start: 2013-06-11 — End: 2013-06-13
  Administered 2013-06-11 – 2013-06-13 (×3): 81 mg via ORAL
  Filled 2013-06-11 (×3): qty 1

## 2013-06-11 MED ORDER — SODIUM CHLORIDE 0.9 % IV MBP
1.0000 g | INTRAVENOUS | Status: DC
Start: 2013-06-11 — End: 2013-06-13
  Administered 2013-06-11 – 2013-06-12 (×2): 1 g via INTRAVENOUS
  Filled 2013-06-11 (×3): qty 1000

## 2013-06-11 MED ORDER — FLUTICASONE-SALMETEROL 100-50 MCG/DOSE IN AEPB
1.0000 | INHALATION_SPRAY | Freq: Two times a day (BID) | RESPIRATORY_TRACT | Status: DC
Start: 2013-06-11 — End: 2013-06-13
  Administered 2013-06-12 – 2013-06-13 (×3): 1 via RESPIRATORY_TRACT
  Filled 2013-06-11: qty 14

## 2013-06-11 MED ORDER — AZITHROMYCIN 250 MG PO TABS
250.0000 mg | ORAL_TABLET | Freq: Every day | ORAL | Status: DC
Start: 2013-06-12 — End: 2013-06-13
  Administered 2013-06-12 – 2013-06-13 (×2): 250 mg via ORAL
  Filled 2013-06-11 (×2): qty 1

## 2013-06-11 MED ORDER — ONDANSETRON HCL 4 MG/2ML IJ SOLN
4.00 mg | Freq: Three times a day (TID) | INTRAMUSCULAR | Status: DC | PRN
Start: 2013-06-11 — End: 2013-06-13

## 2013-06-11 MED ORDER — FLEET ENEMA 7-19 GM/118ML RE ENEM
1.00 | ENEMA | Freq: Once | RECTAL | Status: DC | PRN
Start: 2013-06-11 — End: 2013-06-13

## 2013-06-11 MED ORDER — ALBUTEROL-IPRATROPIUM 2.5-0.5 (3) MG/3ML IN SOLN
3.0000 mL | Freq: Four times a day (QID) | RESPIRATORY_TRACT | Status: DC
Start: 2013-06-11 — End: 2013-06-13
  Administered 2013-06-11 – 2013-06-13 (×10): 3 mL via RESPIRATORY_TRACT
  Filled 2013-06-11 (×8): qty 3

## 2013-06-11 MED ORDER — NALOXONE HCL 0.4 MG/ML IJ SOLN
0.2000 mg | INTRAMUSCULAR | Status: DC | PRN
Start: 2013-06-11 — End: 2013-06-13

## 2013-06-11 MED ORDER — GUAIFENESIN ER 600 MG PO TB12
1200.0000 mg | ORAL_TABLET | Freq: Two times a day (BID) | ORAL | Status: DC
Start: 2013-06-11 — End: 2013-06-13
  Administered 2013-06-11 – 2013-06-13 (×4): 1200 mg via ORAL
  Filled 2013-06-11 (×4): qty 2

## 2013-06-11 MED ORDER — CARVEDILOL 6.25 MG PO TABS
6.2500 mg | ORAL_TABLET | Freq: Once | ORAL | Status: DC
Start: 2013-06-11 — End: 2013-06-11

## 2013-06-11 MED ORDER — LEVOFLOXACIN IN D5W 500 MG/100ML IV SOLN
500.0000 mg | Freq: Once | INTRAVENOUS | Status: AC
Start: 2013-06-11 — End: 2013-06-11
  Administered 2013-06-11: 500 mg via INTRAVENOUS
  Filled 2013-06-11: qty 100

## 2013-06-11 MED ORDER — FUROSEMIDE 20 MG PO TABS
20.0000 mg | ORAL_TABLET | Freq: Two times a day (BID) | ORAL | Status: DC
Start: 2013-06-11 — End: 2013-06-11
  Administered 2013-06-11: 20 mg via ORAL
  Filled 2013-06-11: qty 1

## 2013-06-11 MED ORDER — BENZONATATE 100 MG PO CAPS
100.00 mg | ORAL_CAPSULE | Freq: Three times a day (TID) | ORAL | Status: DC | PRN
Start: 2013-06-11 — End: 2013-06-13
  Administered 2013-06-11 – 2013-06-13 (×2): 100 mg via ORAL
  Filled 2013-06-11 (×2): qty 1

## 2013-06-11 MED ORDER — ASPIRIN 81 MG PO TBEC
81.00 mg | DELAYED_RELEASE_TABLET | Freq: Every day | ORAL | Status: DC
Start: 2013-06-11 — End: 2013-06-11

## 2013-06-11 MED ORDER — ENOXAPARIN SODIUM 40 MG/0.4ML SC SOLN
40.0000 mg | Freq: Every day | SUBCUTANEOUS | Status: DC
Start: 2013-06-11 — End: 2013-06-13
  Administered 2013-06-11 – 2013-06-13 (×3): 40 mg via SUBCUTANEOUS
  Filled 2013-06-11 (×3): qty 0.4

## 2013-06-11 MED ORDER — SIMVASTATIN 10 MG PO TABS
20.0000 mg | ORAL_TABLET | Freq: Every evening | ORAL | Status: DC
Start: 2013-06-11 — End: 2013-06-13
  Administered 2013-06-11 – 2013-06-12 (×2): 20 mg via ORAL
  Filled 2013-06-11 (×2): qty 2

## 2013-06-11 MED ORDER — CARVEDILOL 12.5 MG PO TABS
12.5000 mg | ORAL_TABLET | Freq: Two times a day (BID) | ORAL | Status: DC
Start: 2013-06-11 — End: 2013-06-13
  Administered 2013-06-11 – 2013-06-13 (×4): 12.5 mg via ORAL
  Filled 2013-06-11 (×4): qty 1

## 2013-06-11 MED ORDER — FUROSEMIDE 20 MG PO TABS
20.0000 mg | ORAL_TABLET | Freq: Two times a day (BID) | ORAL | Status: DC
Start: 2013-06-11 — End: 2013-06-13
  Administered 2013-06-11 – 2013-06-13 (×4): 20 mg via ORAL
  Filled 2013-06-11 (×4): qty 1

## 2013-06-11 MED ORDER — METHYLPREDNISOLONE SODIUM SUCC 125 MG IJ SOLR
80.0000 mg | Freq: Three times a day (TID) | INTRAMUSCULAR | Status: DC
Start: 2013-06-11 — End: 2013-06-12
  Administered 2013-06-11 – 2013-06-12 (×4): 80 mg via INTRAVENOUS
  Filled 2013-06-11 (×4): qty 2

## 2013-06-11 MED ORDER — AZITHROMYCIN 250 MG PO TABS
500.0000 mg | ORAL_TABLET | Freq: Once | ORAL | Status: AC
Start: 2013-06-11 — End: 2013-06-11
  Administered 2013-06-11: 500 mg via ORAL
  Filled 2013-06-11: qty 1

## 2013-06-11 MED ORDER — NITROGLYCERIN 0.4 MG/HR TD PT24
1.00 | MEDICATED_PATCH | TRANSDERMAL | Status: DC
Start: 2013-06-11 — End: 2013-06-13
  Administered 2013-06-11 – 2013-06-13 (×3): 1 via TRANSDERMAL
  Filled 2013-06-11 (×3): qty 1

## 2013-06-11 MED ORDER — CLOPIDOGREL BISULFATE 75 MG PO TABS
75.00 mg | ORAL_TABLET | Freq: Every day | ORAL | Status: DC
Start: 2013-06-11 — End: 2013-06-11

## 2013-06-11 MED ORDER — ACETAMINOPHEN 325 MG PO TABS
650.00 mg | ORAL_TABLET | Freq: Four times a day (QID) | ORAL | Status: DC | PRN
Start: 2013-06-11 — End: 2013-06-13

## 2013-06-11 MED ORDER — ALBUTEROL SULFATE (2.5 MG/3ML) 0.083% IN NEBU
2.5000 mg | INHALATION_SOLUTION | Freq: Once | RESPIRATORY_TRACT | Status: AC
Start: 2013-06-11 — End: 2013-06-11
  Administered 2013-06-11: 2.5 mg via RESPIRATORY_TRACT
  Filled 2013-06-11: qty 3

## 2013-06-11 MED ORDER — ONDANSETRON HCL 4 MG/2ML IJ SOLN
4.0000 mg | Freq: Three times a day (TID) | INTRAMUSCULAR | Status: DC | PRN
Start: 2013-06-11 — End: 2013-06-11

## 2013-06-11 MED ORDER — ACETAMINOPHEN 325 MG PO TABS
650.00 mg | ORAL_TABLET | ORAL | Status: DC | PRN
Start: 2013-06-11 — End: 2013-06-13

## 2013-06-11 MED ORDER — DOCUSATE SODIUM 100 MG PO CAPS
100.0000 mg | ORAL_CAPSULE | Freq: Two times a day (BID) | ORAL | Status: DC | PRN
Start: 2013-06-11 — End: 2013-06-13

## 2013-06-11 MED ORDER — ACETAMINOPHEN 325 MG PO TABS
650.0000 mg | ORAL_TABLET | Freq: Four times a day (QID) | ORAL | Status: DC | PRN
Start: 2013-06-11 — End: 2013-06-11

## 2013-06-11 MED ORDER — CARVEDILOL 6.25 MG PO TABS
6.2500 mg | ORAL_TABLET | Freq: Two times a day (BID) | ORAL | Status: DC
Start: 2013-06-11 — End: 2013-06-11
  Administered 2013-06-11: 6.25 mg via ORAL
  Filled 2013-06-11: qty 1

## 2013-06-11 NOTE — Plan of Care (Signed)
Problem: Inadequate Gas Exchange: Respiratory (Peds)  Goal: Adequate tissue perfusion will be maintained  Intervention: Administer medications as ordered  Breath sounds diminished to the bases 02 sats 94% Ra. Breathing tx sched every 6hr  No resp distress noted at this time.

## 2013-06-11 NOTE — Treatment Plan (Signed)
Severe Sepsis Screen  Date: 06/11/2013 Time: 8:28 AM  Nurse Signature: Courtney Fenlon P Zong Mcquarrie    A. Infection:    Does your patient have ONE or more of the following infection criteria?     []  Documented Infection - Does the patient have positive culture results (from blood,        sputum, urine, etc)?   []  Anti-Infective Therapy - Is the patient receiving antibiotic, antifungal, or other                anti-infective therapy?   []  Pneumonia - Is there documentation of pneumonia (X Ray, etc)?   [x]  WBC's - Have WBC's been found in normally sterile fluid (urine, CSF, etc.)?   []  Perforated Viscus -Does the patient have a perforated hollow organ (bowel)?    A.  Did you check any of the boxes above?    []  No  If No, Stop Here and Sepsis Screen Negative.               [x]  Yes, continue to section B      B. SIRS:     Does your patient have TWO or more of the following SIRS criteria (ensure vital signs & temperature are within 1 hour of this screening)?    []  Temperature - Is the patient's temperature: Temp: 97.5 F (36.4 C) (06/11/13 0243)   - Greater than or equal to 38.3 degrees C (greater than 100.9 degrees F)?   - Less than or equal to 36 degrees C (less than or equal to 96.8 degrees F)?    [x]  Heart Rate: Heart Rate: 100  (06/11/13 0817)   - Is the patient's heart rate greater than or equal to 90 bpm?    []  Respiratory: Resp Rate: 20  (06/11/13 0817)   - Is the patient's respiratory rate greater than or equal to 20?    [x]  WBC Count - Is the patient's WBC count:   Lab 06/10/13 2319   WBC 13.23*      - Greater than or equal to 12,000/mm3 OR   - Less than or equal to 4,000/mm3 OR    - Are there greater than 10% immature neutrophils (bands)?    []  Glucose >140 without diabetes?   Lab 06/10/13 2357   GLU 136*       []  Significant edema?    B.  Did you check two or more of the boxes above?     []  No, Stop Here and Sepsis Screen Negative   [x]  Yes, continue to section C    C.  Acute Organ Dysfunction     Does your patient have  ONE or more of the following organ dysfunction? (May need to wait for lab results for assessment - see below) Organ dysfunction must be a result of the sepsis not chronic conditions.    []  Cardiovascular - Does the patient have a: BP: 143/54 mmHg (06/11/13 0817)   -systolic Blood pressure less than or equal to 90 mmHg OR   -systolic blood pressure has dropped 40 mmHg or more from baseline OR   -mean arterial pressure less than or equal to 70 mmHg (for at least one hour   despite fluid resuscitation OR require vasopressor support?  []  Respiratory - Does the patient have new hypoxia defined by any of the following"   -A sustained increase in oxygen requirements by at least 2L/min on NC or 28%    FiO2 within the last 24 hrs OR   -  A persistent decrease in oxygen saturation of greater than or equal to 5% lasting   at least four or more hours and occurring within the last 24 hours  []  Renal - Does the patient have:   -low urine output (e.g. Less than 0.93mL/kg/HR for one hour despite adequate fluid    resuscitation, OR   -Increased creatinine (greater than 50% increase from baseline) OR   -require acute dialysis?  []  Hematologic - Does the patient have a:   -Low platelet count (less than 100,000 mm3)   Lab 06/10/13 2319   PLT 324   OR   -INR/aPTT greater than upper limit of normal?No results found for this basename: INR:1 in the last 168 hours or                No results found for this basename: APTT:1 in the last 168 hours  []  Metabolic - Does the patient have a high lactate (plasma lactate greater than or equal to 2.4 mMol/L)? No results found for this basename: LACTATE:5 in the last 168 hours    []  Hepatic - Are the patient's liver enzymes elevated (ALT greater than 72 IU/L or Total      Bilirubin greater than 2 MG/dL)?    Lab 06/10/13 2357   BILITOTAL 0.4   ALT 23     []  CNS - Does the patient have altered consciousness or reduced Glasgow Coma      Scale?    C.  Did you check any of the boxes above?     [x]  No, Sepsis  Screen Negative   []  YES:  A) Infection + B) SIRS + C) Organ Dysfunction = Positive Screen for Severe Sepsis     Notify MD and document in Complex Assessment under provider notification   - Name of physician notified:                                           - Date/Time Notifiied:                                             Document actions: Following must be completed within 1 hour of positive sepsis screen   []  Lactate drawn (if initial lactate > , repeat lactate in 2 hours for goal decrease 10-20%)   []  Blood Cultures obtained (prior to antibiotic administration; if not done within the last 48 hours)   []  Antibiotics initiated or modified    []   IV Fluid administered 0.9% NS __________ mLs given (Initial Bolus of 30 ml/kg if SBP < 90 or MAP < 65 or lactate greater than 4 mmol/dl)  Nursing Comments?:     _________________________________________________________________    Patients meeting the following criteria are excluded from screening (check if applicable):   []  [place] hypothermia protocol   []  Comfort Care orders   []  Surgery- No screening for 24 hours after surgery (48 hours after CV surgery)       -If Yes. Date of surgery:______________________   []  Admitted with sepsis and until 72 hours after admission with documented sepsis (RESUME SEPSIS SCREEN AFTER 72 hour window!!!)      -If Yes, Date of Documented Sepsis:______________________   []  Positive screen AND Completed sepsis bundle during previous 24 hours       -  If Yes, Date/Time of positive severe sepsis screen:_______________________

## 2013-06-11 NOTE — Plan of Care (Signed)
PT ALERT AND  ORIENTED  X 3.  DISCUSSED  POC  WITH  PT  AND  FAMILY  WITH  OFF GOING R.N .  MONITOR  TRIPONEN  LEVEL  .  Assist  To  Bathroom   As  Needed.

## 2013-06-11 NOTE — Treatment Plan (Signed)
VTE/PE Risk Screening  Complete Upon Admission and Transfer to Different Level of Care  Completed by nurse: Dawson Bills 06/11/2013 6:48 AM   -----------------------------------------------------------------------------------------------------------  SECTION 1 - Risk Screening     []   Patient currently receiving anticoagulation therapy (Heparin, Lovenox, Coumadin, Pradaxa, Xarelto, or Arixtra Only) and received 1 dose within 24 hours of admission STOP HERE   []   VTE/PE prophylaxis currently prescribed elsewhere - STOP HERE   []   Comfort Care - STOP HERE   []   Clinical Trials - STOP HERE    Contraindications: Patients with a history of the following conditions cannot haveSequential compression devices (SCD     []  Any of these conditions present , Call MD for pharmacological prophylaxis or ask MD to document reason for not having both mechanical and pharmacologic VTE prophylaxis   []  Post-op vein ligation   []  Suspected VTE   []  Cellulitis/Dermatitis of the leg   []  Severe ischemic Vascular disease   []  Edema related to Congestive Heart Faliure   []  Gangrene   []  Recent skin graft  -----------------------------------------------------------------------------------------------------------  SECTION 2 - Risk Factors (Check all that apply)    Moderate Risk Factors   []   Heart Failure (current or history of)   []   Respiratory Failure   []   Acute Myocardial Infarction (AMI)   [x]   Acute Infection   []   Rheumatologic Disorder   []   Elderly age (79 years old)   []   Ongoing hormonal treatment / estrogen use (including Tamoxifen, Raloxifene)   []   Obesity (BMI >/= 30kg/m2)    High Risk Factors   []   Recent (</= 1 month) trauma/surgery    Highest Risk Factors   []   Active Cancer   []   Previous VTE   [x]   Reduced mobility (>24 hrs; current or anticipated)   []   Known thrombophilic condition (hematological disorders that promote thrombosis)      []   No boxes checked in this section indicate patient is at low risk for VTE. No VTE  Prophylaxis indicated.      * No surgery found * [x]   One or more risk factors present, enter an EPIC order for Sequential compression devices (SCD). Use per protocol, MD signature required.

## 2013-06-11 NOTE — Progress Notes (Signed)
Patient arrived to the unit from [facility] transportation. Patient arrived with daughter and son. Family oriented to the unit and room. Patient placed on cardiac monitor Heart rate 88-90.  Breath sounds diminished to the bases. 02 sats 94% on RA. Patient placed on 2l/nc. No respiratory distress noted at this time.

## 2013-06-11 NOTE — H&P (Signed)
History and Physical Exam  Covering Dr. Johnsie Kindred  Date Time: 06/11/2013 3:12 PM  Patient Name: Jane [name]  Attending Physician: Burr Medico, MD    Assessment:   1. Acute Asthmatic Bronchitis  2. History of Asthma  3. CAD on Medical management  4. HTN  5. HLD  6. Poor appetite  Plan:   1. Admit to [facility] inpatient status  2. Will give IV solumedrol, empiric Antibiotics  3. Nebulizers PRN  4. Continue home medications  5. DVT/GI prophylaxis  6. Discussed with patient and family in detail.  7. Further recommendations as per the course in the hospital.     Chief Complaint     Cough and shortness of breath  History of Presenting Illness:   Jane [name] is a 79 y.o. female  presented  to the Emergency room with above complaints.  States, the symptoms started past few days. Gives history of coughing spell, which results in severe shortness of breath. No phlegm, no fevers. No prior similar episode. No sick contacts.  Denies  chest pain, has some burning sensation in the chest. No headache or  no dizziness. Was seen and evaluated in the ER. Received nebs and IV antibiotics. Admitted for further workup and management.      Past Medical History:     Past Medical History   Diagnosis Date   . Arthritis    . Hypertensive disorder    . Lung disease      Bronchitis   . Syncope and collapse      Weak with Wt. loss has difficulty walking   . GERD (gastroesophageal reflux disease)    . Difficulty in walking(719.7)    . Cataracts, bilateral    . Vision abnormalities    . Hearing loss NEC    . Depression        Past Surgical History:     Past Surgical History   Procedure Date   . Upper gastrointestinal endoscopy        Family History:   History reviewed. No pertinent family history.    Social History:     History     Social History   . Marital Status: Widowed     Spouse Name: N/A     Number of Children: N/A   . Years of Education: N/A     Social History Main Topics   . Smoking status: Never Smoker    . Smokeless tobacco: Never  Used   . Alcohol Use: No   . Drug Use:    . Sexually Active:      Other Topics Concern   . Not on file     Social History Narrative   . No narrative on file       Allergies:   No Known Allergies    Medications:     Prescriptions prior to admission   Medication Sig   . aspirin EC 81 MG EC tablet Take 1 tablet (81 mg total) by mouth daily.   . calcium-vitamin D (OSCAL-500) 500-200 MG-UNIT per tablet Take 1 tablet by mouth daily.   . carvedilol (COREG) 12.5 MG tablet Take 1 tablet (12.5 mg total) by mouth 2 (two) times daily with meals.   . clopidogrel (PLAVIX) 75 mg tablet Take 1 tablet (75 mg total) by mouth daily.   . fluticasone-salmeterol (ADVAIR DISKUS) 100-50 MCG/DOSE AEPB Inhale 1 puff into the lungs.   . furosemide (LASIX) 20 MG tablet Take 20 mg by mouth 2 (two)  times daily.   Marland Kitchen guaiFENesin (MUCINEX) 600 MG 12 hr tablet Take 1,200 mg by mouth 2 (two) times daily.   . Multiple Vitamins-Minerals (CENTRUM SILVER PO) Take by mouth.   . nitroglycerin (NITRODUR) 0.2 MG/HR Place 1 patch onto the skin daily.   Marland Kitchen olmesartan (BENICAR) 20 MG tablet Take 20 mg by mouth daily.   . simvastatin (ZOCOR) 20 MG tablet Take 20 mg by mouth nightly.   . sodium hypochlorite (DAKIN'S HALF-STRENGTH) external solution Apply topically once.   . sertraline (ZOLOFT) 50 MG tablet Take 50 mg by mouth daily.                                                         Review of Systems:   Constitutional: Negative for fever, chills, weight loss, malaise/fatigue and diaphoresis.   HEENT: Negative.   Respiratory: coughing spell, shortness of breath  Cardiovascular: No Chest pain. No shortness of breath. Negative for palpitations.   Gastrointestinal: burning sensation in the chest. No nausea, vomiting, diarrhea or  constipation.   Genitourinary: No burning micturition.   Musculoskeletal: Negative.   Skin: Negative.   Neurological: Denies headache, no nausea.Negative for weakness.   Endo/Heme/Allergies: Does not bruise/bleed easily.  Physical Exam:      Filed Vitals:    06/11/13 1152   BP: 118/64   Pulse: 90   Temp: 98 F (36.7 C)   Resp: 20   SpO2: 96%        Temp (24hrs), Avg:98.2 F (36.8 C), Min:97.5 F (36.4 C), Max:98.7 F (37.1 C)    General Appearance: No acute distress.  Head:  Normocephalic  Eyes:  No Pallor, Cyanosis, icterus, PERLA, EOM's intact.  Neck:  Supple, No JVD, No lymphadenopathy  Lungs:  Expiratory wheezes+  Heart:  S1, S2 normal, no murmurs.  Abdomen:  Soft, non-tender, non-distended, positive bowel sounds.  Extremities:  No cyanosis, clubbing or edema.  Neurologic:  Alert and oriented x3, mood and affect normal. Cranial Nerves II-XII grossly intact.  Labs:.      Results     Procedure Component Value Units Date/Time    Troponin I [[id number]] Collected:06/11/13 1338    Specimen Information:Blood Updated:06/11/13 1409     Troponin I 0.02 ng/mL     Troponin I [[id number]] Collected:06/11/13 0531    Specimen Information:Blood Updated:06/11/13 0624     Troponin I <0.01 ng/mL     Blood Culture #1 [[id number]] Collected:06/10/13 2357    Specimen Information:Blood / Blood Updated:06/11/13 0320    Narrative:    8ml required    Blood Culture #2 [[id number]] Collected:06/10/13 2357    Specimen Information:Blood / Blood Updated:06/11/13 0320    Narrative:    8ml required    GFR [[id number]] Collected:06/10/13 2357     EGFR >60.0 Updated:06/11/13 0038    Comprehensive metabolic panel [[id number]]  (Abnormal) Collected:06/10/13 2357     Glucose 136 (H) mg/dL ZDGUYQI:34/74/25 9563     BUN 10.0 mg/dL      Creatinine 0.7 mg/dL      Sodium 875 (L)      Potassium 3.8      Chloride 99      CO2 24      CALCIUM 8.8 mg/dL      Protein, Total 6.5 g/dL      Albumin 3.0 (L) g/dL  AST (SGOT) 25 U/L      ALT 23 U/L      Alkaline Phosphatase 58 U/L      Bilirubin, Total 0.4 mg/dL      Globulin 3.5 g/dL      Albumin/Globulin Ratio 0.9      Anion Gap 11.0     CBC and differential [[id number]]  (Abnormal) Collected:06/10/13 2319    Specimen Information:Blood / Blood  Updated:06/10/13 2351     WBC 13.23 (H)      RBC 3.90 (L)      Hgb 12.0 g/dL      Hematocrit 47.8 (L) %      MCV 88.2 fL      MCH 30.8 pg      MCHC 34.9 g/dL      RDW 13 %      Platelets 324      MPV 7.8 (L) fL      Neutrophils 80 %      Lymphocytes Automated 7 %      Monocytes 11 %      Eosinophils Automated 2 %      Basophils Automated 0 %      Immature Granulocyte Unmeasured %      Neutrophils Absolute 10.61 (H)      Abs Lymph Automated 0.91      Abs Mono Automated 1.47 (H)      Abs Eos Automated 0.21      Absolute Baso Automated 0.03      Absolute Immature Granulocyte Unmeasured     Troponin I [[id number]] Collected:06/10/13 2319    Specimen Information:Blood Updated:06/10/13 2350     Troponin I 0.01 ng/mL     B-type Natriuretic Peptide (BNP) [[id number]] Collected:06/10/13 2319    Specimen Information:Blood Updated:06/10/13 2349     B-Natriuretic Peptide 87 pg/mL         Rads:     Radiology Results (24 Hour)     Procedure Component Value Units Date/Time    Chest AP Portable [[id number]] Collected:06/11/13 0800    Order Status:Completed  Updated:06/11/13 4010    Narrative:    HISTORY: Cough     COMPARISON: 05/13/2013    TECHNIQUE: AP portable taken at 0 57.    FINDINGS: Tiny granuloma right upper lobe. No pneumonia, effusion, or  CHF. Normal heart size.      Impression:     No active disease in the chest       Lynnae Prude, MD   06/11/2013 8:01 AM            Signed by: Romelle Starcher, MD

## 2013-06-11 NOTE — Plan of Care (Signed)
Severe Sepsis Screen  Date: 06/11/2013 Time: 8:43 PM  Nurse Signature: Gardiner Coins    A. Infection:    Does your patient have ONE or more of the following infection criteria?     [x]  Documented Infection - Does the patient have positive culture results (from blood,        sputum, urine, etc)?   [x]  Anti-Infective Therapy - Is the patient receiving antibiotic, antifungal, or other                anti-infective therapy?   []  Pneumonia - Is there documentation of pneumonia (X Ray, etc)?   []  WBC's - Have WBC's been found in normally sterile fluid (urine, CSF, etc.)?   []  Perforated Viscus -Does the patient have a perforated hollow organ (bowel)?    A.  Did you check any of the boxes above?    []  No  If No, Stop Here and Sepsis Screen Negative.               [x]  Yes, continue to section B      B. SIRS:     Does your patient have TWO or more of the following SIRS criteria (ensure vital signs & temperature are within 1 hour of this screening)?    []  Temperature - Is the patient's temperature: Temp: 97.5 F (36.4 C) (06/11/13 1958)   - Greater than or equal to 38.3 degrees C (greater than 100.9 degrees F)?   - Less than or equal to 36 degrees C (less than or equal to 96.8 degrees F)?    []  Heart Rate: Heart Rate: 83  (06/11/13 1958)   - Is the patient's heart rate greater than or equal to 90 bpm?    [x]  Respiratory: Resp Rate: 20  (06/11/13 1958)   - Is the patient's respiratory rate greater than or equal to 20?    []  WBC Count - Is the patient's WBC count:   Lab 06/11/13 1902   WBC 10.69      - Greater than or equal to 12,000/mm3 OR   - Less than or equal to 4,000/mm3 OR    - Are there greater than 10% immature neutrophils (bands)?    [x]  Glucose >140 without diabetes?   Lab 06/11/13 1902   GLU 215*       []  Significant edema?    B.  Did you check two or more of the boxes above?     []  No, Stop Here and Sepsis Screen Negative   [x]  Yes, continue to section C    C.  Acute Organ Dysfunction     Does your patient have ONE  or more of the following organ dysfunction? (May need to wait for lab results for assessment - see below) Organ dysfunction must be a result of the sepsis not chronic conditions.    []  Cardiovascular - Does the patient have a: BP: 110/49 mmHg (06/11/13 1958)   -systolic Blood pressure less than or equal to 90 mmHg OR   -systolic blood pressure has dropped 40 mmHg or more from baseline OR   -mean arterial pressure less than or equal to 70 mmHg (for at least one hour   despite fluid resuscitation OR require vasopressor support?  []  Respiratory - Does the patient have new hypoxia defined by any of the following"   -A sustained increase in oxygen requirements by at least 2L/min on NC or 28%    FiO2 within the last 24 hrs OR   -  A persistent decrease in oxygen saturation of greater than or equal to 5% lasting   at least four or more hours and occurring within the last 24 hours  []  Renal - Does the patient have:   -low urine output (e.g. Less than 0.61mL/kg/HR for one hour despite adequate fluid    resuscitation, OR   -Increased creatinine (greater than 50% increase from baseline) OR   -require acute dialysis?  []  Hematologic - Does the patient have a:   -Low platelet count (less than 100,000 mm3)   Lab 06/11/13 1902   PLT 231   OR   -INR/aPTT greater than upper limit of normal?No results found for this basename: INR:1 in the last 168 hours or                No results found for this basename: APTT:1 in the last 168 hours  []  Metabolic - Does the patient have a high lactate (plasma lactate greater than or equal to 2.4 mMol/L)? No results found for this basename: LACTATE:5 in the last 168 hours    []  Hepatic - Are the patient's liver enzymes elevated (ALT greater than 72 IU/L or Total      Bilirubin greater than 2 MG/dL)?    Lab 06/11/13 1902   BILITOTAL 0.3   ALT 14     []  CNS - Does the patient have altered consciousness or reduced Glasgow Coma      Scale?    C.  Did you check any of the boxes above?     [x]  No, Sepsis  Screen Negative   []  YES:  A) Infection + B) SIRS + C) Organ Dysfunction = Positive Screen for Severe Sepsis     Notify MD and document in Complex Assessment under provider notification   - Name of physician notified:                                           - Date/Time Notifiied:                                             Document actions: Following must be completed within 1 hour of positive sepsis screen   []  Lactate drawn (if initial lactate > , repeat lactate in 2 hours for goal decrease 10-20%)   []  Blood Cultures obtained (prior to antibiotic administration; if not done within the last 48 hours)   []  Antibiotics initiated or modified    []   IV Fluid administered 0.9% NS __________ mLs given (Initial Bolus of 30 ml/kg if SBP < 90 or MAP < 65 or lactate greater than 4 mmol/dl)  Nursing Comments?:     _________________________________________________________________    Patients meeting the following criteria are excluded from screening (check if applicable):   []  [place] hypothermia protocol   []  Comfort Care orders   []  Surgery- No screening for 24 hours after surgery (48 hours after CV surgery)       -If Yes. Date of surgery:______________________   []  Admitted with sepsis and until 72 hours after admission with documented sepsis (RESUME SEPSIS SCREEN AFTER 72 hour window!!!)      -If Yes, Date of Documented Sepsis:______________________   []  Positive screen AND Completed sepsis bundle during previous 24 hours       -  If Yes, Date/Time of positive severe sepsis screen:_______________________

## 2013-06-11 NOTE — ED Notes (Signed)
BP down to 138/69, will hold on the Nitro for now pr Dr. Dory Peru.

## 2013-06-12 ENCOUNTER — Inpatient Hospital Stay: Payer: Medicare Other

## 2013-06-12 LAB — ECG 12-LEAD
Atrial Rate: 107 {beats}/min
P Axis: 71 degrees
P-R Interval: 126 ms
Q-T Interval: 328 ms
QRS Duration: 74 ms
QTC Calculation (Bezet): 437 ms
R Axis: 22 degrees
T Axis: 44 degrees
Ventricular Rate: 107 {beats}/min

## 2013-06-12 LAB — CBC AND DIFFERENTIAL
Basophils Absolute Automated: 0.01 (ref 0.00–0.20)
Basophils Automated: 0 %
Eosinophils Absolute Automated: 0 (ref 0.00–0.70)
Eosinophils Automated: 0 %
Hematocrit: 34.2 % — ABNORMAL LOW (ref 37.0–47.0)
Hgb: 11.6 g/dL — ABNORMAL LOW (ref 12.0–16.0)
Immature Granulocytes Absolute: 0.04
Immature Granulocytes: 0 %
Lymphocytes Absolute Automated: 0.63 (ref 0.50–4.40)
Lymphocytes Automated: 6 %
MCH: 29.4 pg (ref 28.0–32.0)
MCHC: 33.9 g/dL (ref 32.0–36.0)
MCV: 86.8 fL (ref 80.0–100.0)
MPV: 8.5 fL — ABNORMAL LOW (ref 9.4–12.3)
Monocytes Absolute Automated: 0.08 (ref 0.00–1.20)
Monocytes: 1 %
Neutrophils Absolute: 10.25 — ABNORMAL HIGH (ref 1.80–8.10)
Neutrophils: 94 %
Nucleated RBC: 0 (ref 0–1)
Platelets: 302 (ref 140–400)
RBC: 3.94 — ABNORMAL LOW (ref 4.20–5.40)
RDW: 13 % (ref 12–15)
WBC: 10.97 — ABNORMAL HIGH (ref 3.50–10.80)

## 2013-06-12 LAB — COMPREHENSIVE METABOLIC PANEL
ALT: 17 U/L (ref 0–55)
AST (SGOT): 19 U/L (ref 5–34)
Albumin/Globulin Ratio: 0.8 — ABNORMAL LOW (ref 0.9–2.2)
Albumin: 2.5 g/dL — ABNORMAL LOW (ref 3.5–5.0)
Alkaline Phosphatase: 61 U/L (ref 40–150)
Anion Gap: 13 (ref 5.0–15.0)
BUN: 12 mg/dL (ref 7.0–19.0)
Bilirubin, Total: 0.3 mg/dL (ref 0.2–1.2)
CO2: 24 (ref 22–29)
Calcium: 8.4 mg/dL (ref 7.9–10.6)
Chloride: 99 (ref 98–107)
Creatinine: 0.8 mg/dL (ref 0.6–1.0)
Globulin: 3.2 g/dL (ref 2.0–3.6)
Glucose: 166 mg/dL — ABNORMAL HIGH (ref 70–100)
Potassium: 3.8 (ref 3.5–5.1)
Protein, Total: 5.7 g/dL — ABNORMAL LOW (ref 6.0–8.3)
Sodium: 136 (ref 136–145)

## 2013-06-12 LAB — HEMOLYSIS INDEX: Hemolysis Index: -1 — ABNORMAL LOW (ref 0–18)

## 2013-06-12 LAB — GFR: EGFR: >60

## 2013-06-12 MED ORDER — METHYLPREDNISOLONE SODIUM SUCC 125 MG IJ SOLR
60.0000 mg | Freq: Two times a day (BID) | INTRAMUSCULAR | Status: DC
Start: 2013-06-13 — End: 2013-06-13
  Administered 2013-06-13: 60 mg via INTRAVENOUS
  Filled 2013-06-12: qty 2

## 2013-06-12 NOTE — Consults (Signed)
Service Date: 06/12/2013     Patient Type: I     CONSULTING PHYSICIAN: Jera Headings Lovie Macadamia MD     REFERRING PHYSICIAN:      CONSULTING SERVICE:  Psychiatry.     HISTORY OF PRESENT ILLNESS:  This is an 79 year old widowed female of [place] origin who was admitted to  the hospital for cough, shortness of breath, and hypotension.     PAST MEDICAL HISTORY:  Hypertensive cardiovascular disorder, bronchitis, syncopal episode, GERD,  failure to thrive, hypotension, arthritis, etc.  The patient also has  progressive vision loss and hearing loss.     CURRENT MEDICATIONS:  The patient is on aspirin, Coreg, Plavix, Lovenox, Advair, Lasix,  methylprednisone, MiraLax, Zofran, etc.  The patient is also on  nitroglycerin and Zocor.     PAST PSYCHIATRIC HISTORY:  None acute, however, patient tried on Zoloft and  subsequently Remeron and all medicines were totally stopped.       SOCIAL HISTORY:  The patient is widowed, lives with her family in this area.  No history of  smoking or alcohol or substance use noted.     MENTAL STATUS EXAMINATION:  Well-developed, petite, pleasant lady of [place] origin, alert, oriented to  person, place; appeared to be weak.  Her son is with her.  Her son is a  Therapist, sports in a different state.  Cognitive and memory functions seem to  be intact superficially.  Is not in distress, not suicidal, not homicidal.   Her insight and judgment appeared to be fair to impaired.     DIAGNOSTIC IMPRESSION:  AXIS I:  1.  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  2.  Major depression, chronic, recurrent, secondary to general medical  condition.  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  AXIS II:  Deferred, 799.90.  AXIS III:  Hypertensive cardiovascular disorder, lung disease, bronchitis,  arthritis, GERD, difficulty in ambulation, progressive vision loss, hearing  loss.  AXIS IV:  Moderate to severe.  AXIS V:  30 to 40.     PLAN AND RECOMMENDATION:  The patient may benefit from rehabilitation  services, supportive care.  No  clear indication for any medicines at this time.           D:  06/12/2013 17:27 PM by Dr. Laury Axon. Elpidio Anis, MD ([id number])  T:  06/12/2013 19:58 PM by       (Conf: [id number]) (Doc ID: [id number])

## 2013-06-12 NOTE — Plan of Care (Addendum)
Severe Sepsis Screen  Date: 06/12/2013 Time: 8:37 AM  Nurse Signature: Angelica Chessman    A. Infection:    Does your patient have ONE or more of the following infection criteria?     [x]  Documented Infection - Does the patient have positive culture results (from blood,        sputum, urine, etc)?   [x]  Anti-Infective Therapy - Is the patient receiving antibiotic, antifungal, or other                anti-infective therapy?   []  Pneumonia - Is there documentation of pneumonia (X Ray, etc)?   []  WBC's - Have WBC's been found in normally sterile fluid (urine, CSF, etc.)?   []  Perforated Viscus -Does the patient have a perforated hollow organ (bowel)?    A.  Did you check any of the boxes above?    []  No  If No, Stop Here and Sepsis Screen Negative.               [x]  Yes, continue to section B      B. SIRS:     Does your patient have TWO or more of the following SIRS criteria (ensure vital signs & temperature are within 1 hour of this screening)?    []  Temperature - Is the patient's temperature: Temp: 96.7 F (35.9 C) (06/12/13 0724)   - Greater than or equal to 38.3 degrees C (greater than 100.9 degrees F)?   - Less than or equal to 36 degrees C (less than or equal to 96.8 degrees F)?    [x]  Heart Rate: Heart Rate: 95  (06/12/13 0724)   - Is the patient's heart rate greater than or equal to 90 bpm?    []  Respiratory: Resp Rate: 18  (06/12/13 0724)   - Is the patient's respiratory rate greater than or equal to 20?    []  WBC Count - Is the patient's WBC count:   Lab 06/12/13 0352   WBC 10.97*      - Greater than or equal to 12,000/mm3 OR   - Less than or equal to 4,000/mm3 OR    - Are there greater than 10% immature neutrophils (bands)?    [x]  Glucose >140 without diabetes?   Lab 06/12/13 0352   GLU 166*       []  Significant edema?    B.  Did you check two or more of the boxes above?     []  No, Stop Here and Sepsis Screen Negative   [x]  Yes, continue to section C    C.  Acute Organ Dysfunction     Does your patient have ONE or  more of the following organ dysfunction? (May need to wait for lab results for assessment - see below) Organ dysfunction must be a result of the sepsis not chronic conditions.    []  Cardiovascular - Does the patient have a: BP: 148/69 mmHg (06/12/13 0724)   -systolic Blood pressure less than or equal to 90 mmHg OR   -systolic blood pressure has dropped 40 mmHg or more from baseline OR   -mean arterial pressure less than or equal to 70 mmHg (for at least one hour   despite fluid resuscitation OR require vasopressor support?  []  Respiratory - Does the patient have new hypoxia defined by any of the following"   -A sustained increase in oxygen requirements by at least 2L/min on NC or 28%    FiO2 within the last 24 hrs OR   -  A persistent decrease in oxygen saturation of greater than or equal to 5% lasting   at least four or more hours and occurring within the last 24 hours  []  Renal - Does the patient have:   -low urine output (e.g. Less than 0.10mL/kg/HR for one hour despite adequate fluid    resuscitation, OR   -Increased creatinine (greater than 50% increase from baseline) OR   -require acute dialysis?  []  Hematologic - Does the patient have a:   -Low platelet count (less than 100,000 mm3)   Lab 06/12/13 0352   PLT 302   OR   -INR/aPTT greater than upper limit of normal?No results found for this basename: INR:1 in the last 168 hours or                No results found for this basename: APTT:1 in the last 168 hours  []  Metabolic - Does the patient have a high lactate (plasma lactate greater than or equal to 2.4 mMol/L)? No results found for this basename: LACTATE:5 in the last 168 hours    []  Hepatic - Are the patient's liver enzymes elevated (ALT greater than 72 IU/L or Total      Bilirubin greater than 2 MG/dL)?    Lab 06/12/13 0352   BILITOTAL 0.3   ALT 17     []  CNS - Does the patient have altered consciousness or reduced Glasgow Coma      Scale?    C.  Did you check any of the boxes above?     [x]  No, Sepsis Screen  Negative   []  YES:  A) Infection + B) SIRS + C) Organ Dysfunction = Positive Screen for Severe Sepsis     Notify MD and document in Complex Assessment under provider notification   - Name of physician notified:                                           - Date/Time Notifiied:                                             Document actions: Following must be completed within 1 hour of positive sepsis screen   []  Lactate drawn (if initial lactate > , repeat lactate in 2 hours for goal decrease 10-20%)   []  Blood Cultures obtained (prior to antibiotic administration; if not done within the last 48 hours)   []  Antibiotics initiated or modified    []   IV Fluid administered 0.9% NS __________ mLs given (Initial Bolus of 30 ml/kg if SBP < 90 or MAP < 65 or lactate greater than 4 mmol/dl)  Nursing Comments?:     _________________________________________________________________    Patients meeting the following criteria are excluded from screening (check if applicable):   []  [place] hypothermia protocol   []  Comfort Care orders   []  Surgery- No screening for 24 hours after surgery (48 hours after CV surgery)       -If Yes. Date of surgery:______________________   []  Admitted with sepsis and until 72 hours after admission with documented sepsis (RESUME SEPSIS SCREEN AFTER 72 hour window!!!)      -If Yes, Date of Documented Sepsis:______________________   []  Positive screen AND Completed sepsis bundle during previous 24 hours       -  If Yes, Date/Time of positive severe sepsis screen:_______________________

## 2013-06-12 NOTE — Progress Notes (Signed)
PROGRESS NOTE                                                 Gean Quint MD, [place], CMD                                                             [phone]    Date Time: 06/12/2013 10:35 PM  Patient Name: Jane [name], Jane [name]  Patient status: Inpatient  Hospital Day: 2           Assessment:   Status asthmaticus  Asthmatic bronchitis  Respiratory insufficiency  Hx of chf  CAD  HLD      Plan:   Antibiotics  Iv steroids  Nebs  o2  Cont asa  Beta blocker  nitrate  DVT prophylaxis  GI  Prophylaxis  Reason for contd. Hospitalization =resp insufficiency, bronchitis, status asthmaticus, CAD  Subjective:   Still has cough, wheezes and sob    Medications:     Current Facility-Administered Medications   Medication Dose Route Frequency   . aspirin  81 mg Oral Daily   . azithromycin  250 mg Oral Daily   . carvedilol  12.5 mg Oral Q12H SCH   . cefTRIAXone  1 g Intravenous Q24H   . clopidogrel  75 mg Oral Daily   . enoxaparin  40 mg Subcutaneous Daily   . fluticasone-salmeterol  1 puff Inhalation BID   . furosemide  20 mg Oral BID   . guaiFENesin  1,200 mg Oral BID   . ipratropium-albuterol  3 mL Nebulization Q6H SCH   . methylprednisolone  80 mg Intravenous Q8H   . nitroglycerin  1 patch Transdermal Q24H   . simvastatin  20 mg Oral QHS       Review of Systems:    General ROS: negative, no weight loss/gain, no fever, no chills, no rigor   ENT ROS: negative   Endocrine ROS: negative, no fatigue, no polydipsia   Respiratory ROS:  cough, shortness of breath, and wheezing   Cardiovascular ROS: no chest pain or dyspnea on exertion   Gastrointestinal ROS: no abdominal pain, change in bowel habits, or black or bloody stools   Genito-Urinary ROS: no dysuria, trouble voiding, or hematuria   Musculoskeletal ROS: negative   Neurological ROS: no TIA or stroke symptoms, no encephalopathy   Dermatological ROS: negative, no rash, no ulcer    Physical Exam:     Filed Vitals:    06/12/13 1931   BP: 137/63   Pulse: 95   Temp: 97.2 F  (36.2 C)   Resp: 15   SpO2: 94%         Intake/Output Summary (Last 24 hours) at 06/12/13 2235  Last data filed at 06/12/13 1756   Gross per 24 hour   Intake    840 ml   Output      3 ml   Net    837 ml       General appearance - alert, well appearing, and in no distress  Mental status - alert, oriented to person, place, and time  Eyes - pupils equal and reactive, extraocular eye movements intact  Nose -  normal and patent, no erythema, discharge or polyps  Mouth - mucous membranes moist, pharynx normal without lesions  Neck - Supple, no JVD, no thyromegaly  Lymphatics - no  lymphadenopathy  Chest - wheezes,  symmetric air entry  Heart - normal rate, regular rhythm, normal S1, S2, no murmurs, rubs, clicks or gallops  Abdomen - soft, nontender, nondistended, no masses or organomegaly, bowel sounds normal  Neurological - alert, oriented, normal speech, no focal findings or movement disorder noted  Extremities - peripheral pulses normal, no pedal edema, no clubbing or cyanosis  Skin - normal coloration and turgor, no rashes, no suspicious skin lesions noted    Labs:     CBC w/Diff CMP     Lab 06/12/13 0352 06/11/13 1902 06/10/13 2319   WBC 10.97* 10.69 13.23*   HGB 11.6* 10.9* 12.0   HCT 34.2* 31.5* 34.4*   PLT 302 231 324   MCV 86.8 86.8 88.2   NEUTRO 94 80 80       PT/INR   No results found for this basename: INR:3 in the last 168 hours      Lab 06/12/13 0352 06/11/13 1902 06/10/13 2357   NA 136 135* 134*   K 3.8 3.2* 3.8   CL 99 99 99   CO2 24 28 24    BUN 12.0 9.0 10.0   CREAT 0.8 0.8 0.7   GLU 166* 215* 136*   CA 8.4 8.0 8.8   MG -- -- --   PHOS -- -- --   PROT 5.7* 5.0* 6.5   ALB 2.5* 2.3* 3.0*   AST 19 16 25    ALT 17 14 23    ALKPHOS 61 50 58   BILITOTAL 0.3 0.3 0.4      Glucose POCT     Lab 06/12/13 0352 06/11/13 1902 06/10/13 2357   GLU 166* 215* 136*          Lab 06/11/13 1902 06/11/13 1338 06/11/13 0531   CK -- -- --   TROPI 0.04 0.02 <0.01   TROPT -- -- --   CKMBINDEX -- -- --         ABGs:    No results  found for this basename: ABGCOLLECTIO, ALLENSTEST, PHART, PCO2ART, PO2ART, HCO3ART, BEART, O2SATART       Urinalysis  No results found for this basename: URINETYPE,COLORUA,CLARITYUA,SPECGRAVUA,URINEPH,LEUKOCYTESUR,NITRITEUA,PROTEINUA,GLUUA,KETONESUA,UROBILIUA,BILIUA,BLOODUA,RBCUA,WBCUA,URINEBACTERI,GRANCASTUA in the last 168 hours      Rads:     Radiology Results (24 Hour)     Procedure Component Value Units Date/Time    XR Chest AP Portable [[id number]] Collected:06/12/13 0955    Order Status:Completed  Updated:06/12/13 1000    Narrative:    Clinical history: Cough.    TECHNIQUE: Plain film assessment of the chest was performed with AP  portable view    FINDINGS:  There is a small calcified granuloma in the upper right hemithorax. The  lung parenchyma is otherwise clear. Skeletal arthritis is noted and  shoulder arthritis and thoracic arthritis is detected. EKG leads are  present. The subdiaphragmatic region is clear.      Impression:      Skeletal arthritis is noted.  The lungs are clear of acute process  The chest is unchanged from the recent examination performed on  06/11/2013.    Hal Hope, MD   06/12/2013 9:56 AM          Gean Quint, MD  06/12/2013  10:35 PM

## 2013-06-12 NOTE — Consults (Signed)
Psych Consult Dictated:

## 2013-06-12 NOTE — Plan of Care (Addendum)
Dr has been paged 10 times and not responded back, also tried calling NP and phone just rings.  Will continue to page Dr for pt and pt's family.      Family requested and given print out of lab results.        Family wondering why doctor has not been in to see pt.  Family anxious to have her d/c'd home.  Also pt has eye drops that were not ordered on admission and would like to get those for the pt.  Dr paged for eyedrops and rounding schedule.  Awaiting response.        Family requested and given print out of chest xray results.      Pt helped up to bathroom, to wash face and to brush teeth.  Pt is pretty stable on feet needs standby assist.        Family concerned about seeing doctor.  Doctor notified and alerted that family is waiting to meet with her.        PINC- ISHAPED done at bedside.  Plan of care discussed with pt: antibiotics, meds, fall precautions, nutrition.  Pt and family verbalized understanding.  White board updated.  Will continue to monitor and update to any changes.      Problem: Moderate/High Fall Risk Score >/=15  Goal: Patient will remain free of falls  Outcome: Progressing  Pt is a high fall risk.  Pt is unsteady on feet and needs assist to get up out of bed.  Pt is generally weak.  Pt is on lovenox.  Pt is 79 y.o. Pt is on lasix and can have urinary urgency.  Will offer toileting opportunities with hourly rounding.  Will assist pt when needed.  Fall prevention safety plan updated and pt educated on fall prevention and safety.  Pt and family verbalized understanding.

## 2013-06-12 NOTE — Plan of Care (Signed)
PINC Note: Received pt awake, alert, & oriented x 4. Family at bedside. Bedside report given by daytime RN. No signs of acute distress. Pt denies pain or discomfort at this time. Plan of Care: IV antibiotics, BP/cardiac meds, taper IV steroids, scheduled nebs, AM labs, possible home respiratory consult tomorrow. Pt & family at bedside verbalized understanding of POC & have no questions at the time.        Severe Sepsis Screen  Date: 06/12/2013 Time: 8:16 PM  Nurse Signature: Gardiner Coins    A. Infection:    Does your patient have ONE or more of the following infection criteria?     [x]  Documented Infection - Does the patient have positive culture results (from blood,        sputum, urine, etc)?   [x]  Anti-Infective Therapy - Is the patient receiving antibiotic, antifungal, or other                anti-infective therapy?   []  Pneumonia - Is there documentation of pneumonia (X Ray, etc)?   []  WBC's - Have WBC's been found in normally sterile fluid (urine, CSF, etc.)?   []  Perforated Viscus -Does the patient have a perforated hollow organ (bowel)?    A.  Did you check any of the boxes above?    []  No  If No, Stop Here and Sepsis Screen Negative.               [x]  Yes, continue to section B      B. SIRS:     Does your patient have TWO or more of the following SIRS criteria (ensure vital signs & temperature are within 1 hour of this screening)?    []  Temperature - Is the patient's temperature: Temp: 97.2 F (36.2 C) (06/12/13 1931)   - Greater than or equal to 38.3 degrees C (greater than 100.9 degrees F)?   - Less than or equal to 36 degrees C (less than or equal to 96.8 degrees F)?    [x]  Heart Rate: Heart Rate: 95  (06/12/13 1931)   - Is the patient's heart rate greater than or equal to 90 bpm?    []  Respiratory: Resp Rate: 15  (06/12/13 1931)   - Is the patient's respiratory rate greater than or equal to 20?    []  WBC Count - Is the patient's WBC count:   Lab 06/12/13 0352   WBC 10.97*      - Greater than or equal to  12,000/mm3 OR   - Less than or equal to 4,000/mm3 OR    - Are there greater than 10% immature neutrophils (bands)?    [x]  Glucose >140 without diabetes?   Lab 06/12/13 0352   GLU 166*       []  Significant edema?    B.  Did you check two or more of the boxes above?     []  No, Stop Here and Sepsis Screen Negative   [x]  Yes, continue to section C    C.  Acute Organ Dysfunction     Does your patient have ONE or more of the following organ dysfunction? (May need to wait for lab results for assessment - see below) Organ dysfunction must be a result of the sepsis not chronic conditions.    []  Cardiovascular - Does the patient have a: BP: 137/63 mmHg (06/12/13 1931)   -systolic Blood pressure less than or equal to 90 mmHg OR   -systolic blood pressure has dropped 40 mmHg  or more from baseline OR   -mean arterial pressure less than or equal to 70 mmHg (for at least one hour   despite fluid resuscitation OR require vasopressor support?  []  Respiratory - Does the patient have new hypoxia defined by any of the following"   -A sustained increase in oxygen requirements by at least 2L/min on NC or 28%    FiO2 within the last 24 hrs OR   -A persistent decrease in oxygen saturation of greater than or equal to 5% lasting   at least four or more hours and occurring within the last 24 hours  []  Renal - Does the patient have:   -low urine output (e.g. Less than 0.43mL/kg/HR for one hour despite adequate fluid    resuscitation, OR   -Increased creatinine (greater than 50% increase from baseline) OR   -require acute dialysis?  []  Hematologic - Does the patient have a:   -Low platelet count (less than 100,000 mm3)   Lab 06/12/13 0352   PLT 302   OR   -INR/aPTT greater than upper limit of normal?No results found for this basename: INR:1 in the last 168 hours or                No results found for this basename: APTT:1 in the last 168 hours  []  Metabolic - Does the patient have a high lactate (plasma lactate greater than or equal to 2.4  mMol/L)? No results found for this basename: LACTATE:5 in the last 168 hours    []  Hepatic - Are the patient's liver enzymes elevated (ALT greater than 72 IU/L or Total      Bilirubin greater than 2 MG/dL)?    Lab 06/12/13 0352   BILITOTAL 0.3   ALT 17     []  CNS - Does the patient have altered consciousness or reduced Glasgow Coma      Scale?    C.  Did you check any of the boxes above?     [x]  No, Sepsis Screen Negative   []  YES:  A) Infection + B) SIRS + C) Organ Dysfunction = Positive Screen for Severe Sepsis     Notify MD and document in Complex Assessment under provider notification   - Name of physician notified:                                           - Date/Time Notifiied:                                             Document actions: Following must be completed within 1 hour of positive sepsis screen   []  Lactate drawn (if initial lactate > , repeat lactate in 2 hours for goal decrease 10-20%)   []  Blood Cultures obtained (prior to antibiotic administration; if not done within the last 48 hours)   []  Antibiotics initiated or modified    []   IV Fluid administered 0.9% NS __________ mLs given (Initial Bolus of 30 ml/kg if SBP < 90 or MAP < 65 or lactate greater than 4 mmol/dl)  Nursing Comments?:     _________________________________________________________________    Patients meeting the following criteria are excluded from screening (check if applicable):   []  [place] hypothermia protocol   []  Comfort  Care orders   []  Surgery- No screening for 24 hours after surgery (48 hours after CV surgery)       -If Yes. Date of surgery:______________________   []  Admitted with sepsis and until 72 hours after admission with documented sepsis (RESUME SEPSIS SCREEN AFTER 72 hour window!!!)      -If Yes, Date of Documented Sepsis:______________________   []  Positive screen AND Completed sepsis bundle during previous 24 hours       -If Yes, Date/Time of positive severe sepsis  screen:_______________________

## 2013-06-12 NOTE — Patient Care Conference (Signed)
PINC Note:    Received pt drowsy but oriented x 4. Bedside report given by outgoing RN. Pt is SR on tele. No signs of acute distress. Pt denies pain or discomfort at this time.    Plan of Care: IV antibiotics, scheduled nebs, IV steroids, AM labs, CXR tomorrow.    Pt & family at bedside verbalized understanding of POC. Pt's family had multiple questions regarding medications pt was on in hospital. Family provided w/ list of hospital medications.

## 2013-06-12 NOTE — Progress Notes (Addendum)
Met and introduced self to pt and son at bedside. Verified contact information. Pt lives at home with her daughter and son. Pt uses a walker at home. Pt currently receives HHPT/OT. Cannot recall name of agency.     Pt's son states that she just moved to [place] in the last month. Pt has Medicaid [place]. Son inquiring about applying for [facility] [place]. Explained that pt would have to apply for Medicaid [place] through the Department of Social Services through the city that she is residing. Explained that she will have to discontinue her Medicaid [place]. Son asking if [place] will cover some of her bills retroactively. Explained that she will most likely need proof of residency at the time of previous hospitalizations in [place].     Per son, pt may go home with nebulizers (not just inhalers). Explained that pt will need a prescription for these and that I will notify the Respiratory Care Coordinator.    Informed pt and son that I will ask Maryla Morrow, Financial Aid Coordinator for clarification of questions.    Called and left message with Maryla Morrow [phone].     Called and left message with [facility] [phone] for potential resumption of HH services.     Called and left message with Bonita Quin, [place] Coordinator [phone] re: home nebulizers.

## 2013-06-12 NOTE — Plan of Care (Signed)
Problem: Safety  Goal: Patient will be free from injury during hospitalization  Outcome: Progressing    -Pt has been free of falls during shift.  Intervention: Assess patient's risk for falls and implement fall prevention plan of care per policy    -Pt is high fall risk due to age over 83, unsteady gait, generalized weakness, & meds affecting BP. Yellow armband in place. Pt & family at bedside instructed to always use call bell/phone to contact staff prior to OOB to prevent falls. Verbalized understanding.  Intervention: Include patient/family/caregiver in decisions related to safety    -Family at bedside verbalized understanding of pt's high fall risk & need for staff assistance when ambulating to bathroom.      Problem: Urinary Incontinence  Goal: Perineal skin integrity is maintained or improved  Assess genitourinary system, perineal skin, labs (urinalysis), and history of incontinence to include past management, aggravating, and alleviating factors. Collaborate with interdisciplinary team and initiate plans and interventions as needed.   Outcome: Progressing    -Pt is continent of urine & ambulates to bathroom w/ 1-person assist.  Intervention: Keep skin clean and dry    -Pt has trouble wiping after urine occurrence due to generalized weakness. Staff has used incontinence wipes to keep pt's perineal skin clean & dry.

## 2013-06-13 ENCOUNTER — Inpatient Hospital Stay: Payer: Medicare Other

## 2013-06-13 LAB — CBC AND DIFFERENTIAL
Hematocrit: 36.9 % — ABNORMAL LOW (ref 37.0–47.0)
Hgb: 12.5 g/dL (ref 12.0–16.0)
MCH: 29.3 pg (ref 28.0–32.0)
MCHC: 33.9 g/dL (ref 32.0–36.0)
MCV: 86.4 fL (ref 80.0–100.0)
MPV: 8.5 fL — ABNORMAL LOW (ref 9.4–12.3)
Platelets: 317 (ref 140–400)
RBC: 4.27 (ref 4.20–5.40)
RDW: 13 % (ref 12–15)
WBC: 17.32 — ABNORMAL HIGH (ref 3.50–10.80)

## 2013-06-13 LAB — COMPREHENSIVE METABOLIC PANEL
ALT: 33 U/L (ref 0–55)
AST (SGOT): 32 U/L (ref 5–34)
Albumin/Globulin Ratio: 0.7 — ABNORMAL LOW (ref 0.9–2.2)
Albumin: 2.6 g/dL — ABNORMAL LOW (ref 3.5–5.0)
Alkaline Phosphatase: 62 U/L (ref 40–150)
Anion Gap: 10 (ref 5.0–15.0)
BUN: 20 mg/dL — ABNORMAL HIGH (ref 7.0–19.0)
Bilirubin, Total: 0.2 mg/dL (ref 0.2–1.2)
CO2: 26 (ref 22–29)
Calcium: 8.7 mg/dL (ref 7.9–10.6)
Chloride: 99 (ref 98–107)
Creatinine: 0.9 mg/dL (ref 0.6–1.0)
Globulin: 3.6 g/dL (ref 2.0–3.6)
Glucose: 172 mg/dL — ABNORMAL HIGH (ref 70–100)
Potassium: 4.2 (ref 3.5–5.1)
Protein, Total: 6.2 g/dL (ref 6.0–8.3)
Sodium: 135 — ABNORMAL LOW (ref 136–145)

## 2013-06-13 LAB — HEMOLYSIS INDEX: Hemolysis Index: 0 (ref 0–18)

## 2013-06-13 LAB — MAN DIFF ONLY
Band Neutrophils Absolute: 0.17 (ref 0.00–1.00)
Band Neutrophils: 1 %
Basophils Absolute Manual: 0 (ref 0.00–0.20)
Basophils Manual: 0 %
Eosinophils Absolute Manual: 0 (ref 0.00–0.70)
Eosinophils Manual: 0 %
Lymphocytes Absolute Manual: 0.69 (ref 0.50–4.40)
Lymphocytes Manual: 4 %
Monocytes Absolute: 0.87 (ref 0.00–1.20)
Monocytes Manual: 5 %
Neutrophils Absolute Manual: 15.59 — ABNORMAL HIGH (ref 1.80–8.10)
Nucleated RBC: 0 (ref 0–1)
Segmented Neutrophils: 90 %

## 2013-06-13 LAB — CELL MORPHOLOGY: Cell Morphology: NORMAL

## 2013-06-13 LAB — GFR: EGFR: 59.2

## 2013-06-13 MED ORDER — ALBUTEROL SULFATE (2.5 MG/3ML) 0.083% IN NEBU
2.5000 mg | INHALATION_SOLUTION | Freq: Four times a day (QID) | RESPIRATORY_TRACT | Status: DC | PRN
Start: 2013-06-13 — End: 2013-10-06

## 2013-06-13 MED ORDER — BENZONATATE 100 MG PO CAPS
100.0000 mg | ORAL_CAPSULE | Freq: Three times a day (TID) | ORAL | Status: AC | PRN
Start: 2013-06-13 — End: ?

## 2013-06-13 MED ORDER — PREDNISONE 5 MG PO TABS
60.0000 mg | ORAL_TABLET | Freq: Every day | ORAL | Status: AC
Start: 2013-06-13 — End: ?

## 2013-06-13 MED ORDER — LEVOFLOXACIN 250 MG PO TABS
250.0000 mg | ORAL_TABLET | Freq: Every day | ORAL | Status: AC
Start: 2013-06-13 — End: 2013-06-18

## 2013-06-13 NOTE — Progress Notes (Signed)
Home Health Referral          Referral from Debara Pickett RN (Case Manager) for home health care upon discharge.      Home Health Discharge Information     Your doctor has ordered Skilled Nursing, Physical Therapy and Occupational Therapy in-home service(s) for you while you recuperate at home, to assist you in the transition from hospital to home.      The agency that you or your representative chose to provide the service:  Name: [facility] Phone #:   [phone]              The above services were set up by:                  Desma Maxim RN/ Rudene Anda RN         [facility] Liaison)        Signed by: Emiliano Dyer  Date Time: 06/13/2013 1:59 PM

## 2013-06-13 NOTE — Discharge Summary (Signed)
DISCHARGE SUMMARY    Date Time: 06/13/2013 1:39 PM  Patient Name: Jane [name]  Attending Physician: Gean Quint, MD    Date of Admission:   06/10/2013    Date of Discharge:   06/13/2013    Reason for Admission:   Bronchitis [490]  Essential (primary) hypertension [401.9]  Bronchitis    Discharge Diagnosis   Status asthmaticus, improved  Acute Asthmatic bronchitis, improved  ACUTE Respiratory insufficiency, improved  Hx of chf   CAD   HLD  Leukocytosis, likely related to steroids  Elevated BUN without rise in creatinine, likely related to steroids  Discharge Medications:     Current Discharge Medication List      START taking these medications    Details   albuterol (PROVENTIL) (2.5 MG/3ML) 0.083% nebulizer solution Take 3 mLs (2.5 mg total) by nebulization every 6 (six) hours as needed for Wheezing or Shortness of Breath.  Qty: 75 mL, Refills: 0      benzonatate (TESSALON) 100 MG capsule Take 1 capsule (100 mg total) by mouth 3 (three) times daily as needed for Cough.  Qty: 20 capsule, Refills: 0      levofloxacin (LEVAQUIN) 250 MG tablet Take 1 tablet (250 mg total) by mouth daily. For 5 days  Qty: 5 tablet, Refills: 0      predniSONE (DELTASONE) 5 MG tablet Take 12 tablets (60 mg total) by mouth daily. For 1 day,then decrease by 5 mg daily until finished  Qty: 78 tablet, Refills: 0         CONTINUE these medications which have NOT CHANGED    Details   aspirin EC 81 MG EC tablet Take 1 tablet (81 mg total) by mouth daily.  Qty: 30 tablet, Refills: 0      calcium-vitamin D (OSCAL-500) 500-200 MG-UNIT per tablet Take 1 tablet by mouth daily.      carvedilol (COREG) 12.5 MG tablet Take 1 tablet (12.5 mg total) by mouth 2 (two) times daily with meals.  Qty: 60 tablet, Refills: 0      clopidogrel (PLAVIX) 75 mg tablet Take 1 tablet (75 mg total) by mouth daily.  Qty: 30 tablet, Refills: 0      fluticasone-salmeterol (ADVAIR DISKUS) 100-50 MCG/DOSE AEPB Inhale 1 puff into the lungs.      furosemide (LASIX)  20 MG tablet Take 20 mg by mouth 2 (two) times daily.      guaiFENesin (MUCINEX) 600 MG 12 hr tablet Take 1,200 mg by mouth 2 (two) times daily.      Multiple Vitamins-Minerals (CENTRUM SILVER PO) Take by mouth.      nitroglycerin (NITRODUR) 0.2 MG/HR Place 1 patch onto the skin daily.  Qty: 30 patch, Refills: 0      olmesartan (BENICAR) 20 MG tablet Take 20 mg by mouth daily.      simvastatin (ZOCOR) 20 MG tablet Take 20 mg by mouth nightly.      sodium hypochlorite (DAKIN'S HALF-STRENGTH) external solution Apply topically once.      sertraline (ZOLOFT) 50 MG tablet Take 50 mg by mouth daily.             Consultations:   Dr. Elpidio Anis, psych      Physical Exam:     Filed Vitals:    06/13/13 1058   BP: 128/63   Pulse: 95   Temp: 96.5 F (35.8 C)   Resp: 18   SpO2: 95%     Review of Systems:    General ROS: negative, no  weight loss/gain, no fever, no chills, no rigor   ENT ROS: negative   Endocrine ROS: negative, no fatigue, no polydipsia   Respiratory ROS: cough, shortness of breath, and wheezing better  Cardiovascular ROS: no chest pain or dyspnea on exertion   Gastrointestinal ROS: no abdominal pain, change in bowel habits, or black or bloody stools   Genito-Urinary ROS: no dysuria, trouble voiding, or hematuria   Musculoskeletal ROS: negative   Neurological ROS: no TIA or stroke symptoms, no encephalopathy   Dermatological ROS: negative, no rash, no ulcer    General appearance - alert, well appearing, and in no distress, mostly hindi speaking, thin  Mental status - alert, oriented to person, place, and time   Eyes - pupils equal and reactive, extraocular eye movements intact   Nose - normal and patent,  Mouth - mucous membranes moist,  Neck - Supple, no JVD, no thyromegaly   Lymphatics - no lymphadenopathy   Chest - scattered rhonchi bases, no wheezing  Heart - normal rate, regular rhythm, normal S1, S2, no murmurs, rubs, clicks or gallops   Abdomen - soft, nontender, nondistended, no masses or organomegaly, bowel  sounds normal   Neurological - alert, oriented, normal speech, no focal findings or movement disorder noted   Extremities - peripheral pulses normal, no pedal edema, no clubbing or cyanosis   Skin - normal coloration and turgor, no rashes, no suspicious skin lesions noted         Labs:     Lab 06/13/13 0423 06/12/13 0352 06/11/13 1902   GLU 172* 166* 215*   BUN 20.0* 12.0 9.0   CREAT 0.9 0.8 0.8   CA 8.7 8.4 8.0   NA 135* 136 135*   K 4.2 3.8 3.2*   CL 99 99 99   CO2 26 24 28    ALB 2.6* 2.5* 2.3*   PHOS -- -- --   MG -- -- --   AST 32 19 16   ALT 33 17 14   BILITOTAL 0.2 0.3 0.3   ALKPHOS 62 61 50       Lab 06/13/13 0423 06/12/13 0352 06/11/13 1902   WBC 17.32* 10.97* 10.69   HGB 12.5 11.6* 10.9*   HCT 36.9* 34.2* 31.5*   MCV 86.4 86.8 86.8   MCH 29.3 29.4 30.0   MCHC 33.9 33.9 34.6   PLT 317 302 231     No results found for this basename: INR:3 in the last 168 hours      ABGs:    No results found for this basename: ABGCOLLECTIO, ALLENSTEST, PHART, PCO2ART, PO2ART, HCO3ART, BEART, O2SATART       Urinalysis  No results found for this basename: URINETYPE,COLORUA,CLARITYUA,SPECGRAVUA,URINEPH,LEUKOCYTESUR,NITRITEUA,PROTEINUA,GLUUA,KETONESUA,UROBILIUA,BILIUA,BLOODUA,RBCUA,WBCUA,URINEBACTERI,GRANCASTUA in the last 168 hours    RADIOLOGY :  Echocardiogram Adult Complete W Clr/ Dopp Waveform    05/18/2013  M-MODE 2D MEASUREMENTS LEFT ATRIUM:  2.5. AORTIC ROOT:  2.9. CUSP SEPARATION:  1.3. RV DIMENSION:  2.0. LV SYSTOLE:  2.5. LV DIASTOLE:  3.9. IVSD:    . POSTERIOR WALL:  1.0. EJECTION FRACTION:  55% to 60%.  DOPPLER MEASUREMENTS: PEAK VELOCITIES: MITRAL:  0.6 m/s.  AORTIC:  1.0 m/s. PULMONIC:  1.7 m/s.  TRICUSPID:  0.6 m/s. LVOT:  0.9 m/s.  DESCRIPTION OF PROCEDURE: Left ventricular chamber size is normal.  The LV systolic function normal.  Ejection fraction 55% to 60%.  Left atrial size normal.  RA and RV size normal.  Mitral valve shows trace regurgitation.  There is no evidence  of stenosis.  Aortic valve shows sclerosis  without evidence of stenosis.  Pulmonic valve is not well visualized.  Tricuspid valve shows trace regurgitation.  There is no pericardial effusion.  CONCLUSION: 1.  Normal cardiac chamber size. 2.  Normal left ventricular systolic function. 3.  Ejection fraction 55% to 60%. 4.  No pericardial effusion. 5.  Mild tricuspid regurgitation.     Nm Myocardial Perfusion Spect (stress And Rest)    05/15/2013  LEXISCAN SPECT MYOVIEW SAME DAY PROTOCOL  LEXISCAN MYOVIEW PERFUSION STUDY:  INDICATION: Chest pain  PROCEDURE: The patient was injected with 8 mCi of Myoview and SPECT imaging was begun  at least 30 minutes post injection. The camera obtained images with a 180-degree orbit around the patient's heart. After the rest images, the patient was then prepped for a regadenoson pharmacologic stress test. The patient was given 5 ml (0.4mg ) of IV regadenoson (Lexiscan) over a ten second rapid infusion with a five to ten ml normal saline flush given immediately after the infusion. The patient was then given an intravenous injection of 26.8 mCi of Myoview followed by another five to ten ml saline flush. The patient was monitored for approximately 5 minutes or until the patients EKG, BP, and symptoms came back to baseline. . Gated SPECT imaging was started at least 30 minutes post injection. The patient was then placed in the supine position and the images were obtained with a 180-degree orbit around the patient's heart while gating, when feasible, to the patient's ECG. The study was then processed and displayed for evaluation.  .  INTERPRETATION: Images were reviewed in the horizontal long axis, vertical long axis, and short axis. Review of [facility] quantification data and polar plots was also performed during this review.  The overall quality of the study is good. The left ventricular cavity is noted to be normal on the rest and/or stress images. SPECT images demonstrate a fairly extensive partially reversible anterior,  anteroseptal, septal and inferoseptal perfusion defect of moderate severity. The rest images reveal no additional abnormality. Gated SPECT imaging reveals mild anteroseptal hypokinesis. The calculated left ventricular ejection fraction is 73%.      05/15/2013   Partially reversible anterior, anteroseptal, septal and inferoseptal perfusion defect compatible with a focal infarct with moderate surrounding ischemia  Laurena Slimmer, MD  05/15/2013 2:48 PM     Xr Chest Ap Portable    06/13/2013  CLINICAL INDICATION: Dyspnea   COMPARISON: None available  INTERPRETATION:   A single frontal view of the chest was obtained. .   The cardiomediastinal contour  is within normal limits for age. The lungs are clear and the sulci are sharp.         06/13/2013    No acute cardiopulmonary process.  Wyatt Portela, MD  06/13/2013 9:47 AM     Xr Chest Ap Portable    06/12/2013  Clinical history: Cough.  TECHNIQUE: Plain film assessment of the chest was performed with AP portable view  FINDINGS: There is a small calcified granuloma in the upper right hemithorax. The lung parenchyma is otherwise clear. Skeletal arthritis is noted and shoulder arthritis and thoracic arthritis is detected. EKG leads are present. The subdiaphragmatic region is clear.      06/12/2013   Skeletal arthritis is noted. The lungs are clear of acute process The chest is unchanged from the recent examination performed on 06/11/2013.  Hal Hope, MD  06/12/2013 9:56 AM     Chest Ap Portable  06/11/2013  HISTORY: Cough   COMPARISON: 05/13/2013  TECHNIQUE: AP portable taken at 0 57.  FINDINGS: Tiny granuloma right upper lobe. No pneumonia, effusion, or CHF. Normal heart size.      06/11/2013   No active disease in the chest    Lynnae Prude, MD  06/11/2013 8:01 AM     Cv Cardiac Stress Test Tracing Only    05/15/2013  DESCRIPTION OF PROCEDURE: The patient underwent Lexiscan stress test.  She received Lexiscan 0.4 mg IV, flushed by normal saline.  The patient did not experience chest pain.   There were no significant EKG changes from the baseline.  IMPRESSION: Negative Lexiscan stress.     Left Heart Cath Poss Pci    05/17/2013  PREOPERATIVE DIAGNOSIS:  POSTOPERATIVE DIAGNOSIS:  TITLE OF PROCEDURES: Right and left heart catheterization, left ventriculography, and coronary cineangiography.  INDICATIONS FOR PROCEDURE: The patient is an 79 year old lady with chest pain, shortness of breath on minimal exertion, fatigue, and abnormal nuclear stress test.  DESCRIPTION OF PROCEDURE: The patient was prepped and draped appropriately with right groin exposed.  Under local anesthesia, a 4-French sheath was placed in the right femoral artery.  Hemodynamic and electrocardiographic monitoring were continuous.  The patient tolerated the procedure with no known complications.  Right heart catheterization was performed using Swan-Ganz catheter.  Left heart catheterization was performed using left and right Judkins catheter.  Left ventriculography was performed using pigtail catheter.  HEMODYNAMICS: Pulmonary capillary wedge pressure is 10.  PA pressure is 28/10.  RA pressure is 30/5.  RA pressure is 5.  LV pressure is 160/14.  Aorta is 160/79.  LEFT VENTRICULOGRAPHY: Shows normal LV systolic function with ejection fraction 60% to 65%.  CORONARIES: Left coronary artery:  Left main shows calcification.  There is a 40% to distal left main.  There is also 30% to 40% stenosis at the ostium.  LAD is heavily calcified and is 70% occluded.  Proximally, the LAD fills retrograde by the left circumflex.  Left circumflex shows luminal irregularities, it is a tortuous vessel.  There is no significant lesion in the left circumflex.  The right coronary artery is a large, dominant vessel.  It shows 50% to 70% stenosis in the midsegment and in the distal segment.  IMPRESSION: 1.  Left main disease. 2.  Significant 2-vessel coronary artery disease. 3.  Left ventricular diastolic dysfunction.           Procedures performed:   No orders of  the defined types were placed in this encounter.         Hospital Course:   See daily progress notes. Admitted with cough, sob. Dx with acute asthmatic bronchitis treated with IV steroids, antibiotics, mucinex, advair, nebs.  CXR negative for pna.  Stable for [place] home on po prednisone taper, antibiotics, nebulizer prn.          Discharge Instructions:   [place] [facility] with services    Follow-up with:    Burr Medico, MD  [address]  [place] [address]  [phone]    In 1 week        Signed by: Erin Fulling NP  Seen/examined by dr. Graciela Husbands plan of care discussed. In agreement with above                   Pt seen and examined by me. I have reviewed the notes, assessments, and/or procedures performed by Nicholes Calamity, I concur with her/his documentation  of [place] P Kle[place].

## 2013-06-13 NOTE — Treatment Plan (Signed)
Rounded with Dr Graciela Husbands      Today's subjective data:  Pt is strong and up to bathroom, family reports pt's strength is returning.  Pt lungs are improved.  Pt is alert and oriented      Today's Plan:  Pt is to d/c home with Solumedrol and levaquin for bronchitis.  Pt to get home health and neb treatments at home      Expected discharge date: 06/13/2013        Patient Active Problem List   Diagnosis   . Hypertension   . Weakness generalized   . Bronchitis     Past Medical History   Diagnosis Date   . Arthritis    . Hypertensive disorder    . Lung disease      Bronchitis   . Syncope and collapse      Weak with Wt. loss has difficulty walking   . GERD (gastroesophageal reflux disease)    . Difficulty in walking(719.7)    . Cataracts, bilateral    . Vision abnormalities    . Hearing loss NEC    . Depression

## 2013-06-13 NOTE — Progress Notes (Signed)
Pt was cleared to d/c by Dr Graciela Husbands.  Pt set up with home health.  Home health liason in to see pt.  Pt discharge education and teaching given.  Pt and family verbalized understanding.  Peripheral IV d/c intact.  Tele box removed.  Pt d/c with daughter.

## 2013-06-13 NOTE — Progress Notes (Signed)
Voicemail referral  received from Debara Pickett RN, CM [phone]) indicating that patient was active with [employer] [phone]).  Company called and verified that patient is active and had been receiving RN, PT and OT home health services prior to current admission.  Rudene Anda RN, spoke with patient's son who agreed to resumption of care upon discharge.  Will continue to follow for discharge needs.

## 2013-06-13 NOTE — PT Eval Note (Signed)
[facility]  Physical Therapy Evaluation     Patient: Jane [name]     MRN#: [id number]   Unit: [facility]  Bed: A2527/A2527-B    Time of treatment: Time Calculation  PT Received On: 06/13/13  Start Time: 1112  Stop Time: 1140  Time Calculation (min): 28 min    Consult received for Jane [name] for PT Evaluation and Treatment.  Patient's medical condition is appropriate for Physical therapy intervention at this time.    Activity Orders: Bed rest orders expired    Precautions and Contraindications:   Precautions  Weight Bearing Status: no restrictions  Other Precautions: Fall Precautions    Medical Diagnosis: Bronchitis [490]  Essential (primary) hypertension [401.9]  Bronchitis    History of Present Illness: Jane [name] is a 79 y.o. female admitted on 06/10/2013 reports cough, SOB since yesterday.      Patient Active Problem List   Diagnosis   . Hypertension   . Weakness generalized   . Bronchitis        Past Medical/Surgical History:  Past Medical History   Diagnosis Date   . Arthritis    . Hypertensive disorder    . Lung disease      Bronchitis   . Syncope and collapse      Weak with Wt. loss has difficulty walking   . GERD (gastroesophageal reflux disease)    . Difficulty in walking(719.7)    . Cataracts, bilateral    . Vision abnormalities    . Hearing loss NEC    . Depression       Past Surgical History   Procedure Date   . Upper gastrointestinal endoscopy          X-Rays/Tests/Labs:  Lab Results   Component Value Date/Time    HGB 12.5 06/13/2013  4:23 AM    HCT 36.9* 06/13/2013  4:23 AM    K 4.2 06/13/2013  4:23 AM    NA 135* 06/13/2013  4:23 AM    TROPI 0.04 06/11/2013  7:02 PM    TROPI 0.02 06/11/2013  1:38 PM    TROPI <0.01 06/11/2013  5:31 AM    TROPI 0.01 06/10/2013 11:19 PM   Chest AP Portable   IMPRESSION:   No active disease in the chest     XR Chest AP Portable   IMPRESSION:   No acute cardiopulmonary process      Social History:  Prior Level of Function  Prior level of function:  Ambulates with assistive device  Assistive Device: Single point cane;Front wheel walker  Baseline Activity Level: Household ambulation  DME Currently at Home: Single point cane;Front wheel walker    Home Living Arrangements  Living Arrangements: Children;Family members  Type of Home: House  Home Layout: Two level (+ 1 STE, Can stay on LL if needed. Full bath on LL)  DME Currently at Home: Single point cane;Front wheel walker    Subjective: Patient is agreeable to participation in the therapy session. Nursing clears patient for therapy.   Patient Goal:  (Go home)       Objective:  Observation of Patient/Vital Signs:VSS    Patient received in bed with Telemetry and Intravenous (IV) in place.         Cognitive Status and Neuro Exam:  Cognition  Arousal/Alertness: Appropriate responses to stimuli  Attention Span: Appears intact  Memory: Appears intact  Following Commands: Follows one step commands without difficulty  Safety Awareness: minimal verbal instruction  Insights:  Fully aware of deficits  Problem Solving: Unable to assess  Comments:  (A & O x 3)       Musculoskeletal Examination  Gross ROM  Right Upper Extremity ROM: within functional limits  Left Upper Extremity ROM: within functional limits  Right Lower Extremity ROM: within functional limits  Left Lower Extremity ROM: within functional limits  Gross Strength  Right Upper Extremity Strength: within functional limits  Left Upper Extremity Strength: within functional limits  Right Lower Extremity Strength: within functional limits  Left Lower Extremity Strength: within functional limits       Functional Mobility:  Functional Mobility  Rolling: Stand by assistance  Supine to Sit: Stand by assistance  Scooting: Stand by assistance  Sit to Supine: Stand by assistance  Sit to Stand: Stand by assistance  Stand to Sit: Stand by assistance  Transfers  Bed to Chair: Stand by assistance  Locomotion  Ambulation: stand by assistance  Ambulation Distance (Feet):  (150 ft  with FWW)  Pattern:  (Dec hip/knee flex, dec step length, dec cadence)     Balance  Balance  Standing - Static:  (Good - with FWW support)  Standing - Dynamic:  (Fair + with FWW support)    Participation and Activity Tolerance  Participation and Endurance  Participation Effort: good  Endurance: Tolerates < 10 min exercise, no significant change in vital signs    Educated the patient to role of physical therapy, plan of care, goals of therapy and safety with mobility and ADLs, home safety.   Patient left in bed with {and call bell within reach. RN notified of session outcome.     Assessment: Jane [name] is a 79 y.o. female admitted 06/10/2013.    Pt presents with the following impairments: Assessment: Decreased functional mobility;Decreased balance;Gait impairment;Decreased endurance/activity tolerance.    Pt would benefit from Physical Therapy to maximize functional potential in order to return home with family support.    Treatment initiated immediately after eval: Family educated on safety at home and use of FWW for support. Family verbalizes understanding.      Plan: Treatment/Interventions: Gait training;Endurance training;Equipment eval/education;Continued evaluation;Patient/family training;Functional transfer training   PT Frequency: 3-4x/wk   Risks/Benefits/POC Discussed with Pt/Family: With patient/family   Assess stairs    Goals:   Goals  Goal Formulation: With patient/family  Time for Goal Acheivement: By time of discharge  Pt Will Go Supine To Sit: Independently;to maximize functional mobility and independence  Pt Will Transfer Bed/Chair: Independently;to maximize functional mobility and independence  Pt Will Ambulate: 151-200 feet (with LRAD modified independent.)  Pt Will Perform Home Exer Program: Independently;to maximize functional mobility and independence        DME Recommended for Discharge:  (none)  Discharge Recommendation: Home with supervision;Home with home health PT;    Donia Pounds, MSPT   [phone]  06/13/2013 1:35 PM

## 2013-06-13 NOTE — Plan of Care (Addendum)
Pt awaiting discharge Case management needs to see pt about home health.  Family informed and waiting to leave.  Family very anxious to discharge pt.      PINC- ISHAPED done at bedside. Plan of care discussed with pt: meds, IV antibiotics, neb treatments, discharge planning.  Pt and family verbalized understanding.  White board updated.  Will continue to monitor and update to any changes.      Problem: Psychosocial and Spiritual Needs  Goal: Demonstrates ability to cope with hospitalization/illness  Outcome: Progressing  Family is very anxious to d/c pt home as soon as they could.  Family wants to know what is holding pt here and feels that she is doing much better.  They feel that the hospital is a waste of their time and money now that pt is doing well.  Informed family that pt has to be weaned off of here steriod and that is why she is still here.  Told family that I would call the doctor for more information of the plan of care for the pt.

## 2013-06-13 NOTE — Progress Notes (Signed)
Late entry: Met with pt and dtr prior to discharge to inform them that previous home health care from [facility] will be resumed on discharge.  HH liaison notified.  Dtr transported home.

## 2013-06-13 NOTE — Plan of Care (Signed)
Problem: Pain  Goal: Patient's pain/discomfort is manageable  Outcome: Progressing    -Pt reported pain around ribcage r/t prolonged coughing spells. Pt denies need for pain medication. PRN Tessalon given as ordered for cough. Upon reassessment, pt was asleep. Family at bedside reported relief from coughing. Will con't to monitor.

## 2013-06-13 NOTE — Discharge Instructions (Addendum)
MEDICATION: SOLUMEDROL[Injection]  You were given a dose of SOLUMEDROL (generic name: methyl prednisolone). This is a steroid medicine and it reduces inflammation, swelling and allergic reactions in all parts of the body. It is commonly given by injection or by vein in the case of severe asthma, emphysema, COPD and allergic reactions. The anti-inflammatory treatment is often continued as an oral medicine (such as prednisone, Decadron, Medrol and others).  WHAT TO WATCH FOR:  POSSIBLE SIDE EFFECTS: Local inflammation at the site of injection or where the catheter was placed in the vein (redness, pain, swelling) --> Contact your doctor or this facility if this occurs.  ----------IMPORTANT----------  WARNING:  -- If you are diabetic, this medicine may make your blood sugar go higher than usual for a short period of time. Monitor your blood sugars daily and report any problems to your doctor.   [NOTE: This information topic may not include all directions, precautions, medical conditions, drug/food interactions and warnings for this drug. Check with your doctor, nurse or pharmacist for any questions that you may have.]   [address] [place], [place] [address]. All rights reserved. This information is not intended as a substitute for professional medical care. Always follow your   healthcare professional's instructions.          Bronchitis (Adult: Abx Tx)    BRONCHITIS is an infection of the air passages ("bronchial tubes"). It often occurs during the common cold. Symptoms include cough with mucus (phlegm) and low-grade fever. Bronchitis usually lasts 7-14 days. Mild cases can be treated with simple home remedies. More severe infection is treated with an antibiotic.    Home Care:  1. If symptoms are severe, rest at home for the first 2-3 days. When you resume activity, don't let yourself get too tired.  2. Do not smoke. Avoid being exposed to the smoke of others.  3. You may use  acetaminophen (Tylenol) or ibuprofen (Motrin, Advil) to control fever or pain, unless another medicine was prescribed for this. [NOTE: If you have chronic liver or kidney disease or ever had a stomach ulcer or GI bleeding, talk with your doctor before using these medicines.]  4. Your appetite may be poor, so a light diet is fine. Avoid dehydration by drinking 6-8 glasses of fluids per day (water, soft, drinks, juices, tea, soup, etc.). Extra fluids will help loosen secretions in the lungs.  5. Over-the-counter cough medicines that contain"dextromethorphan"(such as Robitussin DM) and decongestants (Actifed or Sudafed) may help relieve cough and congestion. [NOTE: Do not use decongestants if you have high blood pressure.]  6. Finish all antibiotic medicine, even if you are feeling better after only a few days.  Follow Up  with your doctor or as directed if you don't start to feel better after three days.  [NOTE: If you are age 37 or older, or if you have chronic asthma or COPD, we recommend a PNEUMOCOCCAL VACCINATION every five years and a yearly INFLUENZAVACCINATION (FLU-SHOT) every autumn. Ask your doctor about this. If you had an X-ray, a radiologist will review it. You will be notified of any new findings that may affect your care.]  Get Prompt Medical Attention  if any of the following occur:   Fever over 100.41F (38.0C) for more than three days   Trouble breathing, wheezing or pain with breathing   Coughing up blood or increased amounts of colored sputum   Weakness, drowsiness, headache, facial pain, ear pain or a stiff neck   2000-2014  [address], [place], [place] [address]. All rights reserved. This information is not intended as a substitute for professional medical care. Always follow your healthcare professional's instructions.    Date Time: 06/13/2013 1:26 PM  Attending Physician: Gean Quint, MD    Date of Admission:   06/10/2013    Reason for Admission:   Bronchitis  [490]  Essential (primary) hypertension [401.9]  Bronchitis    Follow up:        Neurology (If Stroke): ______________________  Phone:______________________    Other:___________________________________   Phone: ______________________       Medications:    Your medications have been listed for you on the Medication Reconciliation Discharge Home List. Please bring a copy of all discharge instructions, including your Medication Reconciliation Discharge Home List when you visit your physician.      Continue taking all medications even if you feel well, unless otherwise instructed by physician.    Do not take any over-the-counter medications or herbal supplements without checking with your pharmacist or doctor.       If you are taking Warfarin, please follow up with (health professional/clinic) ____________ on _____________to have your PT/INR blood test checked.    Activity:    Rise slowly from a sitting or lying position. Increase activity slowly, unless otherwise instructed by physician.    Perform exercises as desginated by Therapist and Physician.    In the event of severe shortness of breath or chest discomfort, call 911. Do NOT drive to the hospital.    Speak with your physician regarding specific driving and/or work restrictions.    Diet:    If you have special diet orders, you have been given printed diet instructions.   ________________________________________________________________________    Tobacco Cessation Counseling:  If you are currently a tobacco user or have used tobacco within the last 12 months, we have provided you with written Tobacco Cessation Counseling.  ________________________________________________________________________    Heart Failure Education:  If you have a diagnosis of a Heart Failure, we have provided you with written Heart Failure Education.     Weigh yourself once a day at the same time. Record and bring the weight record to your next physician appointment.     Call your doctor if  you gain more than 3 pounds in one day or 5 pounds in one week, or if you experience shortness of breath, leg swelling and/or chest discomfort.     Enroll in the [facility] Tel-Assurance Program, a heart failure patient support program. Call [phone] for additional details.   ________________________________________________________________________    [facility] Education:    Call 911 for:    Sudden numbness or weakness of the face    Sudden numbness of the arm or leg especially one side of the body    Sudden confusion, trouble speaking or understanding    Sudden trouble seeing in one or both eyes    Sudden trouble walking or dizziness, loss of balance or coordination        For promotion of your health, we have provided you with personalized written education on risk factors specific to your diagnosis, including but not limited to:    High Blood Pressure    High Cholesterol    Atrial Fibrillation    Overweight    Diabetes    Smoking         Vaccinations  Pneumonia Vaccine Received on:  Flu Vaccine Received on:  Treatments/Special Instructions:                 Signed by: Angelica Chessman, RN    I HAVE RECEIVED AND UNDERSTAND THESE DISCHARGE INSTRUCTIONS.    Home Health Discharge Information   Your doctor has ordered Skilled Nursing, Physical Therapy and Occupational Therapy in-home service(s) for you while you recuperate at home, to assist you in the transition from hospital to home.     The agency that you or your representative chose to provide the service:   Name: [facility] Phone #: [phone]     The above services were set up by:   Desma Maxim RN/ Rudene Anda RN [facility])     Signed by: Emiliano Dyer   Date Time: 06/13/2013 1:59 PM

## 2013-06-13 NOTE — Plan of Care (Signed)
Severe Sepsis Screen  Date: 06/13/2013 Time: 2:12 PM  Nurse Signature: Angelica Chessman    A. Infection:    Does your patient have ONE or more of the following infection criteria?     [x]  Documented Infection - Does the patient have positive culture results (from blood,        sputum, urine, etc)?   [x]  Anti-Infective Therapy - Is the patient receiving antibiotic, antifungal, or other                anti-infective therapy?   []  Pneumonia - Is there documentation of pneumonia (X Ray, etc)?   []  WBC's - Have WBC's been found in normally sterile fluid (urine, CSF, etc.)?   []  Perforated Viscus -Does the patient have a perforated hollow organ (bowel)?    A.  Did you check any of the boxes above?    []  No  If No, Stop Here and Sepsis Screen Negative.               [x]  Yes, continue to section B      B. SIRS:     Does your patient have TWO or more of the following SIRS criteria (ensure vital signs & temperature are within 1 hour of this screening)?    []  Temperature - Is the patient's temperature: Temp: 96.5 F (35.8 C) (06/13/13 1058)   - Greater than or equal to 38.3 degrees C (greater than 100.9 degrees F)?   - Less than or equal to 36 degrees C (less than or equal to 96.8 degrees F)?    [x]  Heart Rate: Heart Rate: 95  (06/13/13 1058)   - Is the patient's heart rate greater than or equal to 90 bpm?    []  Respiratory: Resp Rate: 18  (06/13/13 1058)   - Is the patient's respiratory rate greater than or equal to 20?    [x]  WBC Count - Is the patient's WBC count:   Lab 06/13/13 0423   WBC 17.32*      - Greater than or equal to 12,000/mm3 OR   - Less than or equal to 4,000/mm3 OR    - Are there greater than 10% immature neutrophils (bands)?    []  Glucose >140 without diabetes?   Lab 06/13/13 0423   GLU 172*       []  Significant edema?    B.  Did you check two or more of the boxes above?     []  No, Stop Here and Sepsis Screen Negative   [x]  Yes, continue to section C    C.  Acute Organ Dysfunction     Does your patient have ONE or  more of the following organ dysfunction? (May need to wait for lab results for assessment - see below) Organ dysfunction must be a result of the sepsis not chronic conditions.    []  Cardiovascular - Does the patient have a: BP: 128/63 mmHg (06/13/13 1058)   -systolic Blood pressure less than or equal to 90 mmHg OR   -systolic blood pressure has dropped 40 mmHg or more from baseline OR   -mean arterial pressure less than or equal to 70 mmHg (for at least one hour   despite fluid resuscitation OR require vasopressor support?  []  Respiratory - Does the patient have new hypoxia defined by any of the following"   -A sustained increase in oxygen requirements by at least 2L/min on NC or 28%    FiO2 within the last 24 hrs OR   -  A persistent decrease in oxygen saturation of greater than or equal to 5% lasting   at least four or more hours and occurring within the last 24 hours  []  Renal - Does the patient have:   -low urine output (e.g. Less than 0.21mL/kg/HR for one hour despite adequate fluid    resuscitation, OR   -Increased creatinine (greater than 50% increase from baseline) OR   -require acute dialysis?  []  Hematologic - Does the patient have a:   -Low platelet count (less than 100,000 mm3)   Lab 06/13/13 0423   PLT 317   OR   -INR/aPTT greater than upper limit of normal?No results found for this basename: INR:1 in the last 168 hours or                No results found for this basename: APTT:1 in the last 168 hours  []  Metabolic - Does the patient have a high lactate (plasma lactate greater than or equal to 2.4 mMol/L)? No results found for this basename: LACTATE:5 in the last 168 hours    []  Hepatic - Are the patient's liver enzymes elevated (ALT greater than 72 IU/L or Total      Bilirubin greater than 2 MG/dL)?    Lab 06/13/13 0423   BILITOTAL 0.2   ALT 33     []  CNS - Does the patient have altered consciousness or reduced Glasgow Coma      Scale?    C.  Did you check any of the boxes above?     [x]  No, Sepsis Screen  Negative   []  YES:  A) Infection + B) SIRS + C) Organ Dysfunction = Positive Screen for Severe Sepsis     Notify MD and document in Complex Assessment under provider notification   - Name of physician notified:                                           - Date/Time Notifiied:                                             Document actions: Following must be completed within 1 hour of positive sepsis screen   []  Lactate drawn (if initial lactate > , repeat lactate in 2 hours for goal decrease 10-20%)   []  Blood Cultures obtained (prior to antibiotic administration; if not done within the last 48 hours)   []  Antibiotics initiated or modified    []   IV Fluid administered 0.9% NS __________ mLs given (Initial Bolus of 30 ml/kg if SBP < 90 or MAP < 65 or lactate greater than 4 mmol/dl)  Nursing Comments?:     _________________________________________________________________    Patients meeting the following criteria are excluded from screening (check if applicable):   []  [place] hypothermia protocol   []  Comfort Care orders   []  Surgery- No screening for 24 hours after surgery (48 hours after CV surgery)       -If Yes. Date of surgery:______________________   []  Admitted with sepsis and until 72 hours after admission with documented sepsis (RESUME SEPSIS SCREEN AFTER 72 hour window!!!)      -If Yes, Date of Documented Sepsis:______________________   []  Positive screen AND Completed sepsis bundle during previous 24 hours       -  If Yes, Date/Time of positive severe sepsis screen:_______________________

## 2013-06-13 NOTE — Plan of Care (Signed)
Problem: Physical Therapy  Goal: Patient condition is improving per Physical Therapy Treatment Plan  Outcome: Progressing  Discharge Recommendation: Home with supervision;Home with home health PT    Treatment/Interventions: Gait training;Endurance training;Equipment eval/education;Continued evaluation;Patient/family training;Functional transfer training  PT Frequency: 3-4x/wk     Goals:   Goals  Goal Formulation: With patient/family  Time for Goal Acheivement: By time of discharge  Pt Will Go Supine To Sit: Independently;to maximize functional mobility and independence  Pt Will Transfer Bed/Chair: Independently;to maximize functional mobility and independence  Pt Will Ambulate: 151-200 feet (with LRAD modified independent.)  Pt Will Perform Home Exer Program: Independently;to maximize functional mobility and independence

## 2013-08-22 ENCOUNTER — Ambulatory Visit: Payer: Medicare Other

## 2013-10-06 ENCOUNTER — Emergency Department
Admission: EM | Admit: 2013-10-06 | Discharge: 2013-10-06 | Disposition: A | Discharge status: Home / Self Care | Payer: Medicare Other | Attending: Emergency Medicine | Admitting: Emergency Medicine

## 2013-10-06 ENCOUNTER — Emergency Department: Payer: Medicare Other

## 2013-10-06 ENCOUNTER — Inpatient Hospital Stay: Payer: Medicare Other

## 2013-10-06 DIAGNOSIS — J40 Bronchitis, not specified as acute or chronic: Secondary | ICD-10-CM | POA: Insufficient documentation

## 2013-10-06 DIAGNOSIS — R0602 Shortness of breath: Secondary | ICD-10-CM | POA: Diagnosis present

## 2013-10-06 LAB — POCT URINALYSIS AUTOMATED (IAH)
Bilirubin, UA POCT: NEGATIVE
Glucose, UA POCT: NEGATIVE
Ketones, UA POCT: NEGATIVE mg/dL
Nitrite, UA POCT: NEGATIVE
PH, UA POCT: 7.5 (ref 4.6–8)
Protein, UA POCT: NEGATIVE mg/dL
Specific Gravity, UA POCT: 1.015 mg/dL (ref 1.001–1.035)
Urine Leukocytes POCT: NEGATIVE
Urobilinogen, UA POCT: 0.2 mg/dL

## 2013-10-06 LAB — CBC AND DIFFERENTIAL
Basophils Absolute Automated: 0.02 (ref 0.00–0.20)
Basophils Automated: 0 %
Eosinophils Absolute Automated: 0.06 (ref 0.00–0.70)
Eosinophils Automated: 1 %
Hematocrit: 39.3 % (ref 37.0–47.0)
Hgb: 13.4 g/dL (ref 12.0–16.0)
Immature Granulocytes Absolute: 0.01
Immature Granulocytes: 0 %
Lymphocytes Absolute Automated: 0.83 (ref 0.50–4.40)
Lymphocytes Automated: 20 %
MCH: 29.5 pg (ref 28.0–32.0)
MCHC: 34.1 g/dL (ref 32.0–36.0)
MCV: 86.4 fL (ref 80.0–100.0)
MPV: 8.8 fL — ABNORMAL LOW (ref 9.4–12.3)
Monocytes Absolute Automated: 0.51 (ref 0.00–1.20)
Monocytes: 12 %
Neutrophils Absolute: 2.73 (ref 1.80–8.10)
Neutrophils: 66 %
Nucleated RBC: 0 (ref 0–1)
Platelets: 204 (ref 140–400)
RBC: 4.55 (ref 4.20–5.40)
RDW: 13 % (ref 12–15)
WBC: 4.15 (ref 3.50–10.80)

## 2013-10-06 LAB — COMPREHENSIVE METABOLIC PANEL
ALT: 14 U/L (ref 0–55)
AST (SGOT): 27 U/L (ref 5–34)
Albumin/Globulin Ratio: 1.2 (ref 0.9–2.2)
Albumin: 3.7 g/dL (ref 3.5–5.0)
Alkaline Phosphatase: 51 U/L (ref 40–150)
Anion Gap: 11 (ref 5.0–15.0)
BUN: 8 mg/dL (ref 7.0–19.0)
Bilirubin, Total: 0.3 mg/dL (ref 0.2–1.2)
CO2: 28 mEq/L (ref 22–29)
Calcium: 9.7 mg/dL (ref 7.9–10.6)
Chloride: 92 mEq/L — ABNORMAL LOW (ref 98–107)
Creatinine: 0.8 mg/dL (ref 0.6–1.0)
Globulin: 3.1 g/dL (ref 2.0–3.6)
Glucose: 118 mg/dL — ABNORMAL HIGH (ref 70–100)
Potassium: 3.5 mEq/L (ref 3.5–5.1)
Protein, Total: 6.8 g/dL (ref 6.0–8.3)
Sodium: 131 mEq/L — ABNORMAL LOW (ref 136–145)

## 2013-10-06 LAB — GFR: EGFR: >60

## 2013-10-06 LAB — B-TYPE NATRIURETIC PEPTIDE: B-Natriuretic Peptide: 44 pg/mL (ref 0–100)

## 2013-10-06 LAB — HEMOLYSIS INDEX: Hemolysis Index: 24 — ABNORMAL HIGH (ref 0–18)

## 2013-10-06 LAB — TROPONIN I: Troponin I: <0.01 ng/mL

## 2013-10-06 MED ORDER — LEVOFLOXACIN 750 MG PO TABS
750.00 mg | ORAL_TABLET | Freq: Every day | ORAL | Status: AC
Start: 2013-10-06 — End: 2013-10-12

## 2013-10-06 MED ORDER — LEVOFLOXACIN IN D5W 750 MG/150ML IV SOLN
750.00 mg | Freq: Once | INTRAVENOUS | Status: AC
Start: 2013-10-06 — End: 2013-10-06
  Administered 2013-10-06: 750 mg via INTRAVENOUS
  Filled 2013-10-06: qty 150

## 2013-10-06 MED ORDER — ASPIRIN 81 MG PO CHEW
324.00 mg | CHEWABLE_TABLET | Freq: Once | ORAL | Status: AC
Start: 2013-10-06 — End: 2013-10-06
  Administered 2013-10-06: 324 mg via ORAL
  Filled 2013-10-06: qty 4

## 2013-10-06 MED ORDER — PREDNISONE 20 MG PO TABS
60.0000 mg | ORAL_TABLET | Freq: Every day | ORAL | Status: AC
Start: 2013-10-06 — End: 2013-10-10

## 2013-10-06 MED ORDER — METHYLPREDNISOLONE SODIUM SUCC 125 MG IJ SOLR
125.00 mg | Freq: Once | INTRAMUSCULAR | Status: AC
Start: 2013-10-06 — End: 2013-10-06
  Administered 2013-10-06: 125 mg via INTRAVENOUS
  Filled 2013-10-06: qty 2

## 2013-10-06 MED ORDER — ALBUTEROL SULFATE (2.5 MG/3ML) 0.083% IN NEBU
2.5000 mg | INHALATION_SOLUTION | Freq: Once | RESPIRATORY_TRACT | Status: AC
Start: 2013-10-06 — End: 2013-10-06
  Administered 2013-10-06: 2.5 mg via RESPIRATORY_TRACT
  Filled 2013-10-06: qty 3

## 2013-10-06 MED ORDER — PREDNISONE 20 MG PO TABS
40.0000 mg | ORAL_TABLET | Freq: Every day | ORAL | Status: DC
Start: 2013-10-06 — End: 2013-10-06

## 2013-10-06 MED ORDER — LABETALOL HCL 5 MG/ML IV SOLN
10.0000 mg | Freq: Once | INTRAVENOUS | Status: DC
Start: 2013-10-06 — End: 2013-10-06

## 2013-10-06 MED ORDER — ALBUTEROL SULFATE (2.5 MG/3ML) 0.083% IN NEBU
2.50 mg | INHALATION_SOLUTION | RESPIRATORY_TRACT | Status: AC | PRN
Start: 2013-10-06 — End: 2013-11-05

## 2013-10-06 NOTE — ED Notes (Signed)
Pt. Had to wait for Levaquin abx to finish in IV prior to discharge. When RN went to [place] pt., family had to grab pants from car.

## 2013-10-06 NOTE — ED Notes (Signed)
Gradual onset of breathing x 1 week, worst this am, given nebulizer at home, productive yellow/ white phlegm. Denies any fevers

## 2013-10-06 NOTE — ED Provider Notes (Signed)
EMERGENCY DEPARTMENT HISTORY AND PHYSICAL EXAM     Physician/Midlevel provider first contact with patient: 10/06/13 1612         Date: 10/06/2013  Patient Name: Jane [name]    History of Presenting Illness     Chief Complaint   Patient presents with   . Difficulty breathing       History Provided By: Patient's family members     Chief Complaint: difficulty breathing   Onset: x few days ago   Timing: gradually worsening   Location: n/a   Quality: n/a   Severity: moderate   Modifying Factors: worsened when lying down; bettered when sitting up; given Mucinex and albuterol w/ relief; given Lasix w/ relief   Associated Symptoms: cough, generalized weakness, loss of appetite    Additional History: Jane [name] is a 79 y.o. female w/ hx of CHF, COPD, asthma, and HTN c/o gradually worsening difficulty breathing w/ associated coughing, generalized weakness, and loss of appetite x the last few day. SOB worsened this morning. Her SOB is worsened when lying down and bettered when sitting up. Pt given Mucinex and albuterol w/ relief; also given Lasix this morning w/ relief; responded well to it (able to urinate); takes Lasix only as needed. Pt normally walks with a cane but today had difficulty getting around because of her weakness. Pt denies fever, CP, abd pain, or blood in her stool. Pt also c/o constipation w/ increased gas.     Dr. Graciela Husbands, pt's pulmonologist      Dr. Philip Aspen, pt's cardiologist      PCP: Burr Medico, MD      Current Facility-Administered Medications   Medication Dose Route Frequency Provider Last Rate Last Dose   . [COMPLETED] albuterol (PROVENTIL) nebulizer solution 2.5 mg  2.5 mg Nebulization Once Kely Dohn, MD   2.5 mg at 10/06/13 1702   . [COMPLETED] aspirin chewable tablet 324 mg  324 mg Oral Once Melva Faux, MD   324 mg at 10/06/13 1654   . levofloxacin (LEVAQUIN) 750mg  in D5W IVPB (premix)  750 mg Intravenous Once Tzippy Testerman, MD   750 mg at 10/06/13 1748   . [COMPLETED]  methylprednisolone sodium succinate (Solu-MEDROL) injection 125 mg  125 mg Intravenous Once Amando Chaput, MD   125 mg at 10/06/13 1747   . [DISCONTINUED] labetalol (NORMODYNE,TRANDATE) injection 10 mg  10 mg Intravenous Once Shaana Acocella, MD         Current Outpatient Prescriptions   Medication Sig Dispense Refill   . pantoprazole (PROTONIX) 40 MG tablet Take 40 mg by mouth daily.       Marland Kitchen albuterol (PROVENTIL) (2.5 MG/3ML) 0.083% nebulizer solution Take 3 mLs (2.5 mg total) by nebulization every 6 (six) hours as needed for Wheezing or Shortness of Breath.  75 mL  0   . aspirin EC 81 MG EC tablet Take 1 tablet (81 mg total) by mouth daily.  30 tablet  0   . benzonatate (TESSALON) 100 MG capsule Take 1 capsule (100 mg total) by mouth 3 (three) times daily as needed for Cough.  20 capsule  0   . calcium-vitamin D (OSCAL-500) 500-200 MG-UNIT per tablet Take 1 tablet by mouth daily.       . carvedilol (COREG) 12.5 MG tablet Take 1 tablet (12.5 mg total) by mouth 2 (two) times daily with meals.  60 tablet  0   . clopidogrel (PLAVIX) 75 mg tablet Take 1 tablet (75 mg total) by mouth daily.  30 tablet  0   . fluticasone-salmeterol (ADVAIR DISKUS) 100-50 MCG/DOSE AEPB Inhale 1 puff into the lungs.       . furosemide (LASIX) 20 MG tablet Take 20 mg by mouth 2 (two) times daily.       Marland Kitchen guaiFENesin (MUCINEX) 600 MG 12 hr tablet Take 1,200 mg by mouth 2 (two) times daily.       . Multiple Vitamins-Minerals (CENTRUM SILVER PO) Take by mouth.       . nitroglycerin (NITRODUR) 0.2 MG/HR Place 1 patch onto the skin daily.  30 patch  0   . predniSONE (DELTASONE) 5 MG tablet Take 12 tablets (60 mg total) by mouth daily. For 1 day,then decrease by 5 mg daily until finished  78 tablet  0   . simvastatin (ZOCOR) 20 MG tablet Take 20 mg by mouth nightly.       . sodium hypochlorite (DAKIN'S HALF-STRENGTH) external solution Apply topically once.           Past History     Past Medical History:  Past Medical History   Diagnosis Date   .  Arthritis    . Hypertensive disorder    . Lung disease      Bronchitis   . Syncope and collapse      Weak with Wt. loss has difficulty walking   . GERD (gastroesophageal reflux disease)    . Difficulty in walking(719.7)    . Cataracts, bilateral    . Vision abnormalities    . Hearing loss NEC    . Depression    . Asthma without status asthmaticus    . Chronic obstructive pulmonary disease        Past Surgical History:  Past Surgical History   Procedure Date   . Upper gastrointestinal endoscopy        Family History:  History reviewed. No pertinent family history.    Social History:  History   Substance Use Topics   . Smoking status: Never Smoker    . Smokeless tobacco: Never Used   . Alcohol Use: No       Allergies:  No Known Allergies    Review of Systems     Review of Systems   Constitutional: Negative for fever and diaphoresis.   HENT: Negative for ear discharge and nosebleeds.    Eyes: Negative for discharge and redness.   Respiratory: Negative for cough.    Cardiovascular: Negative for chest pain and leg swelling.   Gastrointestinal: Positive for constipation. Negative for vomiting and abdominal pain.        + increased gas   Skin: Negative for rash.   Neurological: Negative for tremors.   Endo/Heme/Allergies:        + NKDAs   Psychiatric/Behavioral: Negative for suicidal ideas.         Physical Exam   BP 140/108  Pulse 92  Temp 96.3 F (35.7 C)  Resp 20  SpO2 98%    Physical Exam   Nursing note and vitals reviewed.  Constitutional: She is oriented to person, place, and time. She appears well-developed and well-nourished. No distress.        Frail, cachectic appearing elderly female   HENT:   Head: Normocephalic and atraumatic.   Eyes: Conjunctivae normal are normal. No scleral icterus.   Cardiovascular: Normal rate, regular rhythm and normal heart sounds.  Exam reveals no gallop and no friction rub.    No murmur heard.  Pulmonary/Chest: Effort normal. No respiratory distress.  She has no wheezes. She has  rales (diffuse crackles). She exhibits no tenderness.   Abdominal: Soft. She exhibits no distension and no mass. There is no tenderness. There is no rebound and no guarding.   Musculoskeletal: Normal range of motion. She exhibits no edema (no swelling in BLE).   Neurological: She is alert and oriented to person, place, and time.   Skin: Skin is warm and dry. No rash noted.   Psychiatric: She has a normal mood and affect. Her behavior is normal. Judgment and thought content normal.       Diagnostic Study Results     Labs -     Results     Procedure Component Value Units Date/Time    Influenza A/B Virus Antigen [[id number]] Collected:10/06/13 1753    Specimen Information:Nasopharyngeal / Nasal Wash Updated:10/06/13 1825    Narrative:    ORDER#: [id number]                                    ORDERED BY: Natalee Tomkiewicz  SOURCE: Nasal Wash                                   COLLECTED:  10/06/13 17:53  ANTIBIOTICS AT COLL.:                                RECEIVED :  10/06/13 17:57  Influenza Rapid Antigen A&B                FINAL       10/06/13 18:25  10/06/13   Negative for Influenza A and B             Reference Range: Negative      UA POC (POCT UA Clinitek AX) [[id number]]  (Abnormal) Collected:10/06/13 1759     Color UA POCT Yellow Updated:10/06/13 1759     Clarity UA POCT Clear      Glucose, UA POCT Negative      Bilirubin, UA POCT Negative      Ketones, UA POCT Negative mg/dL      Specific Gravity, UA POCT 1.015 mg/dL      Blood, UA POCT  Trace - intact (A)      PH, UA POCT 7.5      Protein, UA POCT Negative mg/dL      Urobilinogen, UA POCT 0.2 mg/dL      Nitrite, UA POCT Negative      Leukocytes, UA POCT Negative     Troponin I [[id number]] Collected:10/06/13 1645    Specimen Information:Blood Updated:10/06/13 1758     Troponin I <0.01 ng/mL     Comprehensive metabolic panel [[id number]]  (Abnormal) Collected:10/06/13 1645    Specimen Information:Blood Updated:10/06/13 1751     Glucose 118 (H) mg/dL      BUN 8.0 mg/dL       Creatinine 0.8 mg/dL      Sodium 433 (L) mEq/L      Potassium 3.5 mEq/L      Chloride 92 (L) mEq/L      CO2 28 mEq/L      CALCIUM 9.7 mg/dL      Protein, Total 6.8 g/dL      Albumin 3.7 g/dL      AST (SGOT) 27 U/L  ALT 14 U/L      Alkaline Phosphatase 51 U/L      Bilirubin, Total 0.3 mg/dL      Globulin 3.1 g/dL      Albumin/Globulin Ratio 1.2      Anion Gap 11.0     Hemolysis index [[id number]]  (Abnormal) Collected:10/06/13 1645     Hemolysis Index 24 (H) Updated:10/06/13 1751    GFR [[id number]] Collected:10/06/13 1645     EGFR >60.0 Updated:10/06/13 1751    B-type Natriuretic Peptide [[id number]] Collected:10/06/13 1645    Specimen Information:Blood Updated:10/06/13 1730     B-Natriuretic Peptide 44 pg/mL     CBC and differential [[id number]]  (Abnormal) Collected:10/06/13 1645    Specimen Information:Blood / Blood Updated:10/06/13 1715     WBC 4.15      RBC 4.55      Hgb 13.4 g/dL      Hematocrit 62.9 %      MCV 86.4 fL      MCH 29.5 pg      MCHC 34.1 g/dL      RDW 13 %      Platelets 204      MPV 8.8 (L) fL      Neutrophils 66 %      Lymphocytes Automated 20 %      Monocytes 12 %      Eosinophils Automated 1 %      Basophils Automated 0 %      Immature Granulocyte 0 %      Nucleated RBC 0      Neutrophils Absolute 2.73      Abs Lymph Automated 0.83      Abs Mono Automated 0.51      Abs Eos Automated 0.06      Absolute Baso Automated 0.02      Absolute Immature Granulocyte 0.01     Blood Culture #1 [[id number]] Collected:10/06/13 1645    Specimen Information:Blood / Blood Updated:10/06/13 1645    Narrative:    8ml required    Blood Culture #2 [[id number]] Collected:10/06/13 1645    Specimen Information:Blood / Blood Updated:10/06/13 1645    Narrative:    8ml required          Radiologic Studies -   Radiology Results (24 Hour)     Procedure Component Value Units Date/Time    XR Chest AP Portable [[id number]] Collected:10/06/13 1634    Order Status:Completed  Updated:10/06/13 1638    Narrative:    HISTORY: Shortness  of breath.    .    TECHNIQUE: Single frontal view of the chest was obtained.     PRIORS: 06/13/2013.    FINDINGS: The lung fields are clear. There are no pleural effusions. The  cardiac silhouette and hila are normal. The trachea is midline. The bony  structures are essentially unremarkable.       Impression:     No active lung disease.    Georgana Curio, MD   10/06/2013 4:34 PM      .      Medical Decision Making   I am the first provider for this patient.    I reviewed the vital signs, available nursing notes, past medical history, past surgical history, family history and social history.    Vital Signs-Reviewed the patient's vital signs.     Patient Vitals for the past 12 hrs:   BP Temp Pulse Resp   10/06/13 1814 - - - 20    10/06/13 1809 140/108 mmHg - 92  -  10/06/13 1645 142/96 mmHg - - -   10/06/13 1543 186/86 mmHg 96.3 F (35.7 C) 89  20        Pulse Oximetry Analysis - Normal 99% on 2 L/min NC    Cardiac Monitor:  Rate: 88  Rhythm:  Normal Sinus Rhythm     EKG:  Interpreted by the EP.   Time Interpreted: 1600   Rate: 88   Rhythm: Normal Sinus Rhythm    Interpretation: Normal axis and intervals; Non specific ST changes     Old Medical Records: Nursing notes.     ED Course:   5:25 PM - Checked on pt; sts the albuterol helped. Lung sounds are improved on repeat exam. Updated pt and pt's family members of CXR result.     5:15 PM - Paged Dr. Johnsie Kindred, pt's PCP.     6:22 PM - Discussed pt case w/ Dr. Johnsie Kindred (pt's PCP); sts pt can be discharged home with Levaquin; recommends for her to continue taking her nebulizer tx at home; and can f/u with her in the office next week.     6:30 PM - Updated pt and pt's family member on tx plan. Pt is feeling better and would like to go home.  Discussed test results with pt and her family and counseled on diagnosis, f/u plans w/ her Dr. Marney Doctor next week, and signs and symptoms when to return to ED.  Pt is stable. Will be discharged by Dr. Maisie Fus after Levaquin infusion. Gave abx  and steroid Rxs.    Provider Notes: hx of asthma and CHF, has not taken Lasix in 1 month since used PRN and family did not think she needed it, has been coughing more over last several days, productive, no fever, also c/o generalized weakness x 1 week, diffuse crackles and rhonchi on lung exam. Per pt, family had cardiac cath by Dr. Philip Aspen several months ago and he was unable to place stents because she was "so weak" so is being medically managed.  DDX: PNA, CHF, Asthma exacerbation, Bronchitis  Plan: labs, cxr, nebs, steroids    6:30 PM - Pt is feeling better. Discussed home care with pt, her 2 daughters and her son in law who would all like to take her home. She had recently been on steroids and her sxs seemed to have worsened when she tapered off steroids so will start back on prednisone. Pt is also feeling better with albuterol use but family only giving it to her once per day. Have encouraged them to use it every 4 hours. No focal infiltrate on CXR but will start on course of abx for Levaquin. Believe generalized weakness is due to illness. Labs are unremarkable. Pt's pulse ox has remained above 97% on RA.    Core Measures:   Pt took 81mg  of ASA PTA   ASA given to pt in the ED    Diagnosis     Clinical Impression:   1. Bronchitis    2. Shortness of breath        _______________________________    Attestations:  This note is prepared by Rickey Primus, acting as Scribe for Dr. Ruthe Mannan, MD.     Dr. Ruthe Mannan, MD. The scribe's documentation has been prepared under my direction and personally reviewed by me in its entirety.  I confirm that the note above accurately reflects all work, treatment, procedures, and medical decision making performed by me.    _______________________________  Kathryne Hitch, MD  10/07/13 [phone]

## 2013-10-06 NOTE — Discharge Instructions (Signed)
Please take your antibiotics prescription as instructed until completion. Please take your nebulizer treatment every 4 hours while awake for the next 3 days; and then as needed.  Return to the ER for any new or worsening symptoms.     Bronchitis    You have been diagnosed with bronchitis.    Bronchitis is an irritation of the large breathing tubes. It can be caused by tobacco smoke, air pollution, or an infection. Patients with bronchitis are short of breath and may cough up green or yellow mucous. These symptoms are usually worse at night, when lying flat and in wet weather. Most people with bronchitis do not need antibiotics. If your doctor prescribes antibiotics, fill the prescription and take all the medicine according to the instructions.    Bronchitis is usually treated with medicine to help stop coughing. An inhaler with Albuterol or Ventolin is sometimes used to help with cough. It is best to use the inhaler with a spacer to help the medicine reach your lungs. The doctor can prescribe a spacer.    Bronchitis is usually caused by a virus. Antibiotics do not kill viruses. In fact, antibiotics do not affect viruses in any way. In the past, some doctors prescribed antibiotics for people with bronchitis. We now know that antibiotics are not helpful for most bronchitis patients. Patients who might need antibiotics are those with lung problems that don't go away, like emphysema or COPD.    Your coughing and wheezing might last for 2 or 3 weeks! The symptoms should get better over this time period and not worse.    Your doctor prescribed an Albuterol inhaler. Use it every four hours if you are wheezing, coughing, or short of breath. This will reduce symptoms.    Do not smoke. Research shows that smoking causes heart disease, cancer, and birth defects. Avoiding smoking will help your asthma. If you smoke, ask your doctor for ideas about how to stop.   If you do not smoke, avoid others who do.    YOU SHOULD  SEEK MEDICAL ATTENTION IMMEDIATELY, EITHER HERE OR AT THE NEAREST EMERGENCY DEPARTMENT, IF ANY OF THE FOLLOWING OCCURS:   You wheeze or have trouble breathing.   You have a fever (temperature higher than 101 F or 38.3 C), that won't go away.   You have chest pain.   You vomit or cannot keep liquids down or you feel weak or dizzy.   Your symptoms get worse over the next 2 or 3 days.    Shortness of Breath, Unclear Etiology    You have been seen today for shortness of breath, or difficulty breathing; but we don't know the cause.    This means that after talking about your medical problems, doing a physical exam, and looking at the tests that were done, there is still not a clear explanation as to why you are short of breath. You may need to go see your doctor for another exam or more tests to find out why you are short of breath. Marland Kitchen    CAUTION: Even after a workup, including EKGs, lab tests, x-rays and even CAT Scans (CT scans), it is still possible to have a serious problem that cannot be detected at first.    The physician caring for you feels that the cause of your shortness of breath is not from a life-threatening problem.    Some of the more dangerous medical problems such as a heart attack, blood clot in the lung (pulmonary embolism),  asthma, collapsed lung, heart failure, etc. have been looked at, but are not felt to be the cause of your shortness of breath.    Shortness of breath is a serious symptom and must be handled carefully. It is VERY IMPORTANT that you see your regular doctor and return for re-evaluation or seek medical attention immediately if your symptoms become worse or change.    YOU SHOULD SEEK MEDICAL ATTENTION IMMEDIATELY, EITHER HERE OR AT THE NEAREST EMERGENCY DEPARTMENT, IF ANY OF THE FOLLOWING OCCURS:   Your shortness of breath returns.   You notice that your shortness of breath happens as you walk, go up stairs, or exert yourself.   If you develop chest pain, sweating, or  nausea (feel sick to your stomach).   Rapid heart beat.   You have any weakness, lightheadedness or you get so short of breath that you pass out.   It hurts to breathe.   Your leg swells.   Any other worsening symptoms or problems.   If you are unable to sleep because of shortness of breath, or you wake in the middle of the night short of breath.

## 2013-10-08 LAB — ECG 12-LEAD
Atrial Rate: 88 {beats}/min
P Axis: 71 degrees
P-R Interval: 128 ms
Q-T Interval: 334 ms
QRS Duration: 64 ms
QTC Calculation (Bezet): 404 ms
R Axis: 51 degrees
T Axis: 84 degrees
Ventricular Rate: 88 {beats}/min

## 2014-02-01 DIAGNOSIS — Z961 Presence of intraocular lens: Secondary | ICD-10-CM | POA: Diagnosis not present

## 2014-02-01 DIAGNOSIS — H16149 Punctate keratitis, unspecified eye: Secondary | ICD-10-CM | POA: Diagnosis not present

## 2014-02-01 DIAGNOSIS — H1045 Other chronic allergic conjunctivitis: Secondary | ICD-10-CM | POA: Diagnosis not present

## 2014-02-01 DIAGNOSIS — H00029 Hordeolum internum unspecified eye, unspecified eyelid: Secondary | ICD-10-CM | POA: Diagnosis not present

## 2014-04-11 DIAGNOSIS — I2510 Atherosclerotic heart disease of native coronary artery without angina pectoris: Secondary | ICD-10-CM | POA: Diagnosis not present

## 2014-04-11 DIAGNOSIS — I509 Heart failure, unspecified: Secondary | ICD-10-CM | POA: Diagnosis not present

## 2014-04-11 DIAGNOSIS — E785 Hyperlipidemia, unspecified: Secondary | ICD-10-CM | POA: Diagnosis not present

## 2014-04-11 DIAGNOSIS — I10 Essential (primary) hypertension: Secondary | ICD-10-CM | POA: Diagnosis not present

## 2014-04-16 DIAGNOSIS — I2510 Atherosclerotic heart disease of native coronary artery without angina pectoris: Secondary | ICD-10-CM | POA: Diagnosis not present

## 2014-04-16 DIAGNOSIS — E785 Hyperlipidemia, unspecified: Secondary | ICD-10-CM | POA: Diagnosis not present

## 2014-04-18 DIAGNOSIS — E785 Hyperlipidemia, unspecified: Secondary | ICD-10-CM | POA: Diagnosis not present

## 2014-04-18 DIAGNOSIS — D649 Anemia, unspecified: Secondary | ICD-10-CM | POA: Diagnosis not present

## 2014-04-18 DIAGNOSIS — I10 Essential (primary) hypertension: Secondary | ICD-10-CM | POA: Diagnosis not present

## 2014-04-18 DIAGNOSIS — I2510 Atherosclerotic heart disease of native coronary artery without angina pectoris: Secondary | ICD-10-CM | POA: Diagnosis not present

## 2014-05-17 DIAGNOSIS — I10 Essential (primary) hypertension: Secondary | ICD-10-CM | POA: Diagnosis not present

## 2014-05-17 DIAGNOSIS — I2510 Atherosclerotic heart disease of native coronary artery without angina pectoris: Secondary | ICD-10-CM | POA: Diagnosis not present

## 2014-05-17 DIAGNOSIS — E785 Hyperlipidemia, unspecified: Secondary | ICD-10-CM | POA: Diagnosis not present

## 2014-05-17 DIAGNOSIS — I509 Heart failure, unspecified: Secondary | ICD-10-CM | POA: Diagnosis not present

## 2014-09-12 DIAGNOSIS — I10 Essential (primary) hypertension: Secondary | ICD-10-CM | POA: Diagnosis not present

## 2014-09-12 DIAGNOSIS — D649 Anemia, unspecified: Secondary | ICD-10-CM | POA: Diagnosis not present

## 2014-09-12 DIAGNOSIS — I2510 Atherosclerotic heart disease of native coronary artery without angina pectoris: Secondary | ICD-10-CM | POA: Diagnosis not present

## 2014-09-12 DIAGNOSIS — E785 Hyperlipidemia, unspecified: Secondary | ICD-10-CM | POA: Diagnosis not present

## 2014-09-12 DIAGNOSIS — I509 Heart failure, unspecified: Secondary | ICD-10-CM | POA: Diagnosis not present

## 2014-09-21 DIAGNOSIS — F329 Major depressive disorder, single episode, unspecified: Secondary | ICD-10-CM | POA: Diagnosis not present

## 2014-09-21 DIAGNOSIS — I509 Heart failure, unspecified: Secondary | ICD-10-CM | POA: Diagnosis not present

## 2014-09-21 DIAGNOSIS — E785 Hyperlipidemia, unspecified: Secondary | ICD-10-CM | POA: Diagnosis not present

## 2014-09-21 DIAGNOSIS — I2510 Atherosclerotic heart disease of native coronary artery without angina pectoris: Secondary | ICD-10-CM | POA: Diagnosis not present

## 2014-10-01 DIAGNOSIS — H16223 Keratoconjunctivitis sicca, not specified as Sjogren's, bilateral: Secondary | ICD-10-CM | POA: Diagnosis not present

## 2014-10-24 DIAGNOSIS — I2510 Atherosclerotic heart disease of native coronary artery without angina pectoris: Secondary | ICD-10-CM | POA: Diagnosis not present

## 2014-10-24 DIAGNOSIS — R630 Anorexia: Secondary | ICD-10-CM | POA: Diagnosis not present

## 2014-10-24 DIAGNOSIS — F329 Major depressive disorder, single episode, unspecified: Secondary | ICD-10-CM | POA: Diagnosis not present

## 2014-10-24 DIAGNOSIS — I10 Essential (primary) hypertension: Secondary | ICD-10-CM | POA: Diagnosis not present

## 2014-10-24 DIAGNOSIS — E785 Hyperlipidemia, unspecified: Secondary | ICD-10-CM | POA: Diagnosis not present

## 2014-11-14 DIAGNOSIS — I10 Essential (primary) hypertension: Secondary | ICD-10-CM | POA: Diagnosis not present

## 2014-11-14 DIAGNOSIS — I2510 Atherosclerotic heart disease of native coronary artery without angina pectoris: Secondary | ICD-10-CM | POA: Diagnosis not present

## 2014-11-14 DIAGNOSIS — I509 Heart failure, unspecified: Secondary | ICD-10-CM | POA: Diagnosis not present

## 2014-11-14 DIAGNOSIS — R079 Chest pain, unspecified: Secondary | ICD-10-CM | POA: Diagnosis not present

## 2015-02-25 DIAGNOSIS — E785 Hyperlipidemia, unspecified: Secondary | ICD-10-CM | POA: Diagnosis not present

## 2015-02-25 DIAGNOSIS — I2510 Atherosclerotic heart disease of native coronary artery without angina pectoris: Secondary | ICD-10-CM | POA: Diagnosis not present

## 2015-03-06 DIAGNOSIS — I2510 Atherosclerotic heart disease of native coronary artery without angina pectoris: Secondary | ICD-10-CM | POA: Diagnosis not present

## 2015-03-06 DIAGNOSIS — E785 Hyperlipidemia, unspecified: Secondary | ICD-10-CM | POA: Diagnosis not present

## 2015-03-06 DIAGNOSIS — I10 Essential (primary) hypertension: Secondary | ICD-10-CM | POA: Diagnosis not present

## 2015-03-06 DIAGNOSIS — I509 Heart failure, unspecified: Secondary | ICD-10-CM | POA: Diagnosis not present

## 2015-03-13 DIAGNOSIS — H16223 Keratoconjunctivitis sicca, not specified as Sjogren's, bilateral: Secondary | ICD-10-CM | POA: Diagnosis not present

## 2015-03-13 DIAGNOSIS — H16141 Punctate keratitis, right eye: Secondary | ICD-10-CM | POA: Diagnosis not present

## 2015-04-20 ENCOUNTER — Encounter (HOSPITAL_COMMUNITY): Payer: Self-pay | Admitting: Emergency Medicine

## 2015-04-20 ENCOUNTER — Emergency Department (HOSPITAL_COMMUNITY)
Admission: EM | Admit: 2015-04-20 | Discharge: 2015-04-20 | Disposition: A | Discharge status: Home / Self Care | Payer: Managed Care, Other (non HMO) | Attending: Emergency Medicine | Admitting: Emergency Medicine

## 2015-04-20 DIAGNOSIS — J45909 Unspecified asthma, uncomplicated: Secondary | ICD-10-CM | POA: Diagnosis not present

## 2015-04-20 DIAGNOSIS — H7291 Unspecified perforation of tympanic membrane, right ear: Secondary | ICD-10-CM | POA: Diagnosis not present

## 2015-04-20 DIAGNOSIS — H9221 Otorrhagia, right ear: Secondary | ICD-10-CM | POA: Diagnosis present

## 2015-04-20 DIAGNOSIS — Z8639 Personal history of other endocrine, nutritional and metabolic disease: Secondary | ICD-10-CM | POA: Diagnosis not present

## 2015-04-20 DIAGNOSIS — H6691 Otitis media, unspecified, right ear: Secondary | ICD-10-CM | POA: Diagnosis not present

## 2015-04-20 DIAGNOSIS — I10 Essential (primary) hypertension: Secondary | ICD-10-CM | POA: Diagnosis not present

## 2015-04-20 HISTORY — DX: Unspecified asthma, uncomplicated: J45.909

## 2015-04-20 HISTORY — DX: Essential (primary) hypertension: I10

## 2015-04-20 HISTORY — DX: Pure hypercholesterolemia, unspecified: E78.00

## 2015-04-20 MED ORDER — CIPROFLOXACIN-DEXAMETHASONE 0.3-0.1 % OT SUSP: 4.0000 [drp] | Freq: Two times a day (BID) | OTIC | Status: DC

## 2015-04-20 MED ORDER — AMOXICILLIN-POT CLAVULANATE 875-125 MG PO TABS: 1.0000 | ORAL_TABLET | Freq: Two times a day (BID) | ORAL | Status: DC

## 2015-04-20 NOTE — Discharge Instructions (Signed)
Please return to the ED if bleeding from the ear is heavy and uncontrolled. Otherwise , please follow up with the ENT Doctor. Use the antibiotics as directed  Eardrum Perforation The eardrum is a thin, round tissue inside the ear that separates the ear canal from the middle ear. This is the tissue that detects sound and enables you to hear. The eardrum can be punctured or torn (perforated). Eardrums generally heal without help and with little or no permanent hearing loss. CAUSES   Sudden pressure changes that happen in situations like scuba diving or flying in an airplane.  Foreign objects in the ear.  Inserting a cotton-tipped swab in the ear.  Loud noise.  Trauma to the ear. SYMPTOMS   Hearing loss.  Ear pain.  Ringing in the ears.  Discharge or bleeding from the ear.  Dizziness.  Vomiting.  Facial paralysis. HOME CARE INSTRUCTIONS   Keep your ear dry, as this improves healing. Swimming, diving, and showers are not allowed until healing is complete. While bathing, protect the ear by placing a piece of cotton covered with petroleum jelly in the outer ear canal.  Only take over-the-counter or prescription medicines for pain, discomfort, or fever as directed by your caregiver.  Blow your nose gently. Forceful blowing increases the pressure in the middle ear and may cause further injury or delay healing.  Resume normal activities, such as showering, when the perforation has healed. Your caregiver can let you know when this has occurred.  Talk to your caregiver before flying on an airplane. Air travel is generally allowed with a perforated eardrum.  If your caregiver has given you a follow-up appointment, it is very important to keep that appointment. Failure to keep the appointment could result in a chronic or permanent injury, pain, hearing loss, and disability. SEEK IMMEDIATE MEDICAL CARE IF:   You have bleeding or pus coming from your ear.  You have problems with  balance, dizziness, nausea, or vomiting.  You develop increased pain.  You have a fever. MAKE SURE YOU:   Understand these instructions.  Will watch your condition.  Will get help right away if you are not doing well or get worse. Document Released: 10/23/2000 Document Revised: 01/18/2012 Document Reviewed: 10/25/2008 [facility] Patient Information 2015 [place], [place]. This information is not intended to replace advice given to you by your health care provider. Make sure you discuss any questions you have with your health care provider.  Otitis Media Otitis media is redness, soreness, and inflammation of the middle ear. Otitis media may be caused by allergies or, most commonly, by infection. Often it occurs as a complication of the common cold. SIGNS AND SYMPTOMS Symptoms of otitis media may include:  Earache.  Fever.  Ringing in your ear.  Headache.  Leakage of fluid from the ear. DIAGNOSIS To diagnose otitis media, your health care provider will examine your ear with an otoscope. This is an instrument that allows your health care provider to see into your ear in order to examine your eardrum. Your health care provider also will ask you questions about your symptoms. TREATMENT  Typically, otitis media resolves on its own within 3-5 days. Your health care provider may prescribe medicine to ease your symptoms of pain. If otitis media does not resolve within 5 days or is recurrent, your health care provider may prescribe antibiotic medicines if he or she suspects that a bacterial infection is the cause. HOME CARE INSTRUCTIONS   If you were prescribed an antibiotic medicine,  finish it all even if you start to feel better.  Take medicines only as directed by your health care provider.  Keep all follow-up visits as directed by your health care provider. SEEK MEDICAL CARE IF:  You have otitis media only in one ear, or bleeding from your nose, or both.  You notice a lump on your  neck.  You are not getting better in 3-5 days.  You feel worse instead of better. SEEK IMMEDIATE MEDICAL CARE IF:   You have pain that is not controlled with medicine.  You have swelling, redness, or pain around your ear or stiffness in your neck.  You notice that part of your face is paralyzed.  You notice that the bone behind your ear (mastoid) is tender when you touch it. MAKE SURE YOU:   Understand these instructions.  Will watch your condition.  Will get help right away if you are not doing well or get worse. Document Released: 07/31/2004 Document Revised: 03/12/2014 Document Reviewed: 05/23/2013 [facility] Patient Information 2015 [place], [place]. This information is not intended to replace advice given to you by your health care provider. Make sure you discuss any questions you have with your health care provider.

## 2015-04-20 NOTE — ED Notes (Addendum)
Family reports that pt c/o bleeding from right ear onset today. Bleeding continues, pt is not on blood thinners. Pt was at urgent care and sent here for further eval. Pt is hard of hearing but reports cannot hear anything in that ear.

## 2015-04-20 NOTE — ED Provider Notes (Signed)
CSN: [id number]     Arrival date & time 04/20/15  1035 History   First MD Initiated Contact with Patient 04/20/15 1149     Chief Complaint  Patient presents with  . Ear Drainage     (Consider location/radiation/quality/duration/timing/severity/associated sxs/prior Treatment) HPI    [name] is a(n) 81 y.o. female who presents to the ED with CC of bleeding from the R ear.  History is given by the patient and her daughter. The patient was using the bathroom when she yelled for her daughter. Her daughter to work then and saw blood on the floor. The patient states that she heard a pop in her ear and then began bleeding from the right ear. She denies pain, headache, ear pain, jaw pain. She's had no recent trauma. Eyes to the head or ear and no changes and elevation. Agent is not on any blood thinners. Her only medical issue is labile hypertension Past Medical History  Diagnosis Date  . Asthma   . Hypertension   . High cholesterol    History reviewed. No pertinent past surgical history. No family history on file. History  Substance Use Topics  . Smoking status: Never Smoker   . Smokeless tobacco: Not on file  . Alcohol Use: No   OB History    No data available     Review of Systems   Ten systems reviewed and are negative for acute change, except as noted in the HPI.   Allergies  Review of patient's allergies indicates no known allergies.  Home Medications   Prior to Admission medications   Not on File   BP 168/81 mmHg  Pulse 80  Temp(Src) 98.6 F (37 C) (Oral)  Resp 20  Ht 4\' 10"  (1.473 m)  Wt 93 lb (42.185 kg)  BMI 19.44 kg/m2  SpO2 96% Physical Exam  Constitutional: She is oriented to person, place, and time. She appears well-developed and well-nourished. No distress.  HENT:  Head: Normocephalic and atraumatic.  Left year with retracted tympanic membrane. There is small, what appears to be old perforation in the TM. Right ear with perforated tympanic  membrane. There is blood draining from the ear. Patient has no mastoid tenderness bilaterally. No tenderness of the pinna, no auricular adenopathy.  Eyes: Conjunctivae are normal. No scleral icterus.  Neck: Normal range of motion.  Cardiovascular: Normal rate, regular rhythm and normal heart sounds.  Exam reveals no gallop and no friction rub.   No murmur heard. Pulmonary/Chest: Effort normal and breath sounds normal. No respiratory distress.  Abdominal: Soft. Bowel sounds are normal. She exhibits no distension and no mass. There is no tenderness. There is no guarding.  Neurological: She is alert and oriented to person, place, and time.  Skin: Skin is warm and dry. She is not diaphoretic.    ED Course  Procedures (including critical care time) Labs Review Labs Reviewed - No data to display  Imaging Review No results found.   EKG Interpretation None      MDM   Final diagnoses:  None   Patient with what appears to be otitis media with perforation. Patient seen in shared visit with Dr. Denton Lank Patient will be discharged with Ciprodex and Augmentin. She will follow up with ear, nose and throat. Return to the emergency department for uncontrolled bleeding from the ear. Arthor Captain, PA-C 04/20/15 1635  Cathren Laine, MD 04/21/15 [phone]

## 2015-04-22 DIAGNOSIS — H9190 Unspecified hearing loss, unspecified ear: Secondary | ICD-10-CM | POA: Diagnosis not present

## 2015-04-22 DIAGNOSIS — H918X2 Other specified hearing loss, left ear: Secondary | ICD-10-CM | POA: Diagnosis not present

## 2015-04-22 DIAGNOSIS — H9211 Otorrhea, right ear: Secondary | ICD-10-CM | POA: Diagnosis not present

## 2015-04-22 DIAGNOSIS — H918X1 Other specified hearing loss, right ear: Secondary | ICD-10-CM | POA: Diagnosis not present

## 2015-04-26 ENCOUNTER — Other Ambulatory Visit: Payer: Self-pay | Admitting: Otolaryngology

## 2015-04-26 DIAGNOSIS — H9211 Otorrhea, right ear: Secondary | ICD-10-CM | POA: Diagnosis not present

## 2015-04-26 DIAGNOSIS — H9190 Unspecified hearing loss, unspecified ear: Secondary | ICD-10-CM | POA: Diagnosis not present

## 2015-04-26 DIAGNOSIS — H918X2 Other specified hearing loss, left ear: Secondary | ICD-10-CM | POA: Diagnosis not present

## 2015-04-26 DIAGNOSIS — H9192 Unspecified hearing loss, left ear: Secondary | ICD-10-CM

## 2015-04-29 ENCOUNTER — Ambulatory Visit
Admission: RE | Admit: 2015-04-29 | Discharge: 2015-04-29 | Disposition: A | Discharge status: Home / Self Care | Payer: Medicare Other | Source: Ambulatory Visit | Attending: Otolaryngology | Admitting: Otolaryngology

## 2015-04-29 DIAGNOSIS — H74322 Partial loss of ear ossicles, left ear: Secondary | ICD-10-CM | POA: Diagnosis not present

## 2015-04-29 DIAGNOSIS — H918X2 Other specified hearing loss, left ear: Secondary | ICD-10-CM | POA: Diagnosis not present

## 2015-04-29 DIAGNOSIS — H9192 Unspecified hearing loss, left ear: Secondary | ICD-10-CM

## 2015-04-29 LAB — POCT CREATININE STAT SENSOR (AH)
Creatinine POCT: 0.87 mg/dL (ref ?–1.2)
GFR POCT: >60 mL/min/{1.73_m2} (ref 60–?)
Reference Range: NORMAL
Reference Range: NORMAL

## 2015-04-29 MED ORDER — GADOBUTROL 1 MMOL/ML IV SOLN
5.0000 mL | Freq: Once | INTRAVENOUS | Status: AC | PRN
Start: 2015-04-29 — End: 2015-04-29
  Administered 2015-04-29: 5 mmol via INTRAVENOUS

## 2015-05-15 DIAGNOSIS — H905 Unspecified sensorineural hearing loss: Secondary | ICD-10-CM | POA: Diagnosis not present

## 2015-05-15 DIAGNOSIS — H918X2 Other specified hearing loss, left ear: Secondary | ICD-10-CM | POA: Diagnosis not present

## 2015-05-15 DIAGNOSIS — H9211 Otorrhea, right ear: Secondary | ICD-10-CM | POA: Diagnosis not present

## 2015-06-12 DIAGNOSIS — I10 Essential (primary) hypertension: Secondary | ICD-10-CM | POA: Diagnosis not present

## 2015-06-12 DIAGNOSIS — R079 Chest pain, unspecified: Secondary | ICD-10-CM | POA: Diagnosis not present

## 2015-06-12 DIAGNOSIS — I509 Heart failure, unspecified: Secondary | ICD-10-CM | POA: Diagnosis not present

## 2015-06-12 DIAGNOSIS — I2510 Atherosclerotic heart disease of native coronary artery without angina pectoris: Secondary | ICD-10-CM | POA: Diagnosis not present

## 2015-07-11 DIAGNOSIS — R233 Spontaneous ecchymoses: Secondary | ICD-10-CM | POA: Diagnosis not present

## 2015-09-09 DIAGNOSIS — I2510 Atherosclerotic heart disease of native coronary artery without angina pectoris: Secondary | ICD-10-CM | POA: Diagnosis not present

## 2015-09-09 DIAGNOSIS — R609 Edema, unspecified: Secondary | ICD-10-CM | POA: Diagnosis not present

## 2015-09-09 DIAGNOSIS — Z23 Encounter for immunization: Secondary | ICD-10-CM | POA: Diagnosis not present

## 2015-09-09 DIAGNOSIS — R5383 Other fatigue: Secondary | ICD-10-CM | POA: Diagnosis not present

## 2015-09-09 DIAGNOSIS — E785 Hyperlipidemia, unspecified: Secondary | ICD-10-CM | POA: Diagnosis not present

## 2015-09-09 DIAGNOSIS — E46 Unspecified protein-calorie malnutrition: Secondary | ICD-10-CM | POA: Diagnosis not present

## 2015-09-09 DIAGNOSIS — Z79899 Other long term (current) drug therapy: Secondary | ICD-10-CM | POA: Diagnosis not present

## 2015-09-09 DIAGNOSIS — I10 Essential (primary) hypertension: Secondary | ICD-10-CM | POA: Diagnosis not present

## 2015-09-24 DIAGNOSIS — I10 Essential (primary) hypertension: Secondary | ICD-10-CM | POA: Diagnosis not present

## 2015-09-24 DIAGNOSIS — I2510 Atherosclerotic heart disease of native coronary artery without angina pectoris: Secondary | ICD-10-CM | POA: Diagnosis not present

## 2015-09-24 DIAGNOSIS — E7800 Pure hypercholesterolemia, unspecified: Secondary | ICD-10-CM | POA: Diagnosis not present

## 2015-09-26 DIAGNOSIS — E46 Unspecified protein-calorie malnutrition: Secondary | ICD-10-CM | POA: Diagnosis not present

## 2015-09-26 DIAGNOSIS — Z7902 Long term (current) use of antithrombotics/antiplatelets: Secondary | ICD-10-CM | POA: Diagnosis not present

## 2015-09-26 DIAGNOSIS — I10 Essential (primary) hypertension: Secondary | ICD-10-CM | POA: Diagnosis not present

## 2015-09-26 DIAGNOSIS — Z7982 Long term (current) use of aspirin: Secondary | ICD-10-CM | POA: Diagnosis not present

## 2015-09-26 DIAGNOSIS — I2510 Atherosclerotic heart disease of native coronary artery without angina pectoris: Secondary | ICD-10-CM | POA: Diagnosis not present

## 2015-10-01 DIAGNOSIS — E46 Unspecified protein-calorie malnutrition: Secondary | ICD-10-CM | POA: Diagnosis not present

## 2015-10-01 DIAGNOSIS — Z7982 Long term (current) use of aspirin: Secondary | ICD-10-CM | POA: Diagnosis not present

## 2015-10-01 DIAGNOSIS — I2510 Atherosclerotic heart disease of native coronary artery without angina pectoris: Secondary | ICD-10-CM | POA: Diagnosis not present

## 2015-10-01 DIAGNOSIS — I10 Essential (primary) hypertension: Secondary | ICD-10-CM | POA: Diagnosis not present

## 2015-10-01 DIAGNOSIS — Z7902 Long term (current) use of antithrombotics/antiplatelets: Secondary | ICD-10-CM | POA: Diagnosis not present

## 2015-10-08 DIAGNOSIS — I2510 Atherosclerotic heart disease of native coronary artery without angina pectoris: Secondary | ICD-10-CM | POA: Diagnosis not present

## 2015-10-08 DIAGNOSIS — Z7982 Long term (current) use of aspirin: Secondary | ICD-10-CM | POA: Diagnosis not present

## 2015-10-08 DIAGNOSIS — E46 Unspecified protein-calorie malnutrition: Secondary | ICD-10-CM | POA: Diagnosis not present

## 2015-10-08 DIAGNOSIS — Z7902 Long term (current) use of antithrombotics/antiplatelets: Secondary | ICD-10-CM | POA: Diagnosis not present

## 2015-10-08 DIAGNOSIS — I10 Essential (primary) hypertension: Secondary | ICD-10-CM | POA: Diagnosis not present

## 2015-10-10 DIAGNOSIS — Z7982 Long term (current) use of aspirin: Secondary | ICD-10-CM | POA: Diagnosis not present

## 2015-10-10 DIAGNOSIS — Z7902 Long term (current) use of antithrombotics/antiplatelets: Secondary | ICD-10-CM | POA: Diagnosis not present

## 2015-10-10 DIAGNOSIS — E46 Unspecified protein-calorie malnutrition: Secondary | ICD-10-CM | POA: Diagnosis not present

## 2015-10-10 DIAGNOSIS — I10 Essential (primary) hypertension: Secondary | ICD-10-CM | POA: Diagnosis not present

## 2015-10-10 DIAGNOSIS — I2510 Atherosclerotic heart disease of native coronary artery without angina pectoris: Secondary | ICD-10-CM | POA: Diagnosis not present

## 2015-10-11 DIAGNOSIS — Z7902 Long term (current) use of antithrombotics/antiplatelets: Secondary | ICD-10-CM | POA: Diagnosis not present

## 2015-10-11 DIAGNOSIS — Z7982 Long term (current) use of aspirin: Secondary | ICD-10-CM | POA: Diagnosis not present

## 2015-10-11 DIAGNOSIS — E46 Unspecified protein-calorie malnutrition: Secondary | ICD-10-CM | POA: Diagnosis not present

## 2015-10-11 DIAGNOSIS — I10 Essential (primary) hypertension: Secondary | ICD-10-CM | POA: Diagnosis not present

## 2015-10-11 DIAGNOSIS — I2510 Atherosclerotic heart disease of native coronary artery without angina pectoris: Secondary | ICD-10-CM | POA: Diagnosis not present

## 2015-10-15 DIAGNOSIS — I10 Essential (primary) hypertension: Secondary | ICD-10-CM | POA: Diagnosis not present

## 2015-10-15 DIAGNOSIS — Z7902 Long term (current) use of antithrombotics/antiplatelets: Secondary | ICD-10-CM | POA: Diagnosis not present

## 2015-10-15 DIAGNOSIS — E46 Unspecified protein-calorie malnutrition: Secondary | ICD-10-CM | POA: Diagnosis not present

## 2015-10-15 DIAGNOSIS — Z7982 Long term (current) use of aspirin: Secondary | ICD-10-CM | POA: Diagnosis not present

## 2015-10-15 DIAGNOSIS — I2510 Atherosclerotic heart disease of native coronary artery without angina pectoris: Secondary | ICD-10-CM | POA: Diagnosis not present

## 2015-10-17 DIAGNOSIS — Z7982 Long term (current) use of aspirin: Secondary | ICD-10-CM | POA: Diagnosis not present

## 2015-10-17 DIAGNOSIS — I2510 Atherosclerotic heart disease of native coronary artery without angina pectoris: Secondary | ICD-10-CM | POA: Diagnosis not present

## 2015-10-17 DIAGNOSIS — I10 Essential (primary) hypertension: Secondary | ICD-10-CM | POA: Diagnosis not present

## 2015-10-17 DIAGNOSIS — Z7902 Long term (current) use of antithrombotics/antiplatelets: Secondary | ICD-10-CM | POA: Diagnosis not present

## 2015-10-17 DIAGNOSIS — E46 Unspecified protein-calorie malnutrition: Secondary | ICD-10-CM | POA: Diagnosis not present

## 2015-10-22 DIAGNOSIS — I2510 Atherosclerotic heart disease of native coronary artery without angina pectoris: Secondary | ICD-10-CM | POA: Diagnosis not present

## 2015-10-22 DIAGNOSIS — E46 Unspecified protein-calorie malnutrition: Secondary | ICD-10-CM | POA: Diagnosis not present

## 2015-10-22 DIAGNOSIS — I10 Essential (primary) hypertension: Secondary | ICD-10-CM | POA: Diagnosis not present

## 2015-10-22 DIAGNOSIS — Z7902 Long term (current) use of antithrombotics/antiplatelets: Secondary | ICD-10-CM | POA: Diagnosis not present

## 2015-10-22 DIAGNOSIS — Z7982 Long term (current) use of aspirin: Secondary | ICD-10-CM | POA: Diagnosis not present

## 2015-10-24 DIAGNOSIS — I10 Essential (primary) hypertension: Secondary | ICD-10-CM | POA: Diagnosis not present

## 2015-10-24 DIAGNOSIS — Z7902 Long term (current) use of antithrombotics/antiplatelets: Secondary | ICD-10-CM | POA: Diagnosis not present

## 2015-10-24 DIAGNOSIS — I2510 Atherosclerotic heart disease of native coronary artery without angina pectoris: Secondary | ICD-10-CM | POA: Diagnosis not present

## 2015-10-24 DIAGNOSIS — E46 Unspecified protein-calorie malnutrition: Secondary | ICD-10-CM | POA: Diagnosis not present

## 2015-10-24 DIAGNOSIS — Z7982 Long term (current) use of aspirin: Secondary | ICD-10-CM | POA: Diagnosis not present

## 2015-10-29 DIAGNOSIS — Z7982 Long term (current) use of aspirin: Secondary | ICD-10-CM | POA: Diagnosis not present

## 2015-10-29 DIAGNOSIS — I2510 Atherosclerotic heart disease of native coronary artery without angina pectoris: Secondary | ICD-10-CM | POA: Diagnosis not present

## 2015-10-29 DIAGNOSIS — I10 Essential (primary) hypertension: Secondary | ICD-10-CM | POA: Diagnosis not present

## 2015-10-29 DIAGNOSIS — E46 Unspecified protein-calorie malnutrition: Secondary | ICD-10-CM | POA: Diagnosis not present

## 2015-10-29 DIAGNOSIS — Z7902 Long term (current) use of antithrombotics/antiplatelets: Secondary | ICD-10-CM | POA: Diagnosis not present

## 2015-10-31 DIAGNOSIS — Z7902 Long term (current) use of antithrombotics/antiplatelets: Secondary | ICD-10-CM | POA: Diagnosis not present

## 2015-10-31 DIAGNOSIS — I2510 Atherosclerotic heart disease of native coronary artery without angina pectoris: Secondary | ICD-10-CM | POA: Diagnosis not present

## 2015-10-31 DIAGNOSIS — Z7982 Long term (current) use of aspirin: Secondary | ICD-10-CM | POA: Diagnosis not present

## 2015-10-31 DIAGNOSIS — E46 Unspecified protein-calorie malnutrition: Secondary | ICD-10-CM | POA: Diagnosis not present

## 2015-10-31 DIAGNOSIS — I10 Essential (primary) hypertension: Secondary | ICD-10-CM | POA: Diagnosis not present

## 2016-02-07 DIAGNOSIS — E785 Hyperlipidemia, unspecified: Secondary | ICD-10-CM | POA: Diagnosis not present

## 2016-02-07 DIAGNOSIS — Z79899 Other long term (current) drug therapy: Secondary | ICD-10-CM | POA: Diagnosis not present

## 2016-02-07 DIAGNOSIS — K5901 Slow transit constipation: Secondary | ICD-10-CM | POA: Diagnosis not present

## 2016-02-07 DIAGNOSIS — I2510 Atherosclerotic heart disease of native coronary artery without angina pectoris: Secondary | ICD-10-CM | POA: Diagnosis not present

## 2016-02-07 DIAGNOSIS — I10 Essential (primary) hypertension: Secondary | ICD-10-CM | POA: Diagnosis not present

## 2016-02-07 DIAGNOSIS — E46 Unspecified protein-calorie malnutrition: Secondary | ICD-10-CM | POA: Diagnosis not present

## 2016-03-05 ENCOUNTER — Encounter: Payer: Self-pay | Admitting: Podiatry

## 2016-03-05 ENCOUNTER — Ambulatory Visit (INDEPENDENT_AMBULATORY_CARE_PROVIDER_SITE_OTHER): Payer: Medicare Other | Admitting: Podiatry

## 2016-03-05 DIAGNOSIS — B351 Tinea unguium: Secondary | ICD-10-CM

## 2016-03-05 DIAGNOSIS — M79676 Pain in unspecified toe(s): Secondary | ICD-10-CM | POA: Diagnosis not present

## 2016-03-05 DIAGNOSIS — Q828 Other specified congenital malformations of skin: Secondary | ICD-10-CM

## 2016-03-05 NOTE — Progress Notes (Signed)
   Subjective:    Patient ID: [name], female    DOB: 24-Feb-1924, 82 y.o.   MRN: [id number]  HPI this patient presents the office with chief complaint of long thick painful nails. She says through her daughter that she has moved from [place] down to [place]. She states it has been a longer time between visits to have her nails done. She says she did have a podiatrist who treated her nails in [place]. She states her nails are painful nail walking and wearing her shoes. She was referred to the office by her medical doctor for an evaluation and treatment. Finally, she has a painful skin lesion noted on the heel of the right foot    Review of Systems  All other systems reviewed and are negative.      Objective:   Physical Exam GENERAL APPEARANCE: Alert, conversant. Appropriately groomed. No acute distress.  VASCULAR: Pedal pulses are not   palpable at  [facility] and PT bilateral.  Capillary refill time is immediate to all digits,  Normal temperature gradient.    NEUROLOGIC: sensation is normal to 5.07 monofilament at 5/5 sites bilateral.  Light touch is intact bilateral, Muscle strength normal.  MUSCULOSKELETAL: acceptable muscle strength, tone and stability bilateral.  Intrinsic muscluature intact bilateral.  Rectus appearance of foot and digits noted bilateral.   DERMATOLOGIC: skin color, texture, and turgor are within normal limits.  No preulcerative lesions or ulcers  are seen, no interdigital maceration noted.  No open lesions present. .Porokeratosis right heel. NAILS  Thick diifgured discolored nails both feet.        Assessment & Plan:  Onychomycosis  Porokeratosis right  IE  Debride nails B/L.  Debride porokeratosis.  RTC 10 weeks.   Helane GuntherGregory Marielouise Amey DPM

## 2016-03-18 ENCOUNTER — Institutional Professional Consult (permissible substitution): Payer: Managed Care, Other (non HMO) | Admitting: Pulmonary Disease

## 2016-03-23 DIAGNOSIS — I2510 Atherosclerotic heart disease of native coronary artery without angina pectoris: Secondary | ICD-10-CM | POA: Diagnosis not present

## 2016-03-23 DIAGNOSIS — I10 Essential (primary) hypertension: Secondary | ICD-10-CM | POA: Diagnosis not present

## 2016-03-23 DIAGNOSIS — R600 Localized edema: Secondary | ICD-10-CM | POA: Diagnosis not present

## 2016-04-08 ENCOUNTER — Institutional Professional Consult (permissible substitution): Payer: Managed Care, Other (non HMO) | Admitting: Pulmonary Disease

## 2016-04-08 DIAGNOSIS — I10 Essential (primary) hypertension: Secondary | ICD-10-CM | POA: Diagnosis not present

## 2016-04-10 ENCOUNTER — Encounter (INDEPENDENT_AMBULATORY_CARE_PROVIDER_SITE_OTHER): Payer: Self-pay

## 2016-04-10 ENCOUNTER — Ambulatory Visit (INDEPENDENT_AMBULATORY_CARE_PROVIDER_SITE_OTHER)
Admission: RE | Admit: 2016-04-10 | Discharge: 2016-04-10 | Disposition: A | Discharge status: Home / Self Care | Payer: Managed Care, Other (non HMO) | Source: Ambulatory Visit | Attending: Pulmonary Disease | Admitting: Pulmonary Disease

## 2016-04-10 ENCOUNTER — Ambulatory Visit (INDEPENDENT_AMBULATORY_CARE_PROVIDER_SITE_OTHER): Payer: Medicare Other | Admitting: Pulmonary Disease

## 2016-04-10 ENCOUNTER — Encounter: Payer: Self-pay | Admitting: Pulmonary Disease

## 2016-04-10 VITALS — BP 112/64 | HR 62 | Ht <= 58 in | Wt 98.4 lb

## 2016-04-10 DIAGNOSIS — J45909 Unspecified asthma, uncomplicated: Secondary | ICD-10-CM

## 2016-04-10 DIAGNOSIS — R0602 Shortness of breath: Secondary | ICD-10-CM | POA: Insufficient documentation

## 2016-04-10 DIAGNOSIS — R0600 Dyspnea, unspecified: Secondary | ICD-10-CM | POA: Diagnosis not present

## 2016-04-10 DIAGNOSIS — J449 Chronic obstructive pulmonary disease, unspecified: Secondary | ICD-10-CM | POA: Insufficient documentation

## 2016-04-10 MED ORDER — ALBUTEROL SULFATE (2.5 MG/3ML) 0.083% IN NEBU: 2.5000 mg | INHALATION_SOLUTION | Freq: Four times a day (QID) | RESPIRATORY_TRACT | Status: DC | PRN

## 2016-04-10 NOTE — Assessment & Plan Note (Signed)
She is followed by Dr. Yates DecampJay Ganji for her coronary artery disease. I wonder if she has congestive heart failure, I have no echocardiogram to base this on at this time. However, on physical exam she has many findings consistent with CHF (elevated JVD, ankle edema, crackles in the bases of her lungs).  Plan: Chest x-ray Request records from Dr. Verl DickerGanji's office.

## 2016-04-10 NOTE — Assessment & Plan Note (Signed)
She has a history of asthma which apparently was a problem for her growing up in [place]. This was treated with homeopathic treatments over the years until she moved to the [place]. Apparently he was never a problem from the time she moved here in the 1990s until about 5 years ago.  I am not certain as to the severity of her asthma. Fortunately, she does not have recurrent exacerbations of asthma. I think some of her symptoms if not all could be explained by heart failure as she has crackles on exam, elevated JVD, and leg swelling.  That said, she seems to be well controlled on Advair for now.  Plan: Continue to treat as if this is asthma Simple spirometry today Continue Advair Use albuterol as needed for chest tightness, wheezing, or shortness of breath Chest x-ray today Request records from her pulmonologist in [place]

## 2016-04-10 NOTE — Patient Instructions (Signed)
We will call you with the results of the chest x-ray Take the Advair twice a day as you're doing We will refill the albuterol, take it as needed for chest tightness, shortness of breath, or wheezing We will request records from Dr. Jacinto HalimGanji as well as from the pulmonologist in [place] once we hear from you We will see you back in 3 months or sooner if needed

## 2016-04-10 NOTE — Progress Notes (Signed)
Subjective:    Patient ID: [name], female    DOB: [birth year], 82 y.o.   MRN: [id number]  HPI Chief Complaint  Patient presents with  . Advice Only    Referred for asthma by [facility].  Pt's daughter states pt has been wheezing X1 month.     Ms. [name] is here today to see me for asthma.  Her daughter [name] provides most of the history today because [name] ("[name]") has dementia.  Asthma: > had this when she was living in [place] > grew up in [place] and was initially treated with homeopathy > after moving here she was initially well, but she has developed dyspnea and wheezing about 5 years ago > she has had a few hospital visits for dyspnea associated with a high blood pressure > she likes using albuterol because it helps her breathe well > never admitted to the hospital for a breathing problem > Takes Advair bid for three years now > never smoked  Dyspnea: > she has orthopnea and has to prop her head up on three pillow > she had PFT that said her lung function was "50% not working" > the dyspnea has been stable since she moved to [place] in October > relieved by albuterol which was she has used 5 times in the last ten days > worsened with dust, strong fumes, smoke > dyspnea worst in winter time, does good in summer time > associated with leg swelling    Gets URI's throughout the year > typically happens with the change of season or weather > she has been treated with antibiotics before for URI, but not frequently  Has CAD: > this has been there for two years > Sees Luretha MurphyJay Ghangi who says that she has an irregular heart rhythm  Past Medical History  Diagnosis Date  . Asthma   . Hypertension   . High cholesterol      No family history on file.   Social History   Social History  . Marital Status: Widowed    Spouse Name: N/A  . Number of Children: N/A  . Years of Education: N/A   Occupational History  . Not on file.   Social History Main Topics  .  Smoking status: Never Smoker   . Smokeless tobacco: Never Used  . Alcohol Use: No  . Drug Use: No  . Sexual Activity: Not on file   Other Topics Concern  . Not on file   Social History Narrative     Allergies  Allergen Reactions  . Penicillins      Outpatient Prescriptions Prior to Visit  Medication Sig Dispense Refill  . amoxicillin-clavulanate (AUGMENTIN) 875-125 MG per tablet Take 1 tablet by mouth every 12 (twelve) hours. 14 tablet 0  . ciprofloxacin-dexamethasone (CIPRODEX) otic suspension Place 4 drops into the right ear 2 (two) times daily. 7.5 mL 0   No facility-administered medications prior to visit.       Review of Systems  Constitutional: Negative for fever and unexpected weight change.  HENT: Negative for congestion, dental problem, ear pain, nosebleeds, postnasal drip, rhinorrhea, sinus pressure, sneezing, sore throat and trouble swallowing.   Eyes: Negative for redness and itching.  Respiratory: Negative for cough, chest tightness, shortness of breath and wheezing.   Cardiovascular: Negative for palpitations and leg swelling.  Gastrointestinal: Negative for nausea and vomiting.  Genitourinary: Negative for dysuria.  Musculoskeletal: Negative for joint swelling.  Skin: Negative for rash.  Neurological: Negative for headaches.  Hematological:  Does not bruise/bleed easily.  Psychiatric/Behavioral: Negative for dysphoric mood. The patient is not nervous/anxious.        Objective:   Physical Exam Filed Vitals:   04/10/16 1612  BP: 112/64  Pulse: 62  Height:  (1.473 m)  Weight: 98 lb 6.4 oz (44.634 kg)  SpO2: 96%   RA  Gen: elderly, but well appearing, no acute distress HENT: NCAT, OP clear, neck supple without masses Eyes: PERRL, EOMi Lymph: no cervical lymphadenopathy PULM: Crackles in bases bilaterally, good air movement CV: RRR, no mgr, + JVD, trace edema GI: BS+, soft, nontender, no hsm Derm: no rash or skin breakdown MSK: normal  bulk and tone Neuro: A&Ox4, CN II-XII intact, strength 5/5 in all 4 extremities Psyche: normal mood and affect    [facility] physician records reviewed were she was seen by her primary care physician on 09/09/2015. She has coronary artery disease, hyperlipidemia, and takes Advair. He says that she's never smoked.  Hemoglobin level in October 2016 was 13.5 g/dL  40/98/1191 simple spirometry testing shows clear airflow obstruction but the patient had difficulty with the test and the overall quality does not meet American thoracic Society standards for reproducibility.     Assessment & Plan:  Chronic obstructive airway disease with asthma (HCC) She has a history of asthma which apparently was a problem for her growing up in [place]. This was treated with homeopathic treatments over the years until she moved to the [place]. Apparently he was never a problem from the time she moved here in the 1990s until about 5 years ago.  I am not certain as to the severity of her asthma. Fortunately, she does not have recurrent exacerbations of asthma. I think some of her symptoms if not all could be explained by heart failure as she has crackles on exam, elevated JVD, and leg swelling.  That said, she seems to be well controlled on Advair for now.  Plan: Continue to treat as if this is asthma Simple spirometry today Continue Advair Use albuterol as needed for chest tightness, wheezing, or shortness of breath Chest x-ray today Request records from her pulmonologist in [place]  Dyspnea She is followed by Dr. Yates Decamp for her coronary artery disease. I wonder if she has congestive heart failure, I have no echocardiogram to base this on at this time. However, on physical exam she has many findings consistent with CHF (elevated JVD, ankle edema, crackles in the bases of her lungs).  Plan: Chest x-ray Request records from Dr. Verl Dicker office.     Current outpatient prescriptions:  .  albuterol  (PROVENTIL) (2.5 MG/3ML) 0.083% nebulizer solution, Take 3 mLs (2.5 mg total) by nebulization every 6 (six) hours as needed for wheezing or shortness of breath., Disp: 360 mL, Rfl: 5 .  aspirin 81 MG tablet, Take 81 mg by mouth daily., Disp: , Rfl:  .  clopidogrel (PLAVIX) 75 MG tablet, Take 75 mg by mouth every other day., Disp: , Rfl:  .  furosemide (LASIX) 20 MG tablet, Take 20 mg by mouth daily as needed., Disp: , Rfl:  .  metoprolol succinate (TOPROL-XL) 25 MG 24 hr tablet, Take 25 mg by mouth daily., Disp: , Rfl:  .  simvastatin (ZOCOR) 20 MG tablet, Take 20 mg by mouth daily., Disp: , Rfl:  .  valsartan (DIOVAN) 80 MG tablet, Take 80 mg by mouth daily., Disp: , Rfl:

## 2016-04-13 ENCOUNTER — Telehealth: Payer: Self-pay | Admitting: Pulmonary Disease

## 2016-04-13 DIAGNOSIS — J449 Chronic obstructive pulmonary disease, unspecified: Secondary | ICD-10-CM

## 2016-04-13 NOTE — Telephone Encounter (Signed)
Spoke with patient's daughter-she is aware that [employer] is her mother's DME that will supply her mother's nebulizer supplies. [facility] and nothing more needed at this time.

## 2016-04-14 NOTE — Progress Notes (Signed)
Quick Note:  ATC, NA and VM full, WCB ______

## 2016-04-15 DIAGNOSIS — I5030 Unspecified diastolic (congestive) heart failure: Secondary | ICD-10-CM | POA: Diagnosis not present

## 2016-04-20 ENCOUNTER — Telehealth: Payer: Self-pay | Admitting: Pulmonary Disease

## 2016-04-20 MED ORDER — ALBUTEROL SULFATE (2.5 MG/3ML) 0.083% IN NEBU: 2.5000 mg | INHALATION_SOLUTION | Freq: Four times a day (QID) | RESPIRATORY_TRACT | Status: AC | PRN

## 2016-04-20 NOTE — Telephone Encounter (Signed)
Rx for albuterol nebs re-sent with dx code included

## 2016-04-22 DIAGNOSIS — I2510 Atherosclerotic heart disease of native coronary artery without angina pectoris: Secondary | ICD-10-CM | POA: Diagnosis not present

## 2016-04-22 DIAGNOSIS — E871 Hypo-osmolality and hyponatremia: Secondary | ICD-10-CM | POA: Diagnosis not present

## 2016-04-22 DIAGNOSIS — I10 Essential (primary) hypertension: Secondary | ICD-10-CM | POA: Diagnosis not present

## 2016-04-22 DIAGNOSIS — R0602 Shortness of breath: Secondary | ICD-10-CM | POA: Diagnosis not present

## 2016-04-22 DIAGNOSIS — R600 Localized edema: Secondary | ICD-10-CM | POA: Diagnosis not present

## 2016-05-14 ENCOUNTER — Encounter: Payer: Self-pay | Admitting: Podiatry

## 2016-05-14 ENCOUNTER — Ambulatory Visit: Payer: Medicare Other | Admitting: Podiatry

## 2016-05-14 ENCOUNTER — Ambulatory Visit (INDEPENDENT_AMBULATORY_CARE_PROVIDER_SITE_OTHER): Payer: Medicare Other | Admitting: Podiatry

## 2016-05-14 DIAGNOSIS — B351 Tinea unguium: Secondary | ICD-10-CM | POA: Diagnosis not present

## 2016-05-14 DIAGNOSIS — M79676 Pain in unspecified toe(s): Secondary | ICD-10-CM

## 2016-05-14 MED ORDER — AMMONIUM LACTATE 12 % EX CREA: TOPICAL_CREAM | CUTANEOUS | Status: DC | PRN

## 2016-05-14 NOTE — Progress Notes (Signed)
Patient ID: Angelica ColaDevyani [name], female   DOB: Apr 04, 1924, 82 y.o.   MRN: [id number] Complaint:  Visit Type: Patient returns to my office for continued preventative foot care services. Complaint: Patient states" my nails have grown long and thick and become painful to walk and wear shoes" . The patient presents for preventative foot care services. No changes to ROS  Podiatric Exam: Vascular: dorsalis pedis and posterior tibial pulses are not  palpable bilateral. Capillary return is immediate. Temperature gradient is WNL. Skin turgor WNL  Sensorium: Normal Semmes Weinstein monofilament test. Normal tactile sensation bilaterally. Nail Exam: Pt has thick disfigured discolored nails with subungual debris noted bilateral entire nail hallux through fifth toenails Ulcer Exam: There is no evidence of ulcer or pre-ulcerative changes or infection. Orthopedic Exam: Muscle tone and strength are WNL. No limitations in general ROM. No crepitus or effusions noted. Foot type and digits show no abnormalities. Bony prominences are unremarkable. Skin: No Porokeratosis. No infection or ulcers  Diagnosis:  Onychomycosis, , Pain in right toe, pain in left toes  Treatment & Plan Procedures and Treatment: Consent by patient was obtained for treatment procedures. The patient understood the discussion of treatment and procedures well. All questions were answered thoroughly reviewed. Debridement of mycotic and hypertrophic toenails, 1 through 5 bilateral and clearing of subungual debris. No ulceration, no infection noted. Prescribe lac-hydrin.  Prescribe Formula 3.  Return Visit-Office Procedure: Patient instructed to return to the office for a follow up visit 3 months for continued evaluation and treatment.    Helane GuntherGregory Crislyn Willbanks DPM

## 2016-05-19 ENCOUNTER — Telehealth: Payer: Self-pay | Admitting: Pulmonary Disease

## 2016-05-19 NOTE — Telephone Encounter (Signed)
Note    Staff [facility] - I left a triage message for this one. Pt received a neb from [employer] 06/19/2013 so the pt will need to get supplies from [employer]. We can provide a nebulizer to them at a private pay price.  I sent this order to [employer].    ---  Called spoke with pt daughter. Made her aware we have sent the request for neb supplies to [employer] back in June. She reports she did not know this and will contact [employer] to get this ordered. Nothing further needed

## 2016-06-01 ENCOUNTER — Encounter: Payer: Self-pay | Admitting: Pulmonary Disease

## 2016-07-17 ENCOUNTER — Ambulatory Visit: Payer: Managed Care, Other (non HMO) | Admitting: Pulmonary Disease

## 2016-08-10 DIAGNOSIS — J4520 Mild intermittent asthma, uncomplicated: Secondary | ICD-10-CM | POA: Diagnosis not present

## 2016-08-10 DIAGNOSIS — I10 Essential (primary) hypertension: Secondary | ICD-10-CM | POA: Diagnosis not present

## 2016-08-10 DIAGNOSIS — I2510 Atherosclerotic heart disease of native coronary artery without angina pectoris: Secondary | ICD-10-CM | POA: Diagnosis not present

## 2016-08-10 DIAGNOSIS — Z23 Encounter for immunization: Secondary | ICD-10-CM | POA: Diagnosis not present

## 2016-08-10 DIAGNOSIS — E46 Unspecified protein-calorie malnutrition: Secondary | ICD-10-CM | POA: Diagnosis not present

## 2016-08-13 ENCOUNTER — Encounter: Payer: Self-pay | Admitting: Podiatry

## 2016-08-13 ENCOUNTER — Ambulatory Visit (INDEPENDENT_AMBULATORY_CARE_PROVIDER_SITE_OTHER): Payer: Medicare Other | Admitting: Podiatry

## 2016-08-13 VITALS — BP 131/73 | HR 74 | Resp 14

## 2016-08-13 DIAGNOSIS — B351 Tinea unguium: Secondary | ICD-10-CM

## 2016-08-13 DIAGNOSIS — Q828 Other specified congenital malformations of skin: Secondary | ICD-10-CM | POA: Diagnosis not present

## 2016-08-13 DIAGNOSIS — M79676 Pain in unspecified toe(s): Secondary | ICD-10-CM

## 2016-08-13 NOTE — Progress Notes (Signed)
Patient ID: Angelica ColaDevyani [name], female   DOB: April 27, 1924, 82 y.o.   MRN: [id number] Complaint:  Visit Type: Patient returns to my office for continued preventative foot care services. Complaint: Patient states" my nails have grown long and thick and become painful to walk and wear shoes" . The patient presents for preventative foot care services. No changes to ROS.  Painful callus right heel.  Podiatric Exam: Vascular: dorsalis pedis and posterior tibial pulses are not  palpable bilateral. Capillary return is immediate. Temperature gradient is WNL. Skin turgor WNL  Sensorium: Normal Semmes Weinstein monofilament test. Normal tactile sensation bilaterally. Nail Exam: Pt has thick disfigured discolored nails with subungual debris noted bilateral entire nail hallux through fifth toenails Ulcer Exam: There is no evidence of ulcer or pre-ulcerative changes or infection. Orthopedic Exam: Muscle tone and strength are WNL. No limitations in general ROM. No crepitus or effusions noted. Foot type and digits show no abnormalities. Bony prominences are unremarkable. Skin:  Porokeratosis right heel. No infection or ulcers  Diagnosis:  Onychomycosis, , Pain in right toe, pain in left toes.  Porokeratosis right heel  Treatment & Plan Procedures and Treatment: Consent by patient was obtained for treatment procedures. The patient understood the discussion of treatment and procedures well. All questions were answered thoroughly reviewed. Debridement of mycotic and hypertrophic toenails, 1 through 5 bilateral and clearing of subungual debris. No ulceration, no infection noted.Debride porokeratosis right heel. Return Visit-Office Procedure: Patient instructed to return to the office for a follow up visit 3 months for continued evaluation and treatment.    Helane GuntherGregory Sydna Brodowski DPM

## 2016-08-21 DIAGNOSIS — R05 Cough: Secondary | ICD-10-CM | POA: Diagnosis not present

## 2016-08-21 DIAGNOSIS — J209 Acute bronchitis, unspecified: Secondary | ICD-10-CM | POA: Diagnosis not present

## 2016-08-24 ENCOUNTER — Encounter (HOSPITAL_COMMUNITY): Payer: Self-pay | Admitting: Emergency Medicine

## 2016-08-24 ENCOUNTER — Inpatient Hospital Stay (HOSPITAL_COMMUNITY): Payer: Medicare Other

## 2016-08-24 ENCOUNTER — Emergency Department (HOSPITAL_COMMUNITY): Payer: Medicare Other

## 2016-08-24 ENCOUNTER — Inpatient Hospital Stay (HOSPITAL_COMMUNITY)
Admission: EM | Admit: 2016-08-24 | Discharge: 2016-09-01 | DRG: 871 | Disposition: A | Discharge status: Skilled Nursing Facility | Payer: Medicare Other | Attending: Internal Medicine | Admitting: Internal Medicine

## 2016-08-24 DIAGNOSIS — I480 Paroxysmal atrial fibrillation: Secondary | ICD-10-CM | POA: Diagnosis not present

## 2016-08-24 DIAGNOSIS — I25119 Atherosclerotic heart disease of native coronary artery with unspecified angina pectoris: Secondary | ICD-10-CM | POA: Diagnosis present

## 2016-08-24 DIAGNOSIS — F0390 Unspecified dementia without behavioral disturbance: Secondary | ICD-10-CM | POA: Diagnosis present

## 2016-08-24 DIAGNOSIS — I248 Other forms of acute ischemic heart disease: Secondary | ICD-10-CM | POA: Diagnosis present

## 2016-08-24 DIAGNOSIS — G9341 Metabolic encephalopathy: Secondary | ICD-10-CM | POA: Diagnosis present

## 2016-08-24 DIAGNOSIS — R0602 Shortness of breath: Secondary | ICD-10-CM

## 2016-08-24 DIAGNOSIS — R0600 Dyspnea, unspecified: Secondary | ICD-10-CM

## 2016-08-24 DIAGNOSIS — J189 Pneumonia, unspecified organism: Secondary | ICD-10-CM | POA: Diagnosis present

## 2016-08-24 DIAGNOSIS — Z7982 Long term (current) use of aspirin: Secondary | ICD-10-CM | POA: Diagnosis not present

## 2016-08-24 DIAGNOSIS — R778 Other specified abnormalities of plasma proteins: Secondary | ICD-10-CM | POA: Diagnosis present

## 2016-08-24 DIAGNOSIS — J4489 Other specified chronic obstructive pulmonary disease: Secondary | ICD-10-CM | POA: Diagnosis present

## 2016-08-24 DIAGNOSIS — T380X5A Adverse effect of glucocorticoids and synthetic analogues, initial encounter: Secondary | ICD-10-CM | POA: Diagnosis present

## 2016-08-24 DIAGNOSIS — R0902 Hypoxemia: Secondary | ICD-10-CM

## 2016-08-24 DIAGNOSIS — Z880 Allergy status to penicillin: Secondary | ICD-10-CM

## 2016-08-24 DIAGNOSIS — E876 Hypokalemia: Secondary | ICD-10-CM | POA: Diagnosis present

## 2016-08-24 DIAGNOSIS — R0789 Other chest pain: Secondary | ICD-10-CM | POA: Diagnosis not present

## 2016-08-24 DIAGNOSIS — E785 Hyperlipidemia, unspecified: Secondary | ICD-10-CM | POA: Diagnosis not present

## 2016-08-24 DIAGNOSIS — Z8249 Family history of ischemic heart disease and other diseases of the circulatory system: Secondary | ICD-10-CM

## 2016-08-24 DIAGNOSIS — R262 Difficulty in walking, not elsewhere classified: Secondary | ICD-10-CM | POA: Diagnosis not present

## 2016-08-24 DIAGNOSIS — J449 Chronic obstructive pulmonary disease, unspecified: Secondary | ICD-10-CM | POA: Diagnosis not present

## 2016-08-24 DIAGNOSIS — I209 Angina pectoris, unspecified: Secondary | ICD-10-CM | POA: Diagnosis not present

## 2016-08-24 DIAGNOSIS — A419 Sepsis, unspecified organism: Principal | ICD-10-CM | POA: Diagnosis present

## 2016-08-24 DIAGNOSIS — R079 Chest pain, unspecified: Secondary | ICD-10-CM | POA: Diagnosis present

## 2016-08-24 DIAGNOSIS — Z7902 Long term (current) use of antithrombotics/antiplatelets: Secondary | ICD-10-CM

## 2016-08-24 DIAGNOSIS — J440 Chronic obstructive pulmonary disease with acute lower respiratory infection: Secondary | ICD-10-CM | POA: Diagnosis present

## 2016-08-24 DIAGNOSIS — J441 Chronic obstructive pulmonary disease with (acute) exacerbation: Secondary | ICD-10-CM | POA: Diagnosis present

## 2016-08-24 DIAGNOSIS — I4891 Unspecified atrial fibrillation: Secondary | ICD-10-CM | POA: Diagnosis present

## 2016-08-24 DIAGNOSIS — R739 Hyperglycemia, unspecified: Secondary | ICD-10-CM | POA: Diagnosis not present

## 2016-08-24 DIAGNOSIS — J9601 Acute respiratory failure with hypoxia: Secondary | ICD-10-CM | POA: Diagnosis not present

## 2016-08-24 DIAGNOSIS — R7989 Other specified abnormal findings of blood chemistry: Secondary | ICD-10-CM | POA: Diagnosis not present

## 2016-08-24 DIAGNOSIS — K5909 Other constipation: Secondary | ICD-10-CM | POA: Diagnosis present

## 2016-08-24 DIAGNOSIS — R069 Unspecified abnormalities of breathing: Secondary | ICD-10-CM | POA: Diagnosis not present

## 2016-08-24 DIAGNOSIS — Z66 Do not resuscitate: Secondary | ICD-10-CM | POA: Diagnosis not present

## 2016-08-24 DIAGNOSIS — J181 Lobar pneumonia, unspecified organism: Secondary | ICD-10-CM | POA: Diagnosis not present

## 2016-08-24 DIAGNOSIS — R5381 Other malaise: Secondary | ICD-10-CM

## 2016-08-24 DIAGNOSIS — E7800 Pure hypercholesterolemia, unspecified: Secondary | ICD-10-CM | POA: Diagnosis present

## 2016-08-24 DIAGNOSIS — I4892 Unspecified atrial flutter: Secondary | ICD-10-CM | POA: Diagnosis present

## 2016-08-24 DIAGNOSIS — J45901 Unspecified asthma with (acute) exacerbation: Secondary | ICD-10-CM | POA: Diagnosis not present

## 2016-08-24 DIAGNOSIS — I5032 Chronic diastolic (congestive) heart failure: Secondary | ICD-10-CM | POA: Diagnosis present

## 2016-08-24 DIAGNOSIS — R05 Cough: Secondary | ICD-10-CM | POA: Diagnosis not present

## 2016-08-24 DIAGNOSIS — J9621 Acute and chronic respiratory failure with hypoxia: Secondary | ICD-10-CM | POA: Diagnosis not present

## 2016-08-24 DIAGNOSIS — F419 Anxiety disorder, unspecified: Secondary | ICD-10-CM | POA: Diagnosis not present

## 2016-08-24 DIAGNOSIS — J168 Pneumonia due to other specified infectious organisms: Secondary | ICD-10-CM | POA: Diagnosis not present

## 2016-08-24 DIAGNOSIS — I110 Hypertensive heart disease with heart failure: Secondary | ICD-10-CM | POA: Diagnosis present

## 2016-08-24 DIAGNOSIS — R748 Abnormal levels of other serum enzymes: Secondary | ICD-10-CM | POA: Diagnosis not present

## 2016-08-24 DIAGNOSIS — I10 Essential (primary) hypertension: Secondary | ICD-10-CM | POA: Diagnosis not present

## 2016-08-24 DIAGNOSIS — M6281 Muscle weakness (generalized): Secondary | ICD-10-CM | POA: Diagnosis not present

## 2016-08-24 HISTORY — DX: Atherosclerotic heart disease of native coronary artery without angina pectoris: I25.10

## 2016-08-24 LAB — I-STAT ARTERIAL BLOOD GAS, ED
Acid-base deficit: 2 mmol/L (ref 0.0–2.0)
BICARBONATE: 22.7 mmol/L (ref 20.0–28.0)
O2 SAT: 98 %
PCO2 ART: 37.7 mmHg (ref 32.0–48.0)
Patient temperature: 99.6
TCO2: 24 mmol/L (ref 0–100)
pH, Arterial: 7.39 (ref 7.350–7.450)
pO2, Arterial: 114 mmHg — ABNORMAL HIGH (ref 83.0–108.0)

## 2016-08-24 LAB — CBC WITH DIFFERENTIAL/PLATELET
BASOS ABS: 0 10*3/uL (ref 0.0–0.1)
BASOS PCT: 0 %
Eosinophils Absolute: 0 10*3/uL (ref 0.0–0.7)
Eosinophils Relative: 0 %
HEMATOCRIT: 41.3 % (ref 36.0–46.0)
HEMOGLOBIN: 14 g/dL (ref 12.0–15.0)
LYMPHS PCT: 8 %
Lymphs Abs: 1.2 10*3/uL (ref 0.7–4.0)
MCH: 29.4 pg (ref 26.0–34.0)
MCHC: 33.9 g/dL (ref 30.0–36.0)
MCV: 86.6 fL (ref 78.0–100.0)
Monocytes Absolute: 0.9 10*3/uL (ref 0.1–1.0)
Monocytes Relative: 6 %
NEUTROS ABS: 13 10*3/uL — AB (ref 1.7–7.7)
NEUTROS PCT: 86 %
Platelets: 210 10*3/uL (ref 150–400)
RBC: 4.77 MIL/uL (ref 3.87–5.11)
RDW: 13.3 % (ref 11.5–15.5)
WBC: 15.2 10*3/uL — AB (ref 4.0–10.5)

## 2016-08-24 LAB — TSH: TSH: 1.201 u[IU]/mL (ref 0.350–4.500)

## 2016-08-24 LAB — GLUCOSE, CAPILLARY: GLUCOSE-CAPILLARY: 206 mg/dL — AB (ref 65–99)

## 2016-08-24 LAB — URINALYSIS, ROUTINE W REFLEX MICROSCOPIC
Bilirubin Urine: NEGATIVE
GLUCOSE, UA: 100 mg/dL — AB
KETONES UR: 40 mg/dL — AB
LEUKOCYTES UA: NEGATIVE
NITRITE: NEGATIVE
PH: 6.5 (ref 5.0–8.0)
Protein, ur: 100 mg/dL — AB
Specific Gravity, Urine: 1.02 (ref 1.005–1.030)

## 2016-08-24 LAB — URINE MICROSCOPIC-ADD ON

## 2016-08-24 LAB — TROPONIN I
TROPONIN I: 0.65 ng/mL — AB (ref <0.03)
Troponin I: 0.04 ng/mL (ref <0.03)
Troponin I: 0.16 ng/mL (ref <0.03)

## 2016-08-24 LAB — COMPREHENSIVE METABOLIC PANEL
ALBUMIN: 3.4 g/dL — AB (ref 3.5–5.0)
ALT: 24 U/L (ref 14–54)
AST: 38 U/L (ref 15–41)
Alkaline Phosphatase: 61 U/L (ref 38–126)
Anion gap: 16 — ABNORMAL HIGH (ref 5–15)
BUN: 20 mg/dL (ref 6–20)
CO2: 24 mmol/L (ref 22–32)
Calcium: 9.4 mg/dL (ref 8.9–10.3)
Chloride: 96 mmol/L — ABNORMAL LOW (ref 101–111)
Creatinine, Ser: 0.99 mg/dL (ref 0.44–1.00)
GFR calc Af Amer: 56 mL/min — ABNORMAL LOW (ref >60)
GFR calc non Af Amer: 49 mL/min — ABNORMAL LOW (ref >60)
GLUCOSE: 169 mg/dL — AB (ref 65–99)
POTASSIUM: 2.9 mmol/L — AB (ref 3.5–5.1)
SODIUM: 136 mmol/L (ref 135–145)
TOTAL PROTEIN: 7.3 g/dL (ref 6.5–8.1)
Total Bilirubin: 0.6 mg/dL (ref 0.3–1.2)

## 2016-08-24 LAB — I-STAT CG4 LACTIC ACID, ED
LACTIC ACID, VENOUS: 3.77 mmol/L — AB (ref 0.5–1.9)
LACTIC ACID, VENOUS: 1.71 mmol/L (ref 0.5–1.9)

## 2016-08-24 LAB — LIPID PANEL
CHOL/HDL RATIO: 1.8 ratio
CHOLESTEROL: 163 mg/dL (ref 0–200)
HDL: 93 mg/dL (ref >40)
LDL Cholesterol: 56 mg/dL (ref 0–99)
TRIGLYCERIDES: 68 mg/dL (ref <150)
VLDL: 14 mg/dL (ref 0–40)

## 2016-08-24 LAB — APTT: aPTT: 34 seconds (ref 24–36)

## 2016-08-24 LAB — MAGNESIUM: Magnesium: 2.1 mg/dL (ref 1.7–2.4)

## 2016-08-24 LAB — PROTIME-INR
INR: 1.08
Prothrombin Time: 14 seconds (ref 11.4–15.2)

## 2016-08-24 LAB — HEPARIN LEVEL (UNFRACTIONATED): Heparin Unfractionated: 0.25 IU/mL — ABNORMAL LOW (ref 0.30–0.70)

## 2016-08-24 LAB — STREP PNEUMONIAE URINARY ANTIGEN: Strep Pneumo Urinary Antigen: NEGATIVE

## 2016-08-24 LAB — I-STAT BETA HCG BLOOD, ED (MC, WL, AP ONLY): I-stat hCG, quantitative: 26.8 m[IU]/mL — ABNORMAL HIGH (ref <5)

## 2016-08-24 LAB — LACTIC ACID, PLASMA: Lactic Acid, Venous: 1.7 mmol/L (ref 0.5–1.9)

## 2016-08-24 MED ORDER — HEPARIN (PORCINE) IN NACL 100-0.45 UNIT/ML-% IJ SOLN
700.0000 [IU]/h | INTRAMUSCULAR | Status: DC
  Administered 2016-08-26: 800 [IU]/h via INTRAVENOUS
  Filled 2016-08-24: qty 250

## 2016-08-24 MED ORDER — HEPARIN (PORCINE) IN NACL 100-0.45 UNIT/ML-% IJ SOLN
650.0000 [IU]/h | INTRAMUSCULAR | Status: DC
  Administered 2016-08-24: 650 [IU]/h via INTRAVENOUS

## 2016-08-24 MED ORDER — ALPRAZOLAM 0.25 MG PO TABS
0.2500 mg | ORAL_TABLET | Freq: Two times a day (BID) | ORAL | Status: DC | PRN
  Administered 2016-08-29 – 2016-09-01 (×4): 0.25 mg via ORAL
  Filled 2016-08-24 (×4): qty 1

## 2016-08-24 MED ORDER — INSULIN ASPART 100 UNIT/ML ~~LOC~~ SOLN
0.0000 [IU] | Freq: Three times a day (TID) | SUBCUTANEOUS | Status: DC
  Administered 2016-08-25 – 2016-08-26 (×5): 2 [IU] via SUBCUTANEOUS
  Administered 2016-08-28: 3 [IU] via SUBCUTANEOUS
  Administered 2016-08-29 – 2016-08-31 (×2): 1 [IU] via SUBCUTANEOUS

## 2016-08-24 MED ORDER — SODIUM CHLORIDE 0.9 % IV SOLN
10.0000 mL/h | INTRAVENOUS | Status: DC
  Administered 2016-08-24: 20 mL/h via INTRAVENOUS

## 2016-08-24 MED ORDER — ASPIRIN 81 MG PO CHEW
324.0000 mg | CHEWABLE_TABLET | Freq: Once | ORAL | Status: AC
  Administered 2016-08-24: 243 mg via ORAL
  Filled 2016-08-24: qty 4

## 2016-08-24 MED ORDER — POTASSIUM CHLORIDE CRYS ER 20 MEQ PO TBCR: 40.0000 meq | EXTENDED_RELEASE_TABLET | Freq: Once | ORAL | Status: DC

## 2016-08-24 MED ORDER — IPRATROPIUM-ALBUTEROL 0.5-2.5 (3) MG/3ML IN SOLN
3.0000 mL | Freq: Four times a day (QID) | RESPIRATORY_TRACT | Status: DC
  Administered 2016-08-24 – 2016-08-25 (×4): 3 mL via RESPIRATORY_TRACT
  Filled 2016-08-24 (×4): qty 3

## 2016-08-24 MED ORDER — VANCOMYCIN HCL IN DEXTROSE 1-5 GM/200ML-% IV SOLN
1000.0000 mg | Freq: Once | INTRAVENOUS | Status: AC
  Administered 2016-08-24: 1000 mg via INTRAVENOUS
  Filled 2016-08-24: qty 200

## 2016-08-24 MED ORDER — IPRATROPIUM-ALBUTEROL 0.5-2.5 (3) MG/3ML IN SOLN
3.0000 mL | RESPIRATORY_TRACT | Status: DC
  Administered 2016-08-24: 3 mL via RESPIRATORY_TRACT
  Filled 2016-08-24: qty 3

## 2016-08-24 MED ORDER — ACETAMINOPHEN 325 MG PO TABS
650.0000 mg | ORAL_TABLET | ORAL | Status: DC | PRN
  Administered 2016-08-31 – 2016-09-01 (×2): 650 mg via ORAL
  Filled 2016-08-24 (×2): qty 2

## 2016-08-24 MED ORDER — POTASSIUM CHLORIDE 10 MEQ/100ML IV SOLN
10.0000 meq | INTRAVENOUS | Status: AC
  Administered 2016-08-24 – 2016-08-25 (×4): 10 meq via INTRAVENOUS
  Filled 2016-08-24 (×4): qty 100

## 2016-08-24 MED ORDER — HEPARIN SODIUM (PORCINE) 5000 UNIT/ML IJ SOLN
60.0000 [IU]/kg | INTRAMUSCULAR | Status: AC
  Administered 2016-08-24: 2850 [IU] via INTRAVENOUS
  Filled 2016-08-24: qty 1

## 2016-08-24 MED ORDER — SODIUM CHLORIDE 0.9 % IV BOLUS (SEPSIS)
1000.0000 mL | Freq: Once | INTRAVENOUS | Status: AC
  Administered 2016-08-24: 1000 mL via INTRAVENOUS

## 2016-08-24 MED ORDER — METHYLPREDNISOLONE SODIUM SUCC 40 MG IJ SOLR
40.0000 mg | Freq: Two times a day (BID) | INTRAMUSCULAR | Status: DC
  Administered 2016-08-25 (×2): 40 mg via INTRAVENOUS
  Filled 2016-08-24 (×2): qty 1

## 2016-08-24 MED ORDER — DILTIAZEM HCL-DEXTROSE 100-5 MG/100ML-% IV SOLN (PREMIX)
5.0000 mg/h | INTRAVENOUS | Status: DC
  Administered 2016-08-24: 5 mg/h via INTRAVENOUS
  Filled 2016-08-24 (×2): qty 100

## 2016-08-24 MED ORDER — DILTIAZEM LOAD VIA INFUSION
10.0000 mg | Freq: Once | INTRAVENOUS | Status: AC
  Administered 2016-08-24: 10 mg via INTRAVENOUS
  Filled 2016-08-24: qty 10

## 2016-08-24 MED ORDER — METOPROLOL TARTRATE 5 MG/5ML IV SOLN: 10.0000 mg | Freq: Once | INTRAVENOUS | Status: DC

## 2016-08-24 MED ORDER — SIMVASTATIN 20 MG PO TABS
20.0000 mg | ORAL_TABLET | Freq: Every day | ORAL | Status: DC
  Administered 2016-08-25: 20 mg via ORAL
  Filled 2016-08-24: qty 1

## 2016-08-24 MED ORDER — ASPIRIN 81 MG PO TABS
81.0000 mg | ORAL_TABLET | Freq: Every day | ORAL | Status: DC
Start: 2016-08-24 — End: 2016-08-24

## 2016-08-24 MED ORDER — ONDANSETRON HCL 4 MG/2ML IJ SOLN: 4.0000 mg | Freq: Four times a day (QID) | INTRAMUSCULAR | Status: DC | PRN

## 2016-08-24 MED ORDER — ASPIRIN EC 81 MG PO TBEC
81.0000 mg | DELAYED_RELEASE_TABLET | Freq: Every day | ORAL | Status: DC
  Administered 2016-08-25 – 2016-09-01 (×8): 81 mg via ORAL
  Filled 2016-08-24 (×8): qty 1

## 2016-08-24 MED ORDER — MOMETASONE FURO-FORMOTEROL FUM 100-5 MCG/ACT IN AERO: 2.0000 | INHALATION_SPRAY | Freq: Two times a day (BID) | RESPIRATORY_TRACT | Status: DC

## 2016-08-24 MED ORDER — HEPARIN (PORCINE) IN NACL 100-0.45 UNIT/ML-% IJ SOLN
650.0000 [IU]/h | INTRAMUSCULAR | Status: DC
  Administered 2016-08-24: 650 [IU]/h via INTRAVENOUS
  Filled 2016-08-24: qty 250

## 2016-08-24 MED ORDER — DEXTROSE 5 % IV SOLN
1.0000 g | Freq: Once | INTRAVENOUS | Status: AC
  Administered 2016-08-24: 1 g via INTRAVENOUS
  Filled 2016-08-24 (×2): qty 1

## 2016-08-24 MED ORDER — CEFEPIME HCL 1 G IJ SOLR
1.0000 g | INTRAMUSCULAR | Status: DC
  Administered 2016-08-25: 1 g via INTRAVENOUS
  Filled 2016-08-24 (×2): qty 1

## 2016-08-24 MED ORDER — CLOPIDOGREL BISULFATE 75 MG PO TABS
75.0000 mg | ORAL_TABLET | ORAL | Status: DC
  Administered 2016-08-26 – 2016-09-01 (×5): 75 mg via ORAL
  Filled 2016-08-24 (×5): qty 1

## 2016-08-24 MED ORDER — SODIUM CHLORIDE 0.9 % IV SOLN
500.0000 mg | INTRAVENOUS | Status: DC
  Administered 2016-08-25: 500 mg via INTRAVENOUS
  Filled 2016-08-24 (×2): qty 500

## 2016-08-24 NOTE — ED Triage Notes (Signed)
Pt arrives from home via [facility] reporting cough, SOB and wheezing since Friday.  Pt reports getting flu shot on Monday last week and starting on ABX or cough on Friday.  EMS reports 85%RA sats, reports giving:  125 mg SoluMedrol 10 mg Alubterol 0.5 Atrovent  Breathing labored.  Pt reports L CP, "pressure". MD at bedside.

## 2016-08-24 NOTE — Progress Notes (Signed)
RN paged because Heparin drip was d/c'd and RN thinks this is a mistake. NP spoke to Dr. Jacinto HalimGanji, cardiology, who is consulting on pt. Per Dr.Ganji, pt should be on Heparin drip for elevated troponin and Aflutter. Heparin drip reordered to be managed by pharmacy. KJKG, NP [facility]

## 2016-08-24 NOTE — Progress Notes (Signed)
Pharmacy Antibiotic Note  Angelica ColaDevyani [name] is a 82 y.o. female admitted on 08/24/2016 with pneumonia.  Pharmacy has been consulted for vancomycin dosing. Pt with Tmax of 99.6 and WBC is elevated at 15.2. Scr 0.99 and lactic acid is elevated at 3.77. Pt was started on levaquin PTA.   Plan: - Vancomycin 1gm IV x 1 then 500mg  IV Q24H - F/u renal fxn, C&S, clinical status and [facility] - F/u continuation of cefepime or other gram negative coverage as appropriate  Height: 4\' 10"  (147.3 cm) Weight: 104 lb (47.2 kg) IBW/kg (Calculated) : 40.9  Temp (24hrs), Avg:99.6 F (37.6 C), Min:99.6 F (37.6 C), Max:99.6 F (37.6 C)   Recent Labs Lab 08/24/16 1136 08/24/16 1207  WBC 15.2*  --   CREATININE 0.99  --   LATICACIDVEN  --  3.77*    Estimated Creatinine Clearance: 24.4 mL/min (by C-G formula based on SCr of 0.99 mg/dL).    Allergies  Allergen Reactions  . Penicillins Nausea And Vomiting    Antimicrobials this admission: Vanc 10/16>> Cefepime x 1  Dose adjustments this admission: N/A  Microbiology results: Pending  Thank you for allowing pharmacy to be a part of this patient's care.  [name], [name] 08/24/2016 1:15 PM

## 2016-08-24 NOTE — ED Notes (Signed)
This RN made aware by Kenney Housemananya, EMT, of pt's increased work of breathing.  This RN re-assessed, RT and Dr. Konrad DoloresMerrell paged to bedside.  O2 increased to 4L.

## 2016-08-24 NOTE — Consult Note (Signed)
PULMONARY / CRITICAL CARE MEDICINE   Name: [name] MRN: [id number] DOB: May 31, 1924    ADMISSION DATE:  08/24/2016 CONSULTATION DATE: 08/24/2016  REFERRING MD:  Dr. Konrad Dolores  CHIEF COMPLAINT:  Short of breath  HISTORY OF PRESENT ILLNESS:   Hx from speaking with family and review of medical record.  She has hx of asthma/COPD and followed by Dr. Kendrick Fries in pulmonary office.  She developed cough with thick sputum over the past few days.  Daughter feels this started after she got a flu shot.  She was taken to urgent care and started on an antibiotic (levaquin).  She uses advair at home, and was needing to use albuterol every 4 to 6 hours over the past two days.  She was also having wheezing and low grade temperature.  She was noted to have elevated lactic acid and given IV fluid with improvement.  Her CXR showed Lt base infiltrate and she was given antibiotics.  She was started on supplemental oxygen and Bipap.  After this she was difficult to arouse, but then later woke up and was in usual state of alertness.  She was noted to have A fib with RVR and started on cardizem gtt and heparin gtt.  PAST MEDICAL HISTORY :  She  has a past medical history of Asthma; Coronary artery disease; High cholesterol; and Hypertension.  PAST SURGICAL HISTORY: She  has no past surgical history on file.  Allergies  Allergen Reactions  . Penicillins Nausea And Vomiting    No current facility-administered medications on file prior to encounter.    Current Outpatient Prescriptions on File Prior to Encounter  Medication Sig  . ADVAIR DISKUS 100-50 MCG/DOSE AEPB Inhale 1 puff into the lungs 2 (two) times daily.   Marland Kitchen albuterol (PROVENTIL) (2.5 MG/3ML) 0.083% nebulizer solution Take 3 mLs (2.5 mg total) by nebulization every 6 (six) hours as needed for wheezing or shortness of breath.  Marland Kitchen aspirin 81 MG tablet Take 81 mg by mouth daily.  . clopidogrel (PLAVIX) 75 MG tablet Take 75 mg by mouth every other day.  Patient takes on Sunday, Monday, Wednesday, friday  . furosemide (LASIX) 20 MG tablet Take 20 mg by mouth daily as needed.  . metoprolol succinate (TOPROL-XL) 25 MG 24 hr tablet Take 25 mg by mouth daily.  . simvastatin (ZOCOR) 20 MG tablet Take 20 mg by mouth daily.  . valsartan (DIOVAN) 80 MG tablet Take 80 mg by mouth daily.  . carvedilol (COREG) 6.25 MG tablet   . nitroGLYCERIN (NITRODUR - DOSED IN MG/24 HR) 0.4 mg/hr patch     FAMILY HISTORY:  Her indicated that the status of her mother is unknown. She indicated that the status of her father is unknown.    SOCIAL HISTORY: She  reports that she has never smoked. She has never used smokeless tobacco. She reports that she does not drink alcohol or use drugs.  REVIEW OF SYSTEMS:   Unable to obtain  SUBJECTIVE:  Feels Bipap mask is too tight  VITAL SIGNS: BP 118/62   Pulse 109   Temp 99.6 F (37.6 C) (Oral)   Resp 21   Ht 4\' 10"  (1.473 m)   Wt 104 lb (47.2 kg)   SpO2 96%   BMI 21.74 kg/m   VENTILATOR SETTINGS: FiO2 (%):  [36 %-40 %] 40 %  INTAKE / OUTPUT: No intake/output data recorded.  PHYSICAL EXAMINATION: General:  Thin Neuro:  Alert, follows simple commands, mild tremor HEENT:  Pupils reactive, Bipap mask  on  Cardiovascular:  Irregular, no murmur Lungs:  Decreased BS, no wheeze, faint crackles Lt > Rt base Abdomen:  Soft, non tender, decreased bowel sounds Musculoskeletal:  No edema Skin:  No rashes  LABS:  BMET  Recent Labs Lab 08/24/16 1136  NA 136  K 2.9*  CL 96*  CO2 24  BUN 20  CREATININE 0.99  GLUCOSE 169*    Electrolytes  Recent Labs Lab 08/24/16 1136  CALCIUM 9.4    CBC  Recent Labs Lab 08/24/16 1136  WBC 15.2*  HGB 14.0  HCT 41.3  PLT 210    Coag's  Recent Labs Lab 08/24/16 1136  APTT 34  INR 1.08    Sepsis Markers  Recent Labs Lab 08/24/16 1207 08/24/16 1712  LATICACIDVEN 3.77* 1.71    ABG  Recent Labs Lab 08/24/16 1637  PHART 7.390  PCO2ART  37.7  PO2ART 114.0*    Liver Enzymes  Recent Labs Lab 08/24/16 1136  AST 38  ALT 24  ALKPHOS 61  BILITOT 0.6  ALBUMIN 3.4*    Cardiac Enzymes  Recent Labs Lab 08/24/16 1136  TROPONINI 0.04*    Glucose No results for input(s): GLUCAP in the last 168 hours.  Imaging Dg Chest Portable 1 View  Result Date: 08/24/2016 CLINICAL DATA:  Cough, shortness of breath and wheezing for 4 days. EXAM: PORTABLE CHEST 1 VIEW COMPARISON:  Chest radiograph August 24, 2016 at 1145 hours FINDINGS: Patient now rotated to the LEFT, accentuating the LEFT heart. Cardiomediastinal silhouette is normal, mildly calcified aortic knob. Increased lung volumes and mild chronic interstitial changes. Increasing patchy LEFT lung base airspace opacity. Mild blunting of the costophrenic angles. RIGHT midlung zone granuloma. No pneumothorax. Osteopenia. Soft tissue planes are nonsuspicious. IMPRESSION: Increasing LEFT lung base airspace opacity concerning for pneumonia versus atelectasis in a background of probable COPD. Recommend follow-up PA and lateral views of the chest when clinically able. Small pleural thickening versus pleural thickening. Electronically Signed   By: Awilda Metroourtnay  Bloomer M.D.   On: 08/24/2016 15:33   Dg Chest Port 1 View  Result Date: 08/24/2016 CLINICAL DATA:  Chest pain.  Shortness of breath. EXAM: PORTABLE CHEST 1 VIEW COMPARISON:  04/10/2016. FINDINGS: Mediastinum and hilar structures stable. Mild hilar prominence unchanged most likely vascular. Heart size stable. Low lung volumes. Mild bibasilar infiltrates and or edema cannot be completely excluded. Small bilateral pleural effusions versus pleural scarring noted. No pneumothorax. IMPRESSION: 1. Low lung volumes. Mild bibasilar infiltrates and or edema cannot be excluded. Small bilateral pleural effusions versus pleural scarring noted. 2. Heart size normal. Electronically Signed   By: Maisie Fushomas  Register   On: 08/24/2016 12:08     STUDIES:    CULTURES: Blood 10/16 >> Urine 10/16 >>  Pneumococcal Ag 10/16 >> negative  ANTIBIOTICS: Vancomycin 10/16 >> Cefepime 10/16 >>   SIGNIFICANT EVENTS: 10/16 Admit  LINES/TUBES:  DISCUSSION: 82 yo female with sepsis and acute hypoxic respiratory failure from PNA and AECOPD complicated by A fib with RVR.  ASSESSMENT / PLAN:  PULMONARY A: Acute hypoxic respiratory failure. P:   Oxygen to keep SpO2 90 to 95% Bipap as needed Scheduled duoneb Continue solumedrol Hold dulera for now  CARDIOVASCULAR A:  A fib with RVR. Hx of HTN, diastolic CHF, HLD. P:  Cardizem, heparin gtt per primary team  RENAL A:   Hypokalemia. P:   Replace electrolytes as needed  GASTROINTESTINAL A:   Nutrition. P:   NPO while on Bipap  HEMATOLOGIC A:   Leukocytosis. P:  F/u CBC  INFECTIOUS A:   Pneumonia. P:   Day 1 of Abx  ENDOCRINE A:   Steroid induced hyperglycemia. P:   SSI  NEUROLOGIC A:   Acute metabolic encephalopathy. Hx of dementia. P:   Monitor mental status   Goals of care: D/w pt's daughter at bedside and pt's son (retired Therapist, sports in [place]).  Most of family is in agreement not to pursue aggressive measures such as intubation and cardiac resuscitation in the event of cardiac arrest.  Dr. Doylene Canning reports that there is one family member (pt's sister) who has not come to terms with this decision.  As a result family would like to discuss goals of care further before committing to DNR status.  Will continue discussion as we go ahead.  D/w Dr. Konrad Dolores.  Okay to admit to SDU.  CC time 38 minutes.  Coralyn Helling, MD [facility] Pulmonary/Critical Care 08/24/2016, 5:20 PM Pager:  [phone] After 3pm call: [phone]

## 2016-08-24 NOTE — ED Notes (Addendum)
Attempted to speak with RN about pt coming to floor after being seen by CCM. Unable to speak to RN at this time. Number left. will transport pt to floor as report has been called and seen by CCm.

## 2016-08-24 NOTE — Consult Note (Signed)
CARDIOLOGY CONSULT NOTE  Patient ID: [name] MRN: [id number] DOB/AGE: 15-Jun-1924 82 y.o.  Admit date: 08/24/2016 Referring Physician  Shelly Flatten, MD Primary Physician:  Darrow Bussing, MD Reason for Consultation  CAD, A. Flutter  HPI: [name]  is a 82 y.o. Asian [place] female who has known coronary artery disease.  She has history of known coronary artery disease with coronary angiogram in 2014 revealing 40% left main, heavily calcified LAD occluded proximally and retrograde filling from the left circumflex.  Large dominant RCA with a 70% mid stenosis, LVEF 60-65%.  Due to her advanced age, heavy calcification, she was recommended echo therapy.  Her last echocardiogram in our office on 04/15/2016 at a rate normal LVEF and mild aortic regurgitation and mild tricuspid regurgitation.  She had been doing well and is remained stable without recurrence of angina or heart failure on appropriate medical therapy.  Patient had cough, chills, fatigue after she received the flu shot 4 days ago.  Patient's daughter called the EMS this morning as she was getting worse with having respiratory distress, worsening cough and wheezing and also chest pain.  On admission to the hospital she was found to have pneumonia, atrial flutter with RVR with significant EKG abnormalities to suggest myocardial ischemia and minimally positive troponin.  She is admitted to the hospital for management of the same I have been consulted to evaluate for and manage CAD.  Patient on initial admission was lethargic, hypoxemic. However with IV hydration and oxygen supplementation, mental status improved. Presently she is alert and is able to verbalize and given history. She states that her chest pain symptoms have completely resolved since being admitted to the hospital, also states that her breathing is improved significantly. Her daughter is present at the bedside.  Past Medical History:  Diagnosis Date  . Asthma   .  Coronary artery disease   . High cholesterol   . Hypertension      History reviewed. No pertinent surgical history.   Family History  Problem Relation Age of Onset  . CAD Mother   . CAD Father      Social History: Social History   Social History  . Marital status: Widowed    Spouse name: N/A  . Number of children: N/A  . Years of education: N/A   Occupational History  . Not on file.   Social History Main Topics  . Smoking status: Never Smoker  . Smokeless tobacco: Never Used  . Alcohol use No  . Drug use: No  . Sexual activity: Not on file   Other Topics Concern  . Not on file   Social History Narrative  . No narrative on file     Prescriptions Prior to Admission  Medication Sig Dispense Refill Last Dose  . ADVAIR DISKUS 100-50 MCG/DOSE AEPB Inhale 1 puff into the lungs 2 (two) times daily.    08/24/2016 at Unknown time  . albuterol (PROVENTIL) (2.5 MG/3ML) 0.083% nebulizer solution Take 3 mLs (2.5 mg total) by nebulization every 6 (six) hours as needed for wheezing or shortness of breath. 360 mL 5 08/24/2016 at Unknown time  . aspirin 81 MG tablet Take 81 mg by mouth daily.   08/24/2016 at Unknown time  . Calcium-Magnesium-Zinc 167-83-8 MG TABS Take 1 tablet by mouth daily.   08/23/2016 at Unknown time  . clopidogrel (PLAVIX) 75 MG tablet Take 75 mg by mouth every other day. Patient takes on Sunday, Monday, Wednesday, friday   08/24/2016 at 9a  . furosemide (  LASIX) 20 MG tablet Take 20 mg by mouth daily as needed.   08/23/2016 at Unknown time  . levofloxacin (LEVAQUIN) 500 MG tablet Take 500 mg by mouth daily. Started on 08-21-16, for 7 days. On day 3 of therapy   08/23/2016 at 12p  . metoprolol succinate (TOPROL-XL) 25 MG 24 hr tablet Take 25 mg by mouth daily.   08/24/2016 at 9a  . Multiple Vitamins-Minerals (MULTIVITAMIN WITH MINERALS) tablet Take 1 tablet by mouth daily.   08/23/2016 at Unknown time  . simvastatin (ZOCOR) 20 MG tablet Take 20 mg by mouth daily.    08/23/2016 at Unknown time  . valsartan (DIOVAN) 80 MG tablet Take 80 mg by mouth daily.   08/24/2016 at Unknown time  . carvedilol (COREG) 6.25 MG tablet    Not Taking at Unknown time  . nitroGLYCERIN (NITRODUR - DOSED IN MG/24 HR) 0.4 mg/hr patch    unk     ROS: General: fevers/chills/night sweats present Eyes: no blurry vision, diplopia, or amaurosis ENT: no sore throat or hearing loss Resp: cough, wheezing, present CV: Mild chronic edema  GI: no abdominal pain, nausea, vomiting, diarrhea, or constipation GU: no dysuria, frequency, or hematuria Skin: no rash Neuro: no headache, numbness, tingling, or weakness of extremities Musculoskeletal: no joint pain or swelling Heme: no bleeding, DVT, or easy bruising Endo: no polydipsia or polyuria    Physical Exam: Blood pressure 121/61, pulse (!) 105, temperature 98.8 F (37.1 C), temperature source Oral, resp. rate (!) 23, height 5' (1.524 m), weight 47.2 kg (104 lb), SpO2 97 %.   General appearance: alert, cooperative, appears stated age, no distress and frail Lungs: Difficult to appreciate breath sounds on BIPAP Heart: regular rate and rhythm, S1, S2 normal, no murmur, click, rub or gallop Abdomen: soft, non-tender; bowel sounds normal; no masses,  no organomegaly Extremities: edema 1 plus bilateral below knee Pulses: 2+ and symmetric Neurologic: Grossly normal  Labs:   Lab Results  Component Value Date   WBC 15.2 (H) 08/24/2016   HGB 14.0 08/24/2016   HCT 41.3 08/24/2016   MCV 86.6 08/24/2016   PLT 210 08/24/2016    Recent Labs Lab 08/24/16 1136  NA 136  K 2.9*  CL 96*  CO2 24  BUN 20  CREATININE 0.99  CALCIUM 9.4  PROT 7.3  BILITOT 0.6  ALKPHOS 61  ALT 24  AST 38  GLUCOSE 169*    Lipid Panel     Component Value Date/Time   CHOL 163 08/24/2016 1136   TRIG 68 08/24/2016 1136   HDL 93 08/24/2016 1136   CHOLHDL 1.8 08/24/2016 1136   VLDL 14 08/24/2016 1136   LDLCALC 56 08/24/2016 1136    Recent  Labs  08/24/16 1136 08/24/16 1644  TROPONINI 0.04* 0.16*    Recent Labs  08/24/16 1644  TSH 1.201    EKG: 08/24/2016: Atrial flutter with 2:1 conduction at the rate of 130 bpm. Diffuse inferior and lateral ST segment depression with T-wave inversion suggestive of ischemia.  Echo 04/15/2016(office):  Normal LVEF by echocardiogram, mild diastolic dysfunction, no significant valvular abnormality, mild MR and TR.   Radiology: Dg Chest Portable 1 View  Result Date: 08/24/2016 CLINICAL DATA:  Cough, shortness of breath and wheezing for 4 days. EXAM: PORTABLE CHEST 1 VIEW COMPARISON:  Chest radiograph August 24, 2016 at 1145 hours FINDINGS: Patient now rotated to the LEFT, accentuating the LEFT heart. Cardiomediastinal silhouette is normal, mildly calcified aortic knob. Increased lung volumes and mild chronic interstitial  changes. Increasing patchy LEFT lung base airspace opacity. Mild blunting of the costophrenic angles. RIGHT midlung zone granuloma. No pneumothorax. Osteopenia. Soft tissue planes are nonsuspicious. IMPRESSION: Increasing LEFT lung base airspace opacity concerning for pneumonia versus atelectasis in a background of probable COPD. Recommend follow-up PA and lateral views of the chest when clinically able. Small pleural thickening versus pleural thickening. Electronically Signed   By: Awilda Metroourtnay  Bloomer M.D.   On: 08/24/2016 15:33   Dg Chest Port 1 View  Result Date: 08/24/2016 CLINICAL DATA:  Chest pain.  Shortness of breath. EXAM: PORTABLE CHEST 1 VIEW COMPARISON:  04/10/2016. FINDINGS: Mediastinum and hilar structures stable. Mild hilar prominence unchanged most likely vascular. Heart size stable. Low lung volumes. Mild bibasilar infiltrates and or edema cannot be completely excluded. Small bilateral pleural effusions versus pleural scarring noted. No pneumothorax. IMPRESSION: 1. Low lung volumes. Mild bibasilar infiltrates and or edema cannot be excluded. Small bilateral pleural  effusions versus pleural scarring noted. 2. Heart size normal. Electronically Signed   By: Maisie Fushomas  Register   On: 08/24/2016 12:08    Scheduled Meds: . Melene Muller[START ON 08/25/2016] aspirin EC  81 mg Oral Daily  . [START ON 08/25/2016] ceFEPime (MAXIPIME) IV  1 g Intravenous Q24H  . clopidogrel  75 mg Oral QODAY  . insulin aspart  0-9 Units Subcutaneous TID WC  . ipratropium-albuterol  3 mL Nebulization Q6H WA  . methylPREDNISolone (SOLU-MEDROL) injection  40 mg Intravenous Q12H  . potassium chloride  40 mEq Oral Once  . potassium chloride  40 mEq Oral Once  . simvastatin  20 mg Oral Daily  . [START ON 08/25/2016] vancomycin  500 mg Intravenous Q24H   Continuous Infusions: . sodium chloride 20 mL/hr (08/24/16 1331)  . diltiazem (CARDIZEM) infusion 10 mg/hr (08/24/16 1528)   PRN Meds:.acetaminophen, ALPRAZolam, ondansetron (ZOFRAN) IV  ASSESSMENT AND PLAN:  1. Respiratory distress due to PNA. 1. New A. Flutter with RVR. CHA2DS2-VASCScore: Risk Score  5,  Yearly risk of stroke  6.7. Recommendation: Anticoagulation   3. CAD native vessel with angina pectoris due to demand ischemia from #1 & #2. Coronary angiogram  05/17/2013 [facility] [place] : Left main: 40% distal lesion, 30-40% lesion in ostium. LAD: Heavily calcified and 70% occluded proximally, fills retrograde by the left circumflex. Left circumflex: Luminal irregularities, torturous. RCA: Large, dominant, 50-70% stenosis in the mid segment and in the distal segment. LVEF 60-65% 4. Chronic diastolic heart failure.  5. History of bronchial asthma  Recommendation: The EKG on telemetry is completely normalized, patient is now back in sinus rhythm and is maintaining sinus. I will continue IV heparin for both demand ischemia and for brief episode of atrial flutter which was probably precipitated by respiratory distress. If she continues to maintain sinus rhythm, she may not need long-term anticoagulation. No reason to repeat  echocardiogram as it was recently performed.  The patient's blood pressure is stable, in the morning we can transition her to by mouth diltiazem.  Yates DecampGANJI, Brienna Bass, MD 08/24/2016, 7:25 PM [facility] Pager: [phone] Office: [phone] If no answer Cell [phone]

## 2016-08-24 NOTE — Progress Notes (Signed)
Patient placed on 4 LPM [place] per family/RN request. Patient tolerating well at this time.

## 2016-08-24 NOTE — ED Notes (Signed)
Dr. Rush Landmarkegeler made aware of Lactic 3.77 and Troponin 0.04.

## 2016-08-24 NOTE — ED Provider Notes (Signed)
[facility]-EMERGENCY DEPT Provider Note   CSN: [id number] Arrival date & time: 08/24/16  1107     History   Chief Complaint Chief Complaint  Patient presents with  . Shortness of Breath  . Cough    HPI [name] is a 82 y.o. female With a past medical history significant for asthma, hypertension, CAD, And recent diagnosis of pneumonia on levofloxacin who presents with her daughter for shortness of breath, chest pain, Wheezing, and cough. Family reports that patient was normal last week however, beginning five days ago, she started having cough and shortness of breath. She says that she saw her doctor the following day and was started on an antibiotic for possible pneumonia. Patient says her symptoms have persisted and over the last two days has had worsening shortness of breath with chest pain. She described her chest pain as pressure like and tightness worsened with coughing. She says that it has been productive of a mucus like sputum. No hemoptysis. She reports subjective fevers and chills. No nausea, no vomiting, no diarrhea or dysuria. She says she has chronic constipation.  Patient reports that she was told she had "blockages in my heart" and that they "did not want to intervene yet."  She reports that her previous care was in [place] and she currently sees Dr. Jacinto Halim for cardiology care here. She denies history of DVT or PE and denies any lower extremity symptoms. She says that for wheezing today, she has taken four albuterol breathing treatments.   On arrival, EMS reported that they gave her a breathing treatment and Solu-Medrol for possible asthma exacerbation.     The history is provided by the patient and a relative (daughter). No language interpreter was used.  Shortness of Breath  This is a new problem. The average episode lasts 5 days. The problem occurs continuously.The problem has been gradually worsening. Associated symptoms include a fever (subjective),  rhinorrhea, cough, sputum production, wheezing and chest pain. Pertinent negatives include no headaches, no neck pain, no vomiting, no abdominal pain, no rash and no leg swelling. She has tried nothing for the symptoms. The treatment provided no relief. Associated medical issues include asthma and CAD.  Cough  This is a new problem. The current episode started more than 2 days ago. The problem occurs constantly. The cough is productive of sputum. Associated symptoms include chest pain, chills, rhinorrhea, shortness of breath and wheezing. Pertinent negatives include no headaches. She has tried nothing (Levofloxacin) for the symptoms. Her past medical history is significant for asthma.    Past Medical History:  Diagnosis Date  . Asthma   . High cholesterol   . Hypertension     Patient Active Problem List   Diagnosis Date Noted  . Dyspnea 04/10/2016  . Chronic obstructive airway disease with asthma (HCC) 04/10/2016    No past surgical history on file.  OB History    No data available       Home Medications    Prior to Admission medications   Medication Sig Start Date End Date Taking? Authorizing Provider  ADVAIR DISKUS 100-50 MCG/DOSE AEPB  08/11/16   Historical Provider, MD  albuterol (PROVENTIL) (2.5 MG/3ML) 0.083% nebulizer solution Take 3 mLs (2.5 mg total) by nebulization every 6 (six) hours as needed for wheezing or shortness of breath. 04/20/16   Lupita Leash, MD  ammonium lactate (LAC-HYDRIN) 12 % cream Apply topically as needed for dry skin. 05/14/16   Helane Gunther, DPM  aspirin 81 MG tablet Take  81 mg by mouth daily.    Historical Provider, MD  carvedilol (COREG) 6.25 MG tablet  08/11/16   Historical Provider, MD  clopidogrel (PLAVIX) 75 MG tablet Take 75 mg by mouth every other day.    Historical Provider, MD  furosemide (LASIX) 20 MG tablet Take 20 mg by mouth daily as needed.    Historical Provider, MD  metoprolol succinate (TOPROL-XL) 25 MG 24 hr tablet Take 25 mg by  mouth daily.    Historical Provider, MD  nitroGLYCERIN (NITRODUR - DOSED IN MG/24 HR) 0.4 mg/hr patch  08/11/16   Historical Provider, MD  simvastatin (ZOCOR) 20 MG tablet Take 20 mg by mouth daily.    Historical Provider, MD  valsartan (DIOVAN) 80 MG tablet Take 80 mg by mouth daily.    Historical Provider, MD    Family History No family history on file.  Social History Social History  Substance Use Topics  . Smoking status: Never Smoker  . Smokeless tobacco: Never Used  . Alcohol use No     Allergies   Penicillins   Review of Systems Review of Systems  Constitutional: Positive for chills and fever (subjective).  HENT: Positive for congestion and rhinorrhea.   Eyes: Negative for visual disturbance.  Respiratory: Positive for cough, sputum production, chest tightness, shortness of breath and wheezing. Negative for stridor.   Cardiovascular: Positive for chest pain. Negative for leg swelling.  Gastrointestinal: Positive for constipation (chronic). Negative for abdominal pain, diarrhea, nausea and vomiting.  Genitourinary: Negative for dysuria and flank pain.  Musculoskeletal: Negative for back pain, neck pain and neck stiffness.  Skin: Negative for rash.  Neurological: Negative for dizziness, weakness, light-headedness and headaches.  Psychiatric/Behavioral: Negative for agitation.  All other systems reviewed and are negative.    Physical Exam Updated Vital Signs SpO2 (!) 85% Comment: RA  Physical Exam  Constitutional: She is oriented to person, place, and time. She appears well-developed and well-nourished. No distress.  HENT:  Head: Normocephalic.  Mouth/Throat: Oropharynx is clear and moist. No oropharyngeal exudate.  Eyes: Conjunctivae and EOM are normal. Pupils are equal, round, and reactive to light.  Neck: Normal range of motion.  Cardiovascular: Normal heart sounds and intact distal pulses.  Tachycardia present.   No murmur heard. Pulmonary/Chest: No stridor.  Tachypnea noted. No respiratory distress. She has wheezes. She exhibits no tenderness.  Abdominal: Soft. There is no tenderness.  Musculoskeletal: She exhibits no tenderness.  Neurological: She is alert and oriented to person, place, and time. She has normal reflexes. She displays normal reflexes. A cranial nerve deficit is present. She exhibits normal muscle tone.  Skin: Capillary refill takes less than 2 seconds. No rash noted. She is not diaphoretic.  Psychiatric: She has a normal mood and affect.  Nursing note and vitals reviewed.    ED Treatments / Results  Labs (all labs ordered are listed, but only abnormal results are displayed) Labs Reviewed  COMPREHENSIVE METABOLIC PANEL - Abnormal; Notable for the following:       Result Value   Potassium 2.9 (*)    Chloride 96 (*)    Glucose, Bld 169 (*)    Albumin 3.4 (*)    GFR calc non Af Amer 49 (*)    GFR calc Af Amer 56 (*)    Anion gap 16 (*)    All other components within normal limits  CBC WITH DIFFERENTIAL/PLATELET - Abnormal; Notable for the following:    WBC 15.2 (*)    Neutro Abs 13.0 (*)  All other components within normal limits  URINALYSIS, ROUTINE W REFLEX MICROSCOPIC (NOT AT [facility]) - Abnormal; Notable for the following:    Color, Urine AMBER (*)    Glucose, UA 100 (*)    Hgb urine dipstick LARGE (*)    Ketones, ur 40 (*)    Protein, ur 100 (*)    All other components within normal limits  TROPONIN I - Abnormal; Notable for the following:    Troponin I 0.04 (*)    All other components within normal limits  TROPONIN I - Abnormal; Notable for the following:    Troponin I 0.16 (*)    All other components within normal limits  URINE MICROSCOPIC-ADD ON - Abnormal; Notable for the following:    Squamous Epithelial / LPF 0-5 (*)    Bacteria, UA RARE (*)    All other components within normal limits  I-STAT BETA HCG BLOOD, ED ([facility], [facility], [facility] ONLY) - Abnormal; Notable for the following:    I-stat hCG, quantitative 26.8  (*)    All other components within normal limits  I-STAT CG4 LACTIC ACID, ED - Abnormal; Notable for the following:    Lactic Acid, Venous 3.77 (*)    All other components within normal limits  I-STAT ARTERIAL BLOOD GAS, ED - Abnormal; Notable for the following:    pO2, Arterial 114.0 (*)    All other components within normal limits  CULTURE, BLOOD (ROUTINE X 2)  CULTURE, BLOOD (ROUTINE X 2)  URINE CULTURE  CULTURE, EXPECTORATED SPUTUM-ASSESSMENT  GRAM STAIN  PROTIME-INR  APTT  LIPID PANEL  STREP PNEUMONIAE URINARY ANTIGEN  TSH  MAGNESIUM  LACTIC ACID, PLASMA  HEMOGLOBIN A1C  TROPONIN I  BASIC METABOLIC PANEL  LIPID PANEL  CBC  HEPARIN LEVEL (UNFRACTIONATED)  HEPARIN LEVEL (UNFRACTIONATED)  I-STAT CG4 LACTIC ACID, ED    EKG  EKG Interpretation  Date/Time:  Monday August 24 2016 11:17:48 EDT Ventricular Rate:  133 PR Interval:    QRS Duration: 78 QT Interval:  247 QTC Calculation: 365 R Axis:   79 Text Interpretation:  Atrial flutter Ventricular premature complex Repolarization abnormality, prob rate related Concern for ST Elevation in AVR with ST depressions in 2,3,AVF,V4, V5 Abnormal ECG Confirmed by Angelica Landmark MD, [name] [phone]) on 08/24/2016 8:39:22 PM       Radiology Dg Chest Portable 1 View  Result Date: 08/24/2016 CLINICAL DATA:  Cough, shortness of breath and wheezing for 4 days. EXAM: PORTABLE CHEST 1 VIEW COMPARISON:  Chest radiograph August 24, 2016 at 1145 hours FINDINGS: Patient now rotated to the LEFT, accentuating the LEFT heart. Cardiomediastinal silhouette is normal, mildly calcified aortic knob. Increased lung volumes and mild chronic interstitial changes. Increasing patchy LEFT lung base airspace opacity. Mild blunting of the costophrenic angles. RIGHT midlung zone granuloma. No pneumothorax. Osteopenia. Soft tissue planes are nonsuspicious. IMPRESSION: Increasing LEFT lung base airspace opacity concerning for pneumonia versus atelectasis in a  background of probable COPD. Recommend follow-up PA and lateral views of the chest when clinically able. Small pleural thickening versus pleural thickening. Electronically Signed   By: Awilda Metro M.D.   On: 08/24/2016 15:33   Dg Chest Port 1 View  Result Date: 08/24/2016 CLINICAL DATA:  Chest pain.  Shortness of breath. EXAM: PORTABLE CHEST 1 VIEW COMPARISON:  04/10/2016. FINDINGS: Mediastinum and hilar structures stable. Mild hilar prominence unchanged most likely vascular. Heart size stable. Low lung volumes. Mild bibasilar infiltrates and or edema cannot be completely excluded. Small bilateral pleural effusions versus pleural scarring  noted. No pneumothorax. IMPRESSION: 1. Low lung volumes. Mild bibasilar infiltrates and or edema cannot be excluded. Small bilateral pleural effusions versus pleural scarring noted. 2. Heart size normal. Electronically Signed   By: Maisie Fus  Register   On: 08/24/2016 12:08    Procedures Procedures (including critical care time)  CRITICAL CARE Performed by: Canary Brim Wilbert Hayashi Total critical care time: 35 minutes Critical care time was exclusive of separately billable procedures and treating other patients. Critical care was necessary to treat or prevent imminent or life-threatening deterioration. Critical care was time spent personally by me on the following activities: development of treatment plan with patient and/or surrogate as well as nursing, discussions with consultants, evaluation of patient's response to treatment, examination of patient, obtaining history from patient or surrogate, ordering and performing treatments and interventions, ordering and review of laboratory studies, ordering and review of radiographic studies, pulse oximetry and re-evaluation of patient's condition.    Medications Ordered in ED Medications  0.9 %  sodium chloride infusion (10 mL/hr Intravenous Transfusing/Transfer 08/24/16 1527)  vancomycin (VANCOCIN) 500 mg in  sodium chloride 0.9 % 100 mL IVPB (not administered)  clopidogrel (PLAVIX) tablet 75 mg (not administered)  simvastatin (ZOCOR) tablet 20 mg (not administered)  acetaminophen (TYLENOL) tablet 650 mg (not administered)  ondansetron (ZOFRAN) injection 4 mg (not administered)  ceFEPIme (MAXIPIME) 1 g in dextrose 5 % 50 mL IVPB (not administered)  insulin aspart (novoLOG) injection 0-9 Units (not administered)  diltiazem (CARDIZEM) 100 mg in dextrose 5% (1 mg/mL) infusion (5 mg/hr Intravenous Rate/Dose Change 08/24/16 1900)  ALPRAZolam (XANAX) tablet 0.25 mg (not administered)  aspirin EC tablet 81 mg (not administered)  methylPREDNISolone sodium succinate (SOLU-MEDROL) 40 mg/mL injection 40 mg (not administered)  ipratropium-albuterol (DUONEB) 0.5-2.5 (3) MG/3ML nebulizer solution 3 mL (3 mLs Nebulization Given 08/24/16 2024)  potassium chloride 10 mEq in 100 mL IVPB (not administered)  heparin ADULT infusion 100 units/mL (25000 units/272mL sodium chloride 0.45%) (650 Units/hr Intravenous New Bag/Given 08/24/16 2015)  aspirin chewable tablet 324 mg (243 mg Oral Given 08/24/16 1207)  heparin injection 2,850 Units (2,850 Units Intravenous Given 08/24/16 1201)  sodium chloride 0.9 % bolus 1,000 mL (0 mLs Intravenous Stopped 08/24/16 1330)  ceFEPIme (MAXIPIME) 1 g in dextrose 5 % 50 mL IVPB (0 g Intravenous Stopped 08/24/16 1404)  sodium chloride 0.9 % bolus 1,000 mL (0 mLs Intravenous Stopped 08/24/16 1447)  vancomycin (VANCOCIN) IVPB 1000 mg/200 mL premix (0 mg Intravenous Stopped 08/24/16 1439)  diltiazem (CARDIZEM) 1 mg/mL load via infusion 10 mg (10 mg Intravenous Bolus from Bag 08/24/16 1445)     Initial Impression / Assessment and Plan / ED Course  I have reviewed the triage vital signs and the nursing notes.  Pertinent labs & imaging results that were available during my care of the patient were reviewed by me and considered in my medical decision making (see chart for  details).  Clinical Course  Value Comment By Time  Heparin level (unfractionated) (Reviewed) Canary Brim Hilliary Jock, MD 10/16 2047    Sita Pupo is a 82 y.o. female With a past medical history significant for asthma, hypertension, CAD, And recent diagnosis of pneumonia on levofloxacin who presents with her daughter for shortness of breath, chest pain, Wheezing, and cough.  History and exam are seen above.  On arrival, patient was tachycardic, tachypnea, had oxygen saturation's fluctuating in the upper 80s and low 90s. Patient was started on nasal cannula oxygen. EKG was performed and showed concern for diffuse ST depression  and some ST elevation in AVR. Patient also found to be in a flutter with no history of this. The patient's cardiologist was called, Dr. Jacinto HalimGanji, who looked at the EKG. He did not feel patient was an acute STEMI however, he did recommend administering aspirin and heparin. This was ordered.  Cardiology team requested workup for possible pneumonia given recent pneumonia diagnosis, worsen symptoms, as well as asthma exacerbation. He felt that illness and albuterol use may have caused demand ischemia. They felt if patient .remained tachycardic after fluids, could give metoprolol or diltiazem  Patient had elevated White blood cell count, elevated lactic acid. Patient started on broad-spectrum antibiotics. For hypokalemia, patient given potassium. Troponin was positive.  Patient admitted to hospitalist service for further management of pneumonia and her chest pain in respiratory symptoms. Patient will be seen by cardiology for further management.   Final Clinical Impressions(s) / ED Diagnoses   Final diagnoses:  Elevated troponin  Chest pain, unspecified type  Hypoxia  Shortness of breath  Pneumonia due to infectious organism, unspecified laterality, unspecified part of lung    Clinical Impression: 1. Elevated troponin   2. Chest pain, unspecified type   3. Hypoxia    4. Shortness of breath   5. Pneumonia due to infectious organism, unspecified laterality, unspecified part of lung     Disposition: Admit to Hospitalist service    Heide Scaleshristopher J Jenner Rosier, MD 08/24/16 2052

## 2016-08-24 NOTE — ED Notes (Signed)
PT seen by CCM per karen NP pt okay to go to step down.

## 2016-08-24 NOTE — Progress Notes (Signed)
ANTICOAGULATION CONSULT NOTE - Follow Up Consult  Pharmacy Consult for Heparin Indication: atrial fibrillation  Allergies  Allergen Reactions  . Penicillins Nausea And Vomiting    Patient Measurements: Height: 5' (152.4 cm) Weight: 104 lb (47.2 kg) IBW/kg (Calculated) : 45.5  Vital Signs: Temp: 99 F (37.2 C) (10/16 2045) Temp Source: Axillary (10/16 2045) BP: 115/57 (10/16 2045) Pulse Rate: 97 (10/16 2052)  Labs:  Recent Labs  08/24/16 1136 08/24/16 1644 08/24/16 2129  HGB 14.0  --   --   HCT 41.3  --   --   PLT 210  --   --   APTT 34  --   --   LABPROT 14.0  --   --   INR 1.08  --   --   HEPARINUNFRC  --   --  0.25*  CREATININE 0.99  --   --   TROPONINI 0.04* 0.16*  --     Estimated Creatinine Clearance: 27.1 mL/min (by C-G formula based on SCr of 0.99 mg/dL).   Medications:  Heparin @ 650 units/hr  Assessment: 89yof started on heparin for afib earlier today. Initial heparin level is below goal at 0.25. No bleeding reported.  Goal of Therapy:  Heparin level 0.3-0.7 units/ml Monitor platelets by anticoagulation protocol: Yes   Plan:  1) Increase heparin to 800 units/hr 2) Check 8 hour heparin level  Angelica RiggerMarkle, [name] 08/24/2016,10:23 PM

## 2016-08-24 NOTE — H&P (Signed)
History and Physical    [name] ZOX:[id number] DOB: July 11, 1924 DOA: 08/24/2016  PCP: Darrow Bussing, MD Patient coming from: home  Chief Complaint: chest pain/dypnea  HPI: [name] is a very pleasant 82 y.o. female with medical history significant for COPD/asthma, hypertension, high cholesterol, CAD on Plavix presents to the emergency Department chief complaint of persistent worsening shortness of breath/wheezing and chest pain. Initial evaluation reveals acute respiratory failure with hypoxia likely related to pneumonia in the setting of new A. fib with RVR.  Information is obtained from the patient and the daughter who is at the bedside. Daughter reports 1 week ago patient received a flu shot and "things went downhill from there". Shortly thereafter she developed gradual worsening shortness of breath increased cough increased sputum production increased wheezing. She used her nebulizer and inhaler at home with little relief. 3 days ago she was seen by her PCP had a chest x-ray and was started on Levaquin. She was instructed to come to the emergency department if she did not get better or things got worse. Daughter reports last night patient was "working very hard to breathe". Patient reports developing chest pain last evening. She states the pain located left anterior nonradiating sharp intermittent and worse with coughing. EMS was called and they reported oxygen saturation level 85% on room air. Patient denies any palpitations headache dizziness syncope or near-syncope. She denies abdominal pain nausea vomiting but does endorse decreased oral intake over the last 24 hours. She denies any dysuria hematuria frequency urgency any diarrhea melena bright red blood per rectum. She denies lower extremity edema or orthopnea. She does endorse subjective fever with chills.  ED Course: EMS provided patient with 125 mg of Solu-Medrol 10 mg of albuterol 0.5 mg Atrovent nebulizer. In the emergency  department oxygen saturation level 85% on room air, max temperature 99.6 she is hypertensive with tachypnea heart rate of 140 and EKG consistent with A. fib with RVR. He is provided with IV fluids 10 mg of labetalol with little improvement.  Review of Systems: As per HPI otherwise 10 point review of systems negative.   Ambulatory Status: Ambulates with a cane at home no recent falls  Past Medical History:  Diagnosis Date  . Asthma   . Coronary artery disease   . High cholesterol   . Hypertension     History reviewed. No pertinent surgical history.  Social History   Social History  . Marital status: Widowed    Spouse name: N/A  . Number of children: N/A  . Years of education: N/A   Occupational History  . Not on file.   Social History Main Topics  . Smoking status: Never Smoker  . Smokeless tobacco: Never Used  . Alcohol use No  . Drug use: No  . Sexual activity: Not on file   Other Topics Concern  . Not on file   Social History Narrative  . No narrative on file  Lives at home with her daughter is independent with ADLs  Allergies  Allergen Reactions  . Penicillins Nausea And Vomiting    Family History  Problem Relation Age of Onset  . CAD Mother   . CAD Father     Prior to Admission medications   Medication Sig Start Date End Date Taking? Authorizing Provider  ADVAIR DISKUS 100-50 MCG/DOSE AEPB Inhale 1 puff into the lungs 2 (two) times daily.  08/11/16  Yes Historical Provider, MD  albuterol (PROVENTIL) (2.5 MG/3ML) 0.083% nebulizer solution Take 3 mLs (2.5 mg total)  by nebulization every 6 (six) hours as needed for wheezing or shortness of breath. 04/20/16  Yes Lupita Leashouglas B McQuaid, MD  aspirin 81 MG tablet Take 81 mg by mouth daily.   Yes Historical Provider, MD  Calcium-Magnesium-Zinc [id number] MG TABS Take 1 tablet by mouth daily.   Yes Historical Provider, MD  clopidogrel (PLAVIX) 75 MG tablet Take 75 mg by mouth every other day. Patient takes on Sunday,  Monday, Wednesday, friday   Yes Historical Provider, MD  furosemide (LASIX) 20 MG tablet Take 20 mg by mouth daily as needed.   Yes Historical Provider, MD  levofloxacin (LEVAQUIN) 500 MG tablet Take 500 mg by mouth daily. Started on 08-21-16, for 7 days. On day 3 of therapy   Yes Historical Provider, MD  metoprolol succinate (TOPROL-XL) 25 MG 24 hr tablet Take 25 mg by mouth daily.   Yes Historical Provider, MD  Multiple Vitamins-Minerals (MULTIVITAMIN WITH MINERALS) tablet Take 1 tablet by mouth daily.   Yes Historical Provider, MD  simvastatin (ZOCOR) 20 MG tablet Take 20 mg by mouth daily.   Yes Historical Provider, MD  valsartan (DIOVAN) 80 MG tablet Take 80 mg by mouth daily.   Yes Historical Provider, MD  carvedilol (COREG) 6.25 MG tablet  08/11/16   Historical Provider, MD  nitroGLYCERIN (NITRODUR - DOSED IN MG/24 HR) 0.4 mg/hr patch  08/11/16   Historical Provider, MD    Physical Exam: Vitals:   08/24/16 1500 08/24/16 1515 08/24/16 1525 08/24/16 1530  BP: 123/66 118/62 118/62 123/67  Pulse: 111 108 117 113  Resp: 20 19  24   Temp:      TempSrc:      SpO2: 94% 95%  96%  Weight:      Height:         General:  Appears Slightly anxious but not uncomfortable, requesting food Eyes:  PERRL, EOMI, normal lids, iris ENT:  grossly normal hearing, lips & tongue, because membranes of her mouth are pink slightly dry Neck:  no LAD, masses or thyromegaly Cardiovascular:  Irregularly irregular heart sounds somewhat distant, no m/r/g. No LE edema. Pedal pulses present and palpable Respiratory:  Mild increased work of breathing with conversation. Breath sounds with fair air movement coarse throughout. Rhonchi and expiratory wheezing fine crackles bilateral bases Abdomen:  soft, ntnd, positive bowel sounds no guarding or rebounding Skin:  no rash or induration seen on limited exam Musculoskeletal:  grossly normal tone BUE/BLE, good ROM, no bony abnormality Psychiatric:  grossly normal mood and  affect, speech fluent and appropriate, AOx3 Neurologic:  CN 2-12 grossly intact, moves all extremities in coordinated fashion, sensation intact speech clear facial symmetry  Labs on Admission: I have personally reviewed following labs and imaging studies  CBC:  Recent Labs Lab 08/24/16 1136  WBC 15.2*  NEUTROABS 13.0*  HGB 14.0  HCT 41.3  MCV 86.6  PLT 210   Basic Metabolic Panel:  Recent Labs Lab 08/24/16 1136  NA 136  K 2.9*  CL 96*  CO2 24  GLUCOSE 169*  BUN 20  CREATININE 0.99  CALCIUM 9.4   GFR: Estimated Creatinine Clearance: 24.4 mL/min (by C-G formula based on SCr of 0.99 mg/dL). Liver Function Tests:  Recent Labs Lab 08/24/16 1136  AST 38  ALT 24  ALKPHOS 61  BILITOT 0.6  PROT 7.3  ALBUMIN 3.4*   No results for input(s): LIPASE, AMYLASE in the last 168 hours. No results for input(s): AMMONIA in the last 168 hours. Coagulation Profile:  Recent Labs  Lab 08/24/16 1136  INR 1.08   Cardiac Enzymes:  Recent Labs Lab 08/24/16 1136  TROPONINI 0.04*   BNP (last 3 results) No results for input(s): PROBNP in the last 8760 hours. HbA1C: No results for input(s): HGBA1C in the last 72 hours. CBG: No results for input(s): GLUCAP in the last 168 hours. Lipid Profile:  Recent Labs  08/24/16 1136  CHOL 163  HDL 93  LDLCALC 56  TRIG 68  CHOLHDL 1.8   Thyroid Function Tests: No results for input(s): TSH, T4TOTAL, FREET4, T3FREE, THYROIDAB in the last 72 hours. Anemia Panel: No results for input(s): VITAMINB12, FOLATE, FERRITIN, TIBC, IRON, RETICCTPCT in the last 72 hours. Urine analysis:    Component Value Date/Time   COLORURINE AMBER (A) 08/24/2016 1415   APPEARANCEUR CLEAR 08/24/2016 1415   LABSPEC 1.020 08/24/2016 1415   PHURINE 6.5 08/24/2016 1415   GLUCOSEU 100 (A) 08/24/2016 1415   HGBUR LARGE (A) 08/24/2016 1415   BILIRUBINUR NEGATIVE 08/24/2016 1415   KETONESUR 40 (A) 08/24/2016 1415   PROTEINUR 100 (A) 08/24/2016 1415    NITRITE NEGATIVE 08/24/2016 1415   LEUKOCYTESUR NEGATIVE 08/24/2016 1415    Creatinine Clearance: Estimated Creatinine Clearance: 24.4 mL/min (by C-G formula based on SCr of 0.99 mg/dL).  Sepsis Labs: @LABRCNTIP (procalcitonin:4,lacticidven:4) )No results found for this or any previous visit (from the past 240 hour(s)).   Radiological Exams on Admission: Dg Chest Portable 1 View  Result Date: 08/24/2016 CLINICAL DATA:  Cough, shortness of breath and wheezing for 4 days. EXAM: PORTABLE CHEST 1 VIEW COMPARISON:  Chest radiograph August 24, 2016 at 1145 hours FINDINGS: Patient now rotated to the LEFT, accentuating the LEFT heart. Cardiomediastinal silhouette is normal, mildly calcified aortic knob. Increased lung volumes and mild chronic interstitial changes. Increasing patchy LEFT lung base airspace opacity. Mild blunting of the costophrenic angles. RIGHT midlung zone granuloma. No pneumothorax. Osteopenia. Soft tissue planes are nonsuspicious. IMPRESSION: Increasing LEFT lung base airspace opacity concerning for pneumonia versus atelectasis in a background of probable COPD. Recommend follow-up PA and lateral views of the chest when clinically able. Small pleural thickening versus pleural thickening. Electronically Signed   By: Awilda Metro M.D.   On: 08/24/2016 15:33   Dg Chest Port 1 View  Result Date: 08/24/2016 CLINICAL DATA:  Chest pain.  Shortness of breath. EXAM: PORTABLE CHEST 1 VIEW COMPARISON:  04/10/2016. FINDINGS: Mediastinum and hilar structures stable. Mild hilar prominence unchanged most likely vascular. Heart size stable. Low lung volumes. Mild bibasilar infiltrates and or edema cannot be completely excluded. Small bilateral pleural effusions versus pleural scarring noted. No pneumothorax. IMPRESSION: 1. Low lung volumes. Mild bibasilar infiltrates and or edema cannot be excluded. Small bilateral pleural effusions versus pleural scarring noted. 2. Heart size normal.  Electronically Signed   By: Maisie Fus  Register   On: 08/24/2016 12:08    EKG: Independently reviewed. Atrial flutter Ventricular premature complex Repolarization abnormality, prob rate related  Assessment/Plan Principal Problem:   Acute respiratory failure with hypoxia (HCC) Active Problems:   Dyspnea   Chronic obstructive airway disease with asthma (HCC)   CAP (community acquired pneumonia)   New onset a-fib (HCC)   Hyperglycemia   Chest pain   Elevated troponin   Hypokalemia   Sepsis (HCC)    1.Sepsis. Elevated lactic acid, fever, leukocytosis with shift, tachypnea, acute respiratory failure related to cap.  -Admit to step down -Antibiotics per protocol -Follow blood cultures -track lactic acid -judicious IV fluids   #2. Acute respiratory failure with hypoxia. Likely  multifactorial related to pneumonia and A. fib with RVR in the setting of mild COPD exacerbation. Oxygen saturation level 85% on room air. Chest x-ray shows mild by basilar infiltrates and/or edema as well as small bilateral pleural effusion. She is provided with Solu-Medrol and nebulizers and oxygen supplementation in the emergency department. Oxygen saturation level 91% on 2 L nasal cannula. While waiting for SD bed she developed respiratory distress and became somnolent. BiPap provided  -Antibiotics per protocol -continue BiPap -Follow blood cultures -Obtain sputum culture as able -Strep pneumo urine antigen -Antitussives -Nebulizers every 4 hours -Continue low-dose Solu-Medrol 3 -ABG -Monitor oxygen saturation level -CCM consult  #3. A. fib with RVR. chadvasc score 5. Chart review reveals no documentation of history of same. However patient reports she's been told she has "irregular heart rhythm". EKG as noted above. He was provided with 10 mg of metoprolol IV in the emergency department as well as fluids with little improvement. Home meds include valsartan and BB. Dr. Jacinto Halim consulted who opines likely  related to infectious process.  -dilt gtt -cardiology consult -echo -anticoagulation per cards -received heparin in ED  #4. Chest pain/elevated troponin. Initial troponin 0.04. History CAD. EKG as noted above. EKG reviewed with Dr Jacinto Halim per ED physician. Cards opined no STEMI likely related to demand and recommended IV fluids and treatment of pneumonia. -See #2. -Cycle troponin -Serial EKG -She was given 2000 units of heparin in the emergency department -Cardiology consult -Continue Plavix -She was pain-free added time of admission  #5. Community-acquired pneumonia. Was on Levaquin 3 days prior to admission. Lactic acid elevated. Low-grade fever. Leukocytosis. -Antibiotics for healthcare associated given that failed outpatient -IV fluids -Follow blood cultures -Obtain sputum culture -Supportive therapy  #6. COPD/asthma. History of same. Has seen pulmonology in the past. Has nebulizer and inhaler at home. Not been hospitalized for exacerbation in many years. Chest x-ray as noted above. Wheezing and rhonchi on exam. -Continue Solu-Medrol -Scheduled nebs -Home inhaler -Antibiotics as noted above -Wean oxygen as able. Patient is not on oxygen at home  #6. Hyperglycemia. Serum glucose 169 on admission. No history of diabetes. May be related to steroid use recently. -Obtain a hemoglobin A1c -Sliding scale insulin as indicated -Expect continued elevation given steroid  #7. Hypokalemia. Potassium 2.9. Related to Lasix on home meds -Replete -Recheck -Check magnesium level -For to keep potassium close to 4.0    DVT prophylaxis: heparin in ED Code Status: full  Family Communication: daughter at bedside  Disposition Plan: home  Consults called: ganji  Admission status: obs    Gwenyth Bender MD [facility]  If 7PM-7AM, please contact night-coverage [email or web address] Password [email or web address]  08/24/2016, 4:02 PM

## 2016-08-24 NOTE — ED Notes (Signed)
Attempted to call report

## 2016-08-24 NOTE — Progress Notes (Signed)
ANTICOAGULATION CONSULT NOTE - Initial Consult  Pharmacy Consult for heparin Indication: atrial fibrillation  Allergies  Allergen Reactions  . Penicillins Nausea And Vomiting    Patient Measurements: Height: 4\' 10"  (147.3 cm) Weight: 104 lb (47.2 kg) IBW/kg (Calculated) : 40.9 Heparin Dosing Weight: 47.2 kg  Vital Signs: Temp: 99.6 F (37.6 C) (10/16 1126) Temp Source: Oral (10/16 1126) BP: 152/89 (10/16 1330) Pulse Rate: 133 (10/16 1330)  Labs:  Recent Labs  08/24/16 1136  HGB 14.0  HCT 41.3  PLT 210  APTT 34  LABPROT 14.0  INR 1.08  CREATININE 0.99  TROPONINI 0.04*    Estimated Creatinine Clearance: 24.4 mL/min (by C-G formula based on SCr of 0.99 mg/dL).   Medical History: Past Medical History:  Diagnosis Date  . Asthma   . Coronary artery disease   . High cholesterol   . Hypertension     Medications:   (Not in a hospital admission)  Assessment: Pt has new onset afib. No reported bleeding. H/H and Plt WNL. Pt was loaded at 60 units/kg by MD order. Pt home meds include clopidogrel 75 mg every other day. PT, INR, aPTT WNL.  Goal of Therapy:  Heparin level 0.3-0.7 units/ml Monitor platelets by anticoagulation protocol: Yes   Plan:  Start heparin infusion at 650 units/hr. Heparin level at steady state Daily CBC and Heparin level  Floy Riegler R Telisha Zawadzki 08/24/2016,1:50 PM

## 2016-08-25 ENCOUNTER — Other Ambulatory Visit (HOSPITAL_COMMUNITY): Payer: Medicare Other

## 2016-08-25 DIAGNOSIS — R0902 Hypoxemia: Secondary | ICD-10-CM

## 2016-08-25 LAB — BASIC METABOLIC PANEL
Anion gap: 10 (ref 5–15)
BUN: 14 mg/dL (ref 6–20)
CALCIUM: 8.4 mg/dL — AB (ref 8.9–10.3)
CO2: 18 mmol/L — AB (ref 22–32)
CREATININE: 0.78 mg/dL (ref 0.44–1.00)
Chloride: 107 mmol/L (ref 101–111)
GFR calc non Af Amer: >60 mL/min (ref >60)
GLUCOSE: 175 mg/dL — AB (ref 65–99)
Potassium: 4.7 mmol/L (ref 3.5–5.1)
Sodium: 135 mmol/L (ref 135–145)

## 2016-08-25 LAB — LIPID PANEL
CHOL/HDL RATIO: 1.8 ratio
Cholesterol: 134 mg/dL (ref 0–200)
HDL: 74 mg/dL (ref >40)
LDL CALC: 50 mg/dL (ref 0–99)
Triglycerides: 48 mg/dL (ref <150)
VLDL: 10 mg/dL (ref 0–40)

## 2016-08-25 LAB — CBC
HCT: 35.3 % — ABNORMAL LOW (ref 36.0–46.0)
Hemoglobin: 11.7 g/dL — ABNORMAL LOW (ref 12.0–15.0)
MCH: 29 pg (ref 26.0–34.0)
MCHC: 33.1 g/dL (ref 30.0–36.0)
MCV: 87.6 fL (ref 78.0–100.0)
PLATELETS: 170 10*3/uL (ref 150–400)
RBC: 4.03 MIL/uL (ref 3.87–5.11)
RDW: 13.8 % (ref 11.5–15.5)
WBC: 12.5 10*3/uL — ABNORMAL HIGH (ref 4.0–10.5)

## 2016-08-25 LAB — HEPARIN LEVEL (UNFRACTIONATED): Heparin Unfractionated: 0.52 IU/mL (ref 0.30–0.70)

## 2016-08-25 LAB — HEMOGLOBIN A1C
HEMOGLOBIN A1C: 5.8 % — AB (ref 4.8–5.6)
Mean Plasma Glucose: 120 mg/dL

## 2016-08-25 LAB — URINE CULTURE: CULTURE: NO GROWTH

## 2016-08-25 LAB — GLUCOSE, CAPILLARY
Glucose-Capillary: 160 mg/dL — ABNORMAL HIGH (ref 65–99)
Glucose-Capillary: 172 mg/dL — ABNORMAL HIGH (ref 65–99)
Glucose-Capillary: 196 mg/dL — ABNORMAL HIGH (ref 65–99)
Glucose-Capillary: 169 mg/dL — ABNORMAL HIGH (ref 65–99)

## 2016-08-25 LAB — MRSA PCR SCREENING: MRSA BY PCR: NEGATIVE

## 2016-08-25 MED ORDER — DILTIAZEM HCL 30 MG PO TABS
30.0000 mg | ORAL_TABLET | Freq: Four times a day (QID) | ORAL | Status: DC
  Administered 2016-08-25 – 2016-08-26 (×4): 30 mg via ORAL
  Filled 2016-08-25 (×4): qty 1

## 2016-08-25 MED ORDER — SODIUM CHLORIDE 3 % IN NEBU
4.0000 mL | INHALATION_SOLUTION | Freq: Every day | RESPIRATORY_TRACT | Status: DC
  Administered 2016-08-26 – 2016-08-27 (×2): 4 mL via RESPIRATORY_TRACT
  Filled 2016-08-25 (×3): qty 4

## 2016-08-25 MED ORDER — GUAIFENESIN ER 600 MG PO TB12
600.0000 mg | ORAL_TABLET | Freq: Two times a day (BID) | ORAL | Status: DC
  Administered 2016-08-25 – 2016-09-01 (×15): 600 mg via ORAL
  Filled 2016-08-25 (×15): qty 1

## 2016-08-25 MED ORDER — IPRATROPIUM-ALBUTEROL 0.5-2.5 (3) MG/3ML IN SOLN
3.0000 mL | Freq: Three times a day (TID) | RESPIRATORY_TRACT | Status: DC
  Administered 2016-08-26 – 2016-09-01 (×16): 3 mL via RESPIRATORY_TRACT
  Filled 2016-08-25 (×16): qty 3

## 2016-08-25 MED ORDER — ATORVASTATIN CALCIUM 10 MG PO TABS
10.0000 mg | ORAL_TABLET | Freq: Every day | ORAL | Status: DC
  Administered 2016-08-26 – 2016-09-01 (×7): 10 mg via ORAL
  Filled 2016-08-25 (×7): qty 1

## 2016-08-25 MED ORDER — DILTIAZEM HCL ER COATED BEADS 120 MG PO CP24: 120.0000 mg | ORAL_CAPSULE | Freq: Every day | ORAL | Status: DC

## 2016-08-25 NOTE — Progress Notes (Signed)
Subjective:  She is much more improved with regard to dyspnea denies any chest pain. Daughter present at the bedside.  Still continues to have severe cough but is unproductive.  At present overall feels well.  Objective:  Vital Signs in the last 24 hours: Temp:  [98.2 F (36.8 C)-99 F (37.2 C)] 98.2 F (36.8 C) (10/17 1351) Pulse Rate:  [66-97] 87 (10/17 1734) Resp:  [14-23] 16 (10/17 1734) BP: (103-115)/(54-76) 103/67 (10/17 1734) SpO2:  [94 %-98 %] 95 % (10/17 1734) FiO2 (%):  [40 %] 40 % (10/16 2024) Weight:  [51.9 kg (114 lb 6.4 oz)] 51.9 kg (114 lb 6.4 oz) (10/17 0516)   Blood pressure 103/67, pulse 87, temperature 98.2 F (36.8 C), temperature source Oral, resp. rate 16, height 5' (1.524 m), weight 51.9 kg (114 lb 6.4 oz), SpO2 95 %.   Intake/Output from previous day: 10/16 0701 - 10/17 0700 In: 2058 [P.O.:240; I.V.:68; IV Piggyback:1750] Out: 150 [Urine:150]  Physical Exam:   General appearance: alert, cooperative, appears stated age, no distress and frail Eyes: negative findings: lids and lashes normal Neck: no adenopathy, no carotid bruit, no JVD, supple, symmetrical, trachea midline and thyroid not enlarged, symmetric, no tenderness/mass/nodules Neck: JVP - normal, carotids 2+= without bruits Resp: rales bilaterally and extensive Chest wall: no tenderness Cardio: regular rate and rhythm, S1, S2 normal, no murmur, click, rub or gallop and distant heart sounds GI: soft, non-tender; bowel sounds normal; no masses,  no organomegaly Extremities: edema 1 plus pitting. FROM. No tenderness    Lab Results: BMP  Recent Labs  08/24/16 1136 08/25/16 0557  NA 136 135  K 2.9* 4.7  CL 96* 107  CO2 24 18*  GLUCOSE 169* 175*  BUN 20 14  CREATININE 0.99 0.78  CALCIUM 9.4 8.4*  GFRNONAA 49* >60  GFRAA 56* >60    CBC  Recent Labs Lab 08/24/16 1136 08/25/16 0557  WBC 15.2* 12.5*  RBC 4.77 4.03  HGB 14.0 11.7*  HCT 41.3 35.3*  PLT 210 170  MCV 86.6 87.6   MCH 29.4 29.0  MCHC 33.9 33.1  RDW 13.3 13.8  LYMPHSABS 1.2  --   MONOABS 0.9  --   EOSABS 0.0  --   BASOSABS 0.0  --     HEMOGLOBIN A1C Lab Results  Component Value Date   HGBA1C 5.8 (H) 08/24/2016   MPG 120 08/24/2016    Cardiac Panel (last 3 results)  Recent Labs  08/24/16 1136 08/24/16 1644 08/24/16 2129  TROPONINI 0.04* 0.16* 0.65*   Recent Labs  08/24/16 1644  TSH 1.201    Recent Labs  08/24/16 1136  PROT 7.3  ALBUMIN 3.4*  AST 38  ALT 24  ALKPHOS 61  BILITOT 0.6    Imaging: Imaging results have been reviewed  Cardiac Studies:  EKG: 08/24/2016: Atrial flutter with 2:1 conduction at the rate of 130 bpm. Diffuse inferior and lateral ST segment depression with T-wave inversion suggestive of ischemia.  Echo 04/15/2016(office):  Normal LVEF by echocardiogram, mild diastolic dysfunction, no significant valvular abnormality, mild MR and TR.  Telemetry:Patient had already converted to sinus rhythm as of yesterday On 10/60/2017 with intermittent episodes of atrial flutter.  This morning maintains normal sinus rhythm.  Scheduled Meds: . aspirin EC  81 mg Oral Daily  . ceFEPime (MAXIPIME) IV  1 g Intravenous Q24H  . clopidogrel  75 mg Oral QODAY  . diltiazem  30 mg Oral Q6H  . guaiFENesin  600 mg Oral BID  .  insulin aspart  0-9 Units Subcutaneous TID WC  . ipratropium-albuterol  3 mL Nebulization Q6H WA  . methylPREDNISolone (SOLU-MEDROL) injection  40 mg Intravenous Q12H  . simvastatin  20 mg Oral Daily  . sodium chloride HYPERTONIC  4 mL Nebulization Daily  . vancomycin  500 mg Intravenous Q24H   Continuous Infusions: . sodium chloride 20 mL/hr (08/24/16 1331)  . heparin 800 Units/hr (08/24/16 2230)   PRN Meds:.acetaminophen, ALPRAZolam, ondansetron (ZOFRAN) IV   Assessment/Plan:  1. Respiratory distress due to PNA. 2. New A. Flutter with RVR due to respiratory distress. Resolved immediately within few hours.  CHA2DS2-VASCScore: Risk Score  5,   Yearly risk of stroke  6.7. Recommendation: Anticoagulation   3. CAD native vessel with angina pectoris due to demand ischemia from #1 & #2. Do not suspect ACS primarily. Coronary angiogram  05/17/2013 [facility], [place]) : Left main: 40% distal lesion, 30-40% lesion in ostium. LAD: Heavily calcified and 70% occluded proximally, fills retrograde by the left circumflex. Left circumflex: Luminal irregularities, torturous. RCA: Large, dominant, 50-70% stenosis in the mid segment and in the distal segment. LVEF 60-65% 4. Chronic diastolic heart failure.  5. History of bronchial asthma  Recommendation:from cardiac standpoint she remained stable.  No specific recommendation for mildly regard, continue  Aspirin And diltiazem for now due to significant wheezing, beta blockers are relatively contraindicated. No cardiac studies are planned,I will see her back from the sidelines.  Please call if questions. Daughter present at the bedside. No indication for long Term anticoagulation for now  Yates Decamp, M.D. 08/25/2016, 6:56 PM [facility] Pager: [phone] Office: [phone] If no answer: [phone]

## 2016-08-25 NOTE — Progress Notes (Signed)
Tech received phone call that stated daughter was ready for Pt to get a bath. Tech arrives in Pt's room with bath supplies and proceeds to start bath. Daughter instructs tech to get water hot and only use water no soap. Tech explained to daughter that the hospital's water doesn't get as hot as the water at home, but assured daughter the water would be appropriate temperature  for a bath.

## 2016-08-25 NOTE — Progress Notes (Signed)
PROGRESS NOTE    [name]  ZOX:[id number] DOB: [birth year] DOA: 08/24/2016 PCP: Darrow Bussing, MD   Brief Narrative:  [name] is a very pleasant 82 y.o. female with medical history significant for COPD/asthma, hypertension, high cholesterol, CAD on Plavix presents to the emergency Department chief complaint of persistent worsening shortness of breath/wheezing and chest pain. Initial evaluation reveals acute respiratory failure with hypoxia likely related to pneumonia complicated by COPD in the setting of new A. fib with RVR.  Assessment & Plan:   Principal Problem:   Acute respiratory failure with hypoxia (HCC) Active Problems:   Dyspnea   Chronic obstructive airway disease with asthma (HCC)   CAP (community acquired pneumonia)   New onset a-fib (HCC)   Hyperglycemia   Chest pain   Elevated troponin   Hypokalemia   Sepsis (HCC)   1. Acute Hypoxic Resipiratory Failure 2/2 to Community Acquired Left Base PNA along with AECOPD complicated by Atrial Fibrillation with RVR, poA requiring NIPPV -ABG showed 7.390/37.7/114/0/24/98% -Patient Transitioned to O2 via [place] -Continous Pulse Oximetry -Maintain O2 Sats >92% via [place]  2. Sepsis 2/2 to Community Acquired Left Base PNA with compounded AECOPD, improving -Fluid Bolused in ED -WBC decreasing from 15.2 -> 12.5; Repeat CBC in AM -C/w Gauifenesin 600 mg po BID -C/w DuoNeb q6hm, Hypertonic Saline Nebs -Sputum Cx and Gram Stain Pending -Lactic Acid 1.71 -C/w IV Vanc and Cefepime -CXR showed Increasing LEFT lung base airspace opacity concerning for pneumonia versus atelectasis in a background of probable COPD. Recommend follow-up PA and lateral views of the chest when clinically able. Small pleural thickening versus pleural thickening. -C/w NS IVF -Repeat CXR in AM  3. Acute Exacerbation of COPD -As in#2  4. Atrial Fibrillation with RVR s/p Conversion to NSR -TSH was 1.201 -Elevated CHADS2VASC of 5 -Cardizem gtt  D/C'd and transitioned to Diltiazem 30 mg q6h -Appreciate Cardiology Recc's -Obtain TTE (pending)  5. Hypokalemia, improved -K+ was 4.7  6. Elevated Troponin likely 2/2 to Demand Ischemia -Cards Consult Dr. Jacinto Halim Appreciated -Does not feel this is ACS -Given 324 mg of ASA -C/w ASA 81 mg, Clopidogrel 75 mg and with Simvastatin 20 mg -Avoiding BB because of Respiratory Issues  7. Hyperglycemia -Possibly 2/2 to Solumedrol  -Continue to Monitor; on SSI -HbA1x   8. Hx of Chronic Diastolic Dysfunction with EF of 60-65% -Currently not in Exacerbation -Strict Is and O's and Daily Weights -Maintain BP Control   DVT prophylaxis: Heparin gtt Code Status:  FULL (family to discuss Code Status among themselves and come up with Decision) Family Communication: Discussed the Plan of care with Patient's Daughter at bedside. Disposition Plan:  Home with Home Health Vs. SNF.  Consultants:   Pulmonary  Cardiology   Procedures: None  Antimicrobials: Vanc and Cefepime  Subjective: Seen and examined bedside and improved respiratory status. Deferred to her daughter for talking. No other complaints or concerns and denies CP/N/V or Abdominal Pain.   Objective: Vitals:   08/25/16 1152 08/25/16 1344 08/25/16 1351 08/25/16 1734  BP: 107/62  (!) 105/54 103/67  Pulse: 86 81 87 87  Resp: (!) 23 18 16 16   Temp:   98.2 F (36.8 C)   TempSrc:   Oral   SpO2: 97% 98% 96% 95%  Weight:      Height:        Intake/Output Summary (Last 24 hours) at 08/25/16 1839 Last data filed at 08/25/16 0900  Gross per 24 hour  Intake  548 ml  Output              150 ml  Net              398 ml   Filed Weights   08/24/16 1127 08/25/16 0516  Weight: 47.2 kg (104 lb) 51.9 kg (114 lb 6.4 oz)   Examination: Physical Exam:  Constitutional: Thin elderly female and appears calm and comfortable Eyes:  Lids and conjunctivae normal, sclerae anicteric  ENMT: External Ears, Nose appear normal.  Grossly normal hearing.  Neck: Appears normal, supple, no cervical masses, normal ROM, no appreciable thyromegaly, no appreciable JVD Respiratory: Diminished with wheezing. No crackles. Normal respiratory effort and patient is not tachypenic. No accessory muscle use.  Cardiovascular: RRR, no murmurs / rubs / gallops. S1 and S2 auscultated. No extremity edema. Abdomen: Soft, non-tender, non-distended. No masses palpated. No appreciable hepatosplenomegaly. Bowel sounds positive.  GU: Deferred. Musculoskeletal: No clubbing / cyanosis of digits/nails. No joint deformity upper and lower extremities. No contractures.   Skin: No rashes, lesions, ulcers. No induration; Warm and dry.  Neurologic: CN 2-12 grossly intact with no focal deficits. Sensation intact in all 4 Extremities. Romberg sign cerebellar reflexes not assessed.  Psychiatric: Normal judgment and insight. Alert and oriented x 3. Normal mood and pleasant mood and appropriate affect.   Data Reviewed: I have personally reviewed following labs and imaging studies  CBC:  Recent Labs Lab 08/24/16 1136 08/25/16 0557  WBC 15.2* 12.5*  NEUTROABS 13.0*  --   HGB 14.0 11.7*  HCT 41.3 35.3*  MCV 86.6 87.6  PLT 210 170   Basic Metabolic Panel:  Recent Labs Lab 08/24/16 1136 08/24/16 1644 08/25/16 0557  NA 136  --  135  K 2.9*  --  4.7  CL 96*  --  107  CO2 24  --  18*  GLUCOSE 169*  --  175*  BUN 20  --  14  CREATININE 0.99  --  0.78  CALCIUM 9.4  --  8.4*  MG  --  2.1  --    GFR: Estimated Creatinine Clearance: 33.6 mL/min (by C-G formula based on SCr of 0.78 mg/dL). Liver Function Tests:  Recent Labs Lab 08/24/16 1136  AST 38  ALT 24  ALKPHOS 61  BILITOT 0.6  PROT 7.3  ALBUMIN 3.4*   No results for input(s): LIPASE, AMYLASE in the last 168 hours. No results for input(s): AMMONIA in the last 168 hours. Coagulation Profile:  Recent Labs Lab 08/24/16 1136  INR 1.08   Cardiac Enzymes:  Recent Labs Lab  08/24/16 1136 08/24/16 1644 08/24/16 2129  TROPONINI 0.04* 0.16* 0.65*   BNP (last 3 results) No results for input(s): PROBNP in the last 8760 hours. HbA1C:  Recent Labs  08/24/16 1335  HGBA1C 5.8*   CBG:  Recent Labs Lab 08/24/16 2050 08/25/16 0737 08/25/16 1148 08/25/16 1641  GLUCAP 206* 160* 172* 196*   Lipid Profile:  Recent Labs  08/24/16 1136 08/25/16 0557  CHOL 163 134  HDL 93 74  LDLCALC 56 50  TRIG 68 48  CHOLHDL 1.8 1.8   Thyroid Function Tests:  Recent Labs  08/24/16 1644  TSH 1.201   Anemia Panel: No results for input(s): VITAMINB12, FOLATE, FERRITIN, TIBC, IRON, RETICCTPCT in the last 72 hours. Sepsis Labs:  Recent Labs Lab 08/24/16 1207 08/24/16 1645 08/24/16 1712  LATICACIDVEN 3.77* 1.7 1.71    Recent Results (from the past 240 hour(s))  Culture, blood (Routine x 2)  Status: None (Preliminary result)   Collection Time: 08/24/16 11:50 AM  Result Value Ref Range Status   Specimen Description BLOOD RIGHT FOREARM  Final   Special Requests   Final    BOTTLES DRAWN AEROBIC AND ANAEROBIC 4CC AER 5CC ANA   Culture NO GROWTH 1 DAY  Final   Report Status PENDING  Incomplete  Culture, blood (Routine x 2)     Status: None (Preliminary result)   Collection Time: 08/24/16  2:07 PM  Result Value Ref Range Status   Specimen Description BLOOD LEFT WRIST  Final   Special Requests BOTTLES DRAWN AEROBIC ONLY 5CC  Final   Culture NO GROWTH 1 DAY  Final   Report Status PENDING  Incomplete  Urine culture     Status: None   Collection Time: 08/24/16  2:15 PM  Result Value Ref Range Status   Specimen Description URINE, RANDOM  Final   Special Requests NONE  Final   Culture NO GROWTH  Final   Report Status 08/25/2016 FINAL  Final  MRSA PCR Screening     Status: None   Collection Time: 08/25/16  8:40 AM  Result Value Ref Range Status   MRSA by PCR NEGATIVE NEGATIVE Final    Comment:        The GeneXpert MRSA Assay (FDA approved for NASAL  specimens only), is one component of a comprehensive MRSA colonization surveillance program. It is not intended to diagnose MRSA infection nor to guide or monitor treatment for MRSA infections.     Radiology Studies: Dg Chest Portable 1 View  Result Date: 08/24/2016 CLINICAL DATA:  Cough, shortness of breath and wheezing for 4 days. EXAM: PORTABLE CHEST 1 VIEW COMPARISON:  Chest radiograph August 24, 2016 at 1145 hours FINDINGS: Patient now rotated to the LEFT, accentuating the LEFT heart. Cardiomediastinal silhouette is normal, mildly calcified aortic knob. Increased lung volumes and mild chronic interstitial changes. Increasing patchy LEFT lung base airspace opacity. Mild blunting of the costophrenic angles. RIGHT midlung zone granuloma. No pneumothorax. Osteopenia. Soft tissue planes are nonsuspicious. IMPRESSION: Increasing LEFT lung base airspace opacity concerning for pneumonia versus atelectasis in a background of probable COPD. Recommend follow-up PA and lateral views of the chest when clinically able. Small pleural thickening versus pleural thickening. Electronically Signed   By: Awilda Metro M.D.   On: 08/24/2016 15:33   Dg Chest Port 1 View  Result Date: 08/24/2016 CLINICAL DATA:  Chest pain.  Shortness of breath. EXAM: PORTABLE CHEST 1 VIEW COMPARISON:  04/10/2016. FINDINGS: Mediastinum and hilar structures stable. Mild hilar prominence unchanged most likely vascular. Heart size stable. Low lung volumes. Mild bibasilar infiltrates and or edema cannot be completely excluded. Small bilateral pleural effusions versus pleural scarring noted. No pneumothorax. IMPRESSION: 1. Low lung volumes. Mild bibasilar infiltrates and or edema cannot be excluded. Small bilateral pleural effusions versus pleural scarring noted. 2. Heart size normal. Electronically Signed   By: Maisie Fus  Register   On: 08/24/2016 12:08   Scheduled Meds: . aspirin EC  81 mg Oral Daily  . ceFEPime (MAXIPIME) IV  1 g  Intravenous Q24H  . clopidogrel  75 mg Oral QODAY  . diltiazem  30 mg Oral Q6H  . guaiFENesin  600 mg Oral BID  . insulin aspart  0-9 Units Subcutaneous TID WC  . ipratropium-albuterol  3 mL Nebulization Q6H WA  . methylPREDNISolone (SOLU-MEDROL) injection  40 mg Intravenous Q12H  . simvastatin  20 mg Oral Daily  . sodium chloride HYPERTONIC  4  mL Nebulization Daily  . vancomycin  500 mg Intravenous Q24H   Continuous Infusions: . sodium chloride 20 mL/hr (08/24/16 1331)  . heparin 800 Units/hr (08/24/16 2230)     LOS: 1 day   Merlene Laughter, DO [facility] Pager [phone]  If 7PM-7AM, please contact night-coverage [email or web address] Password [facility] 08/25/2016, 6:39 PM

## 2016-08-25 NOTE — Progress Notes (Addendum)
ANTICOAGULATION CONSULT NOTE - Follow Up Consult  Pharmacy Consult for Heparin Indication: atrial fibrillation  Allergies  Allergen Reactions  . Penicillins Nausea And Vomiting   Assessment: 89yof started on heparin for afib 10/16. Heparin levels now at goal this am. No bleeding reported. Hgb is down this am from 14 on admit to 11.7 this am, will continue to follow.  Goal of Therapy:  Heparin level 0.3-0.7 units/ml Monitor platelets by anticoagulation protocol: Yes   Plan:  1) Continue heparin at 800 units/hr 2) Check HL/CBC daily   Patient Measurements: Height: 5' (152.4 cm) Weight: 114 lb 6.4 oz (51.9 kg) IBW/kg (Calculated) : 45.5  Vital Signs: Temp: 98.7 F (37.1 C) (10/17 0516) Temp Source: Axillary (10/17 0516) BP: 115/63 (10/17 0516) Pulse Rate: 70 (10/17 0516)  Labs:  Recent Labs  08/24/16 1136 08/24/16 1644 08/24/16 2129 08/25/16 0557 08/25/16 0633  HGB 14.0  --   --  11.7*  --   HCT 41.3  --   --  35.3*  --   PLT 210  --   --  170  --   APTT 34  --   --   --   --   LABPROT 14.0  --   --   --   --   INR 1.08  --   --   --   --   HEPARINUNFRC  --   --  0.25*  --  0.52  CREATININE 0.99  --   --  0.78  --   TROPONINI 0.04* 0.16* 0.65*  --   --     Estimated Creatinine Clearance: 33.6 mL/min (by C-G formula based on SCr of 0.78 mg/dL).   Medications:  Heparin @ 650 units/hr  Sheppard CoilFrank Jonise Weightman PharmD., BCPS Clinical Pharmacist Pager [phone] 08/25/2016 8:44 AM

## 2016-08-25 NOTE — Progress Notes (Signed)
Patient remains on 4 lpm [place] resting well. No distress noted. Per family member at bedside and RN patient has no SOB.

## 2016-08-25 NOTE — Progress Notes (Signed)
Pt remains on 2 LPM [place] at this time and is tolerating well.  Pt in no noted distress.  RT will hold BIPAP at this time.  RT to monitor and assess as needed.

## 2016-08-26 ENCOUNTER — Inpatient Hospital Stay (HOSPITAL_COMMUNITY): Payer: Medicare Other

## 2016-08-26 LAB — GLUCOSE, CAPILLARY
GLUCOSE-CAPILLARY: 173 mg/dL — AB (ref 65–99)
GLUCOSE-CAPILLARY: 152 mg/dL — AB (ref 65–99)
Glucose-Capillary: 160 mg/dL — ABNORMAL HIGH (ref 65–99)
Glucose-Capillary: 196 mg/dL — ABNORMAL HIGH (ref 65–99)

## 2016-08-26 LAB — CBC
HEMATOCRIT: 36 % (ref 36.0–46.0)
Hemoglobin: 12 g/dL (ref 12.0–15.0)
MCH: 28.8 pg (ref 26.0–34.0)
MCHC: 33.3 g/dL (ref 30.0–36.0)
MCV: 86.3 fL (ref 78.0–100.0)
PLATELETS: 206 10*3/uL (ref 150–400)
RBC: 4.17 MIL/uL (ref 3.87–5.11)
RDW: 13.8 % (ref 11.5–15.5)
WBC: 16.9 10*3/uL — ABNORMAL HIGH (ref 4.0–10.5)

## 2016-08-26 LAB — COMPREHENSIVE METABOLIC PANEL
ALT: 53 U/L (ref 14–54)
AST: 63 U/L — AB (ref 15–41)
Albumin: 2.5 g/dL — ABNORMAL LOW (ref 3.5–5.0)
Alkaline Phosphatase: 49 U/L (ref 38–126)
Anion gap: 7 (ref 5–15)
BUN: 23 mg/dL — ABNORMAL HIGH (ref 6–20)
CHLORIDE: 106 mmol/L (ref 101–111)
CO2: 23 mmol/L (ref 22–32)
Calcium: 8.5 mg/dL — ABNORMAL LOW (ref 8.9–10.3)
Creatinine, Ser: 0.83 mg/dL (ref 0.44–1.00)
Glucose, Bld: 180 mg/dL — ABNORMAL HIGH (ref 65–99)
POTASSIUM: 4.4 mmol/L (ref 3.5–5.1)
SODIUM: 136 mmol/L (ref 135–145)
Total Bilirubin: 0.4 mg/dL (ref 0.3–1.2)
Total Protein: 5.7 g/dL — ABNORMAL LOW (ref 6.5–8.1)

## 2016-08-26 LAB — HEPARIN LEVEL (UNFRACTIONATED)
Heparin Unfractionated: 0.81 IU/mL — ABNORMAL HIGH (ref 0.30–0.70)
Heparin Unfractionated: 0.56 IU/mL (ref 0.30–0.70)

## 2016-08-26 LAB — LIPID PANEL
CHOLESTEROL: 150 mg/dL (ref 0–200)
HDL: 67 mg/dL (ref >40)
LDL Cholesterol: 69 mg/dL (ref 0–99)
TRIGLYCERIDES: 68 mg/dL (ref <150)
Total CHOL/HDL Ratio: 2.2 RATIO
VLDL: 14 mg/dL (ref 0–40)

## 2016-08-26 LAB — PHOSPHORUS: PHOSPHORUS: 2.8 mg/dL (ref 2.5–4.6)

## 2016-08-26 LAB — MAGNESIUM: MAGNESIUM: 2.4 mg/dL (ref 1.7–2.4)

## 2016-08-26 MED ORDER — POLYETHYLENE GLYCOL 3350 17 G PO PACK
17.0000 g | PACK | Freq: Every day | ORAL | Status: DC
  Administered 2016-08-26 – 2016-09-01 (×7): 17 g via ORAL
  Filled 2016-08-26 (×7): qty 1

## 2016-08-26 MED ORDER — BISOPROLOL FUMARATE 5 MG PO TABS
5.0000 mg | ORAL_TABLET | Freq: Every day | ORAL | Status: DC
  Administered 2016-08-26 – 2016-08-27 (×2): 5 mg via ORAL
  Filled 2016-08-26 (×2): qty 1

## 2016-08-26 MED ORDER — SODIUM CHLORIDE 0.9 % IV SOLN
3.0000 g | Freq: Three times a day (TID) | INTRAVENOUS | Status: AC
  Administered 2016-08-26 – 2016-08-31 (×17): 3 g via INTRAVENOUS
  Filled 2016-08-26 (×17): qty 3

## 2016-08-26 NOTE — Progress Notes (Signed)
Subjective:  She is much more improved with regard to dyspnea denies any chest pain. Still continues to have severe cough but is unproductive.  At present overall feels well. Speaks coherently, having breakfast.  Objective:  Vital Signs in the last 24 hours: Temp:  [97.8 F (36.6 C)-98.8 F (37.1 C)] 97.8 F (36.6 C) (10/18 0623) Pulse Rate:  [76-89] 76 (10/18 0623) Resp:  [16-23] 20 (10/18 0623) BP: (103-129)/(54-91) 121/66 (10/18 0623) SpO2:  [95 %-98 %] 96 % (10/18 0623) Weight:  [50.4 kg (111 lb 3.2 oz)] 50.4 kg (111 lb 3.2 oz) (10/18 0623)   Blood pressure 103/67, pulse 87, temperature 98.2 F (36.8 C), temperature source Oral, resp. rate 16, height 5' (1.524 m), weight 51.9 kg (114 lb 6.4 oz), SpO2 95 %.   Intake/Output from previous day: 10/17 0701 - 10/18 0700 In: 1299.7 [P.O.:240; I.V.:909.7; IV Piggyback:150] Out: -   Physical Exam:  General appearance: alert, cooperative, appears stated age, no distress and frail Eyes: negative findings: lids and lashes normal Neck: no adenopathy, no carotid bruit, no JVD, supple, symmetrical, trachea midline and thyroid not enlarged, symmetric, no tenderness/mass/nodules Neck: JVP - normal, carotids 2+= without bruits Resp: rales bilaterally and extensive Chest wall: no tenderness Cardio: regular rate and rhythm, S1, S2 normal, no murmur, click, rub or gallop and distant heart sounds GI: soft, non-tender; bowel sounds normal; no masses,  no organomegaly Extremities: edema 1 plus pitting. FROM. No tenderness    Lab Results: BMP  Recent Labs  08/24/16 1136 08/25/16 0557 08/26/16 0431  NA 136 135 136  K 2.9* 4.7 4.4  CL 96* 107 106  CO2 24 18* 23  GLUCOSE 169* 175* 180*  BUN 20 14 23*  CREATININE 0.99 0.78 0.83  CALCIUM 9.4 8.4* 8.5*  GFRNONAA 49* >60 >60  GFRAA 56* >60 >60    CBC  Recent Labs Lab 08/24/16 1136  08/26/16 0431  WBC 15.2*  < > 16.9*  RBC 4.77  < > 4.17  HGB 14.0  < > 12.0  HCT 41.3  < > 36.0   PLT 210  < > 206  MCV 86.6  < > 86.3  MCH 29.4  < > 28.8  MCHC 33.9  < > 33.3  RDW 13.3  < > 13.8  LYMPHSABS 1.2  --   --   MONOABS 0.9  --   --   EOSABS 0.0  --   --   BASOSABS 0.0  --   --   < > = values in this interval not displayed.  HEMOGLOBIN A1C Lab Results  Component Value Date   HGBA1C 5.8 (H) 08/24/2016   MPG 120 08/24/2016    Cardiac Panel (last 3 results)  Recent Labs  08/24/16 1136 08/24/16 1644 08/24/16 2129  TROPONINI 0.04* 0.16* 0.65*    Recent Labs  08/24/16 1644  TSH 1.201    Recent Labs  08/24/16 1136 08/26/16 0431  PROT 7.3 5.7*  ALBUMIN 3.4* 2.5*  AST 38 63*  ALT 24 53  ALKPHOS 61 49  BILITOT 0.6 0.4    Imaging: Imaging results have been reviewed  Cardiac Studies:  EKG: 08/24/2016: Atrial flutter with 2:1 conduction at the rate of 130 bpm. Diffuse inferior and lateral ST segment depression with T-wave inversion suggestive of ischemia.  Echo 04/15/2016(office):  Normal LVEF by echocardiogram, mild diastolic dysfunction, no significant valvular abnormality, mild MR and TR.  Telemetry:Patient had already converted to sinus rhythm as of yesterday On 10/60/2017 with intermittent episodes  of atrial flutter.  This morning maintains normal sinus rhythm.  Scheduled Meds: . aspirin EC  81 mg Oral Daily  . atorvastatin  10 mg Oral q1800  . bisoprolol  5 mg Oral Daily  . ceFEPime (MAXIPIME) IV  1 g Intravenous Q24H  . clopidogrel  75 mg Oral QODAY  . guaiFENesin  600 mg Oral BID  . insulin aspart  0-9 Units Subcutaneous TID WC  . ipratropium-albuterol  3 mL Nebulization TID  . sodium chloride HYPERTONIC  4 mL Nebulization Daily   Continuous Infusions: . sodium chloride 20 mL/hr (08/24/16 1331)  . heparin 700 Units/hr (08/26/16 0553)   PRN Meds:.acetaminophen, ALPRAZolam, ondansetron (ZOFRAN) IV   Assessment/Plan:  1. Respiratory distress due to PNA. 2. New A. Flutter with RVR due to respiratory distress. Resolved immediately  within few hours. Status is resolved. CHA2DS2-VASCScore: Risk Score  5,  Yearly risk of stroke  6.7. Recommendation: Anticoagulation   3. CAD native vessel with angina pectoris due to demand ischemia from #1 & #2. Do not suspect ACS primarily. Coronary angiogram  05/17/2013 [facility], [place]) : Left main: 40% distal lesion, 30-40% lesion in ostium. LAD: Heavily calcified and 70% occluded proximally, fills retrograde by the left circumflex. Left circumflex: Luminal irregularities, torturous. RCA: Large, dominant, 50-70% stenosis in the mid segment and in the distal segment. LVEF 60-65% 4. Chronic diastolic heart failure.  5. History of bronchial asthma  Recommendation:from cardiac standpoint she remained stable.  I have discontinued diltiazem, switched her to bisoprolol which will be more cardioselective. I have canceled her echocardiogram. No cardiac studies are planned,I will see her back from the sidelines.  Please call if questions.   Yates Decamp, M.D. 08/26/2016, 9:32 AM [facility] Pager: [phone] Office: [phone] If no answer: [phone]

## 2016-08-26 NOTE — Progress Notes (Signed)
ANTICOAGULATION CONSULT NOTE - Follow Up Consult  Pharmacy Consult for Heparin Indication: atrial fibrillation  Allergies  Allergen Reactions  . Penicillins Nausea And Vomiting   Patient Measurements: Height: 5' (152.4 cm) Weight: 114 lb 6.4 oz (51.9 kg) IBW/kg (Calculated) : 45.5  Vital Signs: Temp: 98.8 F (37.1 C) (10/17 2114) Temp Source: Oral (10/17 2114) BP: 129/91 (10/18 0026) Pulse Rate: 89 (10/17 2114)  Labs:  Recent Labs  08/24/16 1136 08/24/16 1644 08/24/16 2129 08/25/16 0557 08/25/16 0633 08/26/16 0431  HGB 14.0  --   --  11.7*  --  12.0  HCT 41.3  --   --  35.3*  --  36.0  PLT 210  --   --  170  --  206  APTT 34  --   --   --   --   --   LABPROT 14.0  --   --   --   --   --   INR 1.08  --   --   --   --   --   HEPARINUNFRC  --   --  0.25*  --  0.52 0.81*  CREATININE 0.99  --   --  0.78  --  0.83  TROPONINI 0.04* 0.16* 0.65*  --   --   --     Estimated Creatinine Clearance: 32.4 mL/min (by C-G formula based on SCr of 0.83 mg/dL).   Medications:  Heparin @ 800 units/hr  Assessment: 89yof on heparin for afib. Heparin level supratherapeutic (0.81) on gtt at 800 units/hr. No bleeding noted. CBC stable.  Goal of Therapy:  Heparin level 0.3-0.7 units/ml Monitor platelets by anticoagulation protocol: Yes   Plan:  1) Decrease heparin to 700 units/hr 2) F/u 8 hr heparin level  Christoper Fabianaron Kaidence Callaway, PharmD, BCPS Clinical pharmacist, pager [phone] 08/26/2016 5:47 AM

## 2016-08-26 NOTE — Progress Notes (Signed)
ANTICOAGULATION CONSULT NOTE - Follow Up Consult  Pharmacy Consult for Heparin Indication: atrial fibrillation  Allergies  Allergen Reactions  . Penicillins Nausea And Vomiting   Patient Measurements: Height: 5' (152.4 cm) Weight: 111 lb 3.2 oz (50.4 kg) IBW/kg (Calculated) : 45.5  Vital Signs: Temp: 97.8 F (36.6 C) (10/18 1453) Temp Source: Oral (10/18 1453) BP: 121/57 (10/18 1453) Pulse Rate: 94 (10/18 1453)  Labs:  Recent Labs  08/24/16 1136 08/24/16 1644  08/24/16 2129 08/25/16 0557 08/25/16 0633 08/26/16 0431 08/26/16 1349  HGB 14.0  --   --   --  11.7*  --  12.0  --   HCT 41.3  --   --   --  35.3*  --  36.0  --   PLT 210  --   --   --  170  --  206  --   APTT 34  --   --   --   --   --   --   --   LABPROT 14.0  --   --   --   --   --   --   --   INR 1.08  --   --   --   --   --   --   --   HEPARINUNFRC  --   --   < > 0.25*  --  0.52 0.81* 0.56  CREATININE 0.99  --   --   --  0.78  --  0.83  --   TROPONINI 0.04* 0.16*  --  0.65*  --   --   --   --   < > = values in this interval not displayed.  Estimated Creatinine Clearance: 32.4 mL/min (by C-G formula based on SCr of 0.83 mg/dL).  Medications:  Heparin @ 700 units/hr  Assessment: 89yof on heparin for afib. Heparin level at goal (0.5) on gtt at 700 units/hr, cbc stable. No bleeding noted.   Goal of Therapy:  Heparin level 0.3-0.7 units/ml Monitor platelets by anticoagulation protocol: Yes   Plan:  1) Continue heparin at 700 units/hr 2) Daily HL/CBC  Sheppard CoilFrank Wilson PharmD., BCPS Clinical Pharmacist Pager [phone] 08/26/2016 3:56 PM

## 2016-08-26 NOTE — Progress Notes (Signed)
[facility] Progress Note  Patient: [name] ZOX:[id number]   PCP: Darrow Bussing, MD DOB: 14-Aug-1924   DOA: 08/24/2016   DOS: 08/26/2016   Date of Service: the patient was seen and examined on 08/26/2016  Brief hospital course: Pt. with PMH of COPD, HTN, HLD, CAD; admitted on 08/24/2016, with complaint of shortness of breath, was found to have acute hypoxic respiratory failure with community-acquired pneumonia and A. fib with RVR. Currently further plan is continue treatment for community-acquired pneumonia.  Assessment and Plan: 1. Acute respiratory failure with hypoxia (HCC) Patient remains on oxygen at 2 L. Leukocytosis worsening, steroid-induced increase cannot be ruled out. Patient was on vancomycin and cefepime, MRSA PCR is negative THEREFORE discontinue vancomycin. Patient is low risk for Pseudomonas infection and therefore I will change antibiotic to IV Unasyn to improve anaerobic coverage. Likely the patient will be discharged on oral Augmentin. Add Mucinex and flutter valve.  2. COPD-asthma. Patient was given IV steroids, currently steroids are not continued. continue duo nebs.  3. A. flutter with RVR. Currently normal sinus rhythm. He initially was on Cardizem drip, transitioned to oral Cardizem. Currently patient will be switched to oral beta blocker. Per cardiology no indication for long-term ANTIcoagulation  4. Elevated troponin, demand ischemia. Cardiology consulted. Patient was started on IV heparin, currently continued. Cardiology recommends no further inpatient workup.  5. Constipation. Add MiraLAX. Patient may continue Metamucil or MiraLAX at home on discharge.  6. Chronic diastolic dysfunction. Patient is on Lasix 20 mg at home as needed. May need to consider resuming it at some time.  Pain management: When necessary Tylenol Activity: Consulted physical therapy Bowel regimen: last BM 08/24/2016 Diet: Cardiac diet DVT Prophylaxis: on  therapeutic anticoagulation.  Advance goals of care discussion: Full code  Family Communication: family was present at bedside, at the time of interview. The pt provided permission to discuss medical plan with the family. Opportunity was given to ask question and all questions were answered satisfactorily.  Tried to reach patient's daughter on her phone, left voice mail.  Disposition:  Discharge to likely home with home health. Expected discharge date: 08/28/2016, improvement in oxygenation and leukocytosis  Consultants: Cardiology Procedures: None  Antibiotics: Anti-infectives    Start     Dose/Rate Route Frequency Ordered Stop   08/26/16 1200  Ampicillin-Sulbactam (UNASYN) 3 g in sodium chloride 0.9 % 100 mL IVPB     3 g 100 mL/hr over 60 Minutes Intravenous Every 8 hours 08/26/16 1131     08/25/16 1400  vancomycin (VANCOCIN) 500 mg in sodium chloride 0.9 % 100 mL IVPB  Status:  Discontinued     500 mg 100 mL/hr over 60 Minutes Intravenous Every 24 hours 08/24/16 1301 08/26/16 0751   08/25/16 1400  ceFEPIme (MAXIPIME) 1 g in dextrose 5 % 50 mL IVPB  Status:  Discontinued     1 g 100 mL/hr over 30 Minutes Intravenous Every 24 hours 08/24/16 1340 08/26/16 1126   08/24/16 1300  ceFEPIme (MAXIPIME) 1 g in dextrose 5 % 50 mL IVPB     1 g 100 mL/hr over 30 Minutes Intravenous  Once 08/24/16 1245 08/24/16 1404   08/24/16 1300  vancomycin (VANCOCIN) IVPB 1000 mg/200 mL premix     1,000 mg 200 mL/hr over 60 Minutes Intravenous  Once 08/24/16 1257 08/24/16 1439        Subjective: Complains of constipation at home, feeling better but still has generalized weakness, cough is getting better. No chest pain. No nausea no vomiting. Improving  appetite although minimal and she say she is forcing herself to eat.  Objective: Physical Exam: Vitals:   08/26/16 0026 08/26/16 0554 08/26/16 0623 08/26/16 1453  BP: (!) 129/91 114/62 121/66 (!) 121/57  Pulse:   76 94  Resp:   20   Temp:   97.8 F  (36.6 C) 97.8 F (36.6 C)  TempSrc:   Oral Oral  SpO2:   96% 95%  Weight:   50.4 kg (111 lb 3.2 oz)   Height:        Intake/Output Summary (Last 24 hours) at 08/26/16 1733 Last data filed at 08/26/16 0300  Gross per 24 hour  Intake          1059.67 ml  Output                0 ml  Net          1059.67 ml   Filed Weights   08/24/16 1127 08/25/16 0516 08/26/16 0623  Weight: 47.2 kg (104 lb) 51.9 kg (114 lb 6.4 oz) 50.4 kg (111 lb 3.2 oz)    General: Alert, Awake and Oriented to Time, Place and Person. Appear in mild distress, affect appropriate Eyes: PERRL, Conjunctiva normal ENT: Oral Mucosa clear moist. Neck: MILD JVD, NO Abnormal Mass Or lumps Cardiovascular: S1 and S2 Present, AORTIC SYSTOLIC Murmur, Respiratory: Bilateral Air entry equal and Decreased, NO use of accessory muscle, basal Crackles, no wheezes Abdomen: Bowel Sound present, Soft and no tenderness Skin: no redness, no Rash, no induration Extremities: bilateral Pedal edema, no calf tenderness Neurologic: Grossly no focal neuro deficit. Bilaterally Equal motor strength  Data Reviewed: CBC:  Recent Labs Lab 08/24/16 1136 08/25/16 0557 08/26/16 0431  WBC 15.2* 12.5* 16.9*  NEUTROABS 13.0*  --   --   HGB 14.0 11.7* 12.0  HCT 41.3 35.3* 36.0  MCV 86.6 87.6 86.3  PLT 210 170 206   Basic Metabolic Panel:  Recent Labs Lab 08/24/16 1136 08/24/16 1644 08/25/16 0557 08/26/16 0431  NA 136  --  135 136  K 2.9*  --  4.7 4.4  CL 96*  --  107 106  CO2 24  --  18* 23  GLUCOSE 169*  --  175* 180*  BUN 20  --  14 23*  CREATININE 0.99  --  0.78 0.83  CALCIUM 9.4  --  8.4* 8.5*  MG  --  2.1  --  2.4  PHOS  --   --   --  2.8    Liver Function Tests:  Recent Labs Lab 08/24/16 1136 08/26/16 0431  AST 38 63*  ALT 24 53  ALKPHOS 61 49  BILITOT 0.6 0.4  PROT 7.3 5.7*  ALBUMIN 3.4* 2.5*   No results for input(s): LIPASE, AMYLASE in the last 168 hours. No results for input(s): AMMONIA in the last 168  hours. Coagulation Profile:  Recent Labs Lab 08/24/16 1136  INR 1.08   Cardiac Enzymes:  Recent Labs Lab 08/24/16 1136 08/24/16 1644 08/24/16 2129  TROPONINI 0.04* 0.16* 0.65*   BNP (last 3 results) No results for input(s): PROBNP in the last 8760 hours.  CBG:  Recent Labs Lab 08/25/16 1641 08/25/16 2143 08/26/16 0736 08/26/16 1128 08/26/16 1611  GLUCAP 196* 169* 160* 196* 173*    Studies: Dg Chest 2 View  Result Date: 08/26/2016 CLINICAL DATA:  Pneumonia and cough EXAM: CHEST  2 VIEW COMPARISON:  Two days ago FINDINGS: Left lower lobe opacity with small pleural effusion. The opacity has  become more dense than previously but involves a stable area. No edema or pneumothorax. Normal heart size and mediastinal contours. IMPRESSION: Increased density of the left basilar pneumonia. Small left effusion. Electronically Signed   By: Marnee SpringJonathon  Watts M.D.   On: 08/26/2016 09:50     Scheduled Meds: . ampicillin-sulbactam (UNASYN) IV  3 g Intravenous Q8H  . aspirin EC  81 mg Oral Daily  . atorvastatin  10 mg Oral q1800  . bisoprolol  5 mg Oral Daily  . clopidogrel  75 mg Oral QODAY  . guaiFENesin  600 mg Oral BID  . insulin aspart  0-9 Units Subcutaneous TID WC  . ipratropium-albuterol  3 mL Nebulization TID  . polyethylene glycol  17 g Oral Daily  . sodium chloride HYPERTONIC  4 mL Nebulization Daily   Continuous Infusions: . sodium chloride 20 mL/hr (08/24/16 1331)  . heparin 700 Units/hr (08/26/16 0553)   PRN Meds: acetaminophen, ALPRAZolam, ondansetron (ZOFRAN) IV  Time spent: 30 minutes  Author: Lynden OxfordPranav Sherrill Mckamie, MD [facility] Pager: [phone] 08/26/2016 5:33 PM  If 7PM-7AM, please contact night-coverage at [email or web address], password [email or web address]

## 2016-08-27 ENCOUNTER — Inpatient Hospital Stay (HOSPITAL_COMMUNITY): Payer: Medicare Other

## 2016-08-27 DIAGNOSIS — R0902 Hypoxemia: Secondary | ICD-10-CM

## 2016-08-27 DIAGNOSIS — J449 Chronic obstructive pulmonary disease, unspecified: Secondary | ICD-10-CM

## 2016-08-27 DIAGNOSIS — J181 Lobar pneumonia, unspecified organism: Secondary | ICD-10-CM

## 2016-08-27 DIAGNOSIS — R0602 Shortness of breath: Secondary | ICD-10-CM

## 2016-08-27 DIAGNOSIS — I4891 Unspecified atrial fibrillation: Secondary | ICD-10-CM

## 2016-08-27 DIAGNOSIS — R748 Abnormal levels of other serum enzymes: Secondary | ICD-10-CM

## 2016-08-27 DIAGNOSIS — R739 Hyperglycemia, unspecified: Secondary | ICD-10-CM

## 2016-08-27 DIAGNOSIS — R079 Chest pain, unspecified: Secondary | ICD-10-CM

## 2016-08-27 DIAGNOSIS — A419 Sepsis, unspecified organism: Principal | ICD-10-CM

## 2016-08-27 DIAGNOSIS — E876 Hypokalemia: Secondary | ICD-10-CM

## 2016-08-27 LAB — CBC
HCT: 38.2 % (ref 36.0–46.0)
HEMOGLOBIN: 12.7 g/dL (ref 12.0–15.0)
MCH: 28.5 pg (ref 26.0–34.0)
MCHC: 33.2 g/dL (ref 30.0–36.0)
MCV: 85.8 fL (ref 78.0–100.0)
PLATELETS: 238 10*3/uL (ref 150–400)
RBC: 4.45 MIL/uL (ref 3.87–5.11)
RDW: 13.9 % (ref 11.5–15.5)
WBC: 13.7 10*3/uL — AB (ref 4.0–10.5)

## 2016-08-27 LAB — HEPARIN LEVEL (UNFRACTIONATED): HEPARIN UNFRACTIONATED: 0.39 [IU]/mL (ref 0.30–0.70)

## 2016-08-27 LAB — BASIC METABOLIC PANEL
ANION GAP: 6 (ref 5–15)
BUN: 25 mg/dL — ABNORMAL HIGH (ref 6–20)
CALCIUM: 8.5 mg/dL — AB (ref 8.9–10.3)
CO2: 25 mmol/L (ref 22–32)
Chloride: 107 mmol/L (ref 101–111)
Creatinine, Ser: 0.8 mg/dL (ref 0.44–1.00)
GLUCOSE: 128 mg/dL — AB (ref 65–99)
POTASSIUM: 4.3 mmol/L (ref 3.5–5.1)
Sodium: 138 mmol/L (ref 135–145)

## 2016-08-27 LAB — GLUCOSE, CAPILLARY
GLUCOSE-CAPILLARY: 130 mg/dL — AB (ref 65–99)
Glucose-Capillary: 121 mg/dL — ABNORMAL HIGH (ref 65–99)
Glucose-Capillary: 110 mg/dL — ABNORMAL HIGH (ref 65–99)
Glucose-Capillary: 111 mg/dL — ABNORMAL HIGH (ref 65–99)

## 2016-08-27 LAB — HEMOGLOBIN A1C
Hgb A1c MFr Bld: 5.8 % — ABNORMAL HIGH (ref 4.8–5.6)
Mean Plasma Glucose: 120 mg/dL

## 2016-08-27 LAB — BRAIN NATRIURETIC PEPTIDE: B NATRIURETIC PEPTIDE 5: 83.4 pg/mL (ref 0.0–100.0)

## 2016-08-27 MED ORDER — CARVEDILOL 6.25 MG PO TABS
6.2500 mg | ORAL_TABLET | Freq: Two times a day (BID) | ORAL | Status: DC
  Administered 2016-08-27 – 2016-08-28 (×2): 6.25 mg via ORAL
  Filled 2016-08-27 (×2): qty 1

## 2016-08-27 MED ORDER — ENOXAPARIN SODIUM 40 MG/0.4ML ~~LOC~~ SOLN
40.0000 mg | SUBCUTANEOUS | Status: DC
  Administered 2016-08-27 – 2016-08-31 (×5): 40 mg via SUBCUTANEOUS
  Filled 2016-08-27 (×5): qty 0.4

## 2016-08-27 MED ORDER — IRBESARTAN 75 MG PO TABS
75.0000 mg | ORAL_TABLET | Freq: Every day | ORAL | Status: DC
  Administered 2016-08-27 – 2016-09-01 (×6): 75 mg via ORAL
  Filled 2016-08-27 (×6): qty 1

## 2016-08-27 NOTE — Progress Notes (Signed)
Progress Note    [name]  ZOX:[id number] DOB: [birth year]  DOA: 08/24/2016 PCP: Darrow Bussing, MD    Brief Narrative:   Chief complaint: F/U CAP/atrial fibrillation  [name] is an 82 y.o. female with a PMH of COPD/asthma, hypertension, and CAD on chronic Plavix was admitted 08/24/16 for evaluation of chest pain and dyspnea. Upon initial evaluation in the ED, the patient was noted to be in hypoxic respiratory failure with evidence of pneumonia on chest radiography. She was also found to have atrial fibrillation with RVR. In the ED, she was given 125 mg of Solu-Medrol, bronchodilators, IV fluids and labetalol with little improvement. She was looking better yesterday, but her condition deteriorated overnight and she required BiPAP.  Assessment/Plan:   Principal Problem:   Acute respiratory failure with hypoxia (HCC) Secondary to community-acquired pneumonia in the setting of an acute exacerbation of chronic obstructive airway disease with asthma/shortness of breath/hypoxia Chest x-ray is stable. Continue supplemental oxygen and empiric Unasyn. Continue Mucinex and flutter valve. Continue bronchodilators. Given one dose of IV Solu-Medrol in the ED which was not continued. Required BiPAP overnight but was weaned to nasal cannula oxygen this morning. Daughter requests that we call her pulmonologist, so we'll try to arrange this in the morning.  Active Problems:   Hypertension I have resumed the patient's Cardizem, in place of bisoprolol. We'll also resume ARB.    New onset a-fib (HCC) CHA2DS2-VASCScore: Risk Score 5. Has maintained normal sinus rhythm. Initially on a cardizem drip, now on a beta blocker. No indication for long-term anticoagulation per cardiology recommendations. TSH WNL. Continue aspirin/Plavix.    Hyperglycemia Likely steroid-induced, improving. Hemoglobin A1c 5.8%. Continue insulin sensitive SSI.    Chest pain/Elevated troponin ACS not suspected per  cardiologist. Coronary angiogram 05/17/2013 [facility], [place]) : Left main: 40% distal lesion, 30-40% lesion in ostium. LAD: Heavily calcified and 70% occluded proximally, fills retrograde by the left circumflex. Left circumflex: Luminal irregularities, torturous. RCA: Large, dominant, 50-70% stenosis in the mid segment and in the distal segment. LVEF 60-65%. Continue medical management. Discontinue heparin. Continue aspirin/Plavix. Continue statin.    Hypokalemia Resolved.    Sepsis (HCC) Blood cultures negative to date. Lactate cleared with IV fluids and empiric antibiotics.    Family Communication/Anticipated D/C date and plan/Code Status   DVT prophylaxis: Lovenox ordered. Code Status: Full Code.  Family Communication: Daughter updated at the bedside. Disposition Plan: Home versus rehabilitation depending on progress.   Medical Consultants:    Cardiology   Procedures:    None  Anti-Infectives:    Unasyn 08/26/16--->  Vancomycin 08/24/16---> 08/26/16  Cefepime 08/24/16---> 08/26/16  Subjective:   Appears weak. Nursing staff reports that she required BiPAP overnight, but has now been weaned to nasal cannula. She was eating yesterday and this morning according to her daughter. Review of symptoms unable to be obtained from the patient.  Objective:    Vitals:   08/27/16 0127 08/27/16 0626 08/27/16 0751 08/27/16 0757  BP:  (!) 171/86    Pulse: (!) 122 97    Resp: (!) 25 (!) 21    Temp:  97.9 F (36.6 C)    TempSrc:  Axillary    SpO2: 94% 98% 99% 100%  Weight:  54.9 kg (121 lb 1.6 oz)    Height:        Intake/Output Summary (Last 24 hours) at 08/27/16 0824 Last data filed at 08/27/16 0600  Gross per 24 hour  Intake  300 ml  Output              200 ml  Net              100 ml   Filed Weights   08/25/16 0516 08/26/16 0623 08/27/16 0626  Weight: 51.9 kg (114 lb 6.4 oz) 50.4 kg (111 lb 3.2 oz) 54.9 kg (121 lb 1.6 oz)     Exam: General exam: Appears weak, nonverbal. Respiratory system: Clear to auscultation. Respiratory effort normal. Cardiovascular system: S1 & S2 heard, RRR. No JVD,  rubs, gallops or clicks. II/VI SEM. Gastrointestinal system: Abdomen is nondistended, soft and nontender. No organomegaly or masses felt. Normal bowel sounds heard. Central nervous system: Awake, sitting up in bed, neuro exam grossly nonfocal. Extremities: No clubbing,  or cyanosis. 1+ edema. Skin: No rashes, lesions or ulcers. Psychiatry: Judgement and insight appear diminished. Mood & affect flat.   Data Reviewed:   I have personally reviewed following labs and imaging studies:  Labs: Basic Metabolic Panel:  Recent Labs Lab 08/24/16 1136 08/24/16 1644 08/25/16 0557 08/26/16 0431 08/27/16 0434  NA 136  --  135 136 138  K 2.9*  --  4.7 4.4 4.3  CL 96*  --  107 106 107  CO2 24  --  18* 23 25  GLUCOSE 169*  --  175* 180* 128*  BUN 20  --  14 23* 25*  CREATININE 0.99  --  0.78 0.83 0.80  CALCIUM 9.4  --  8.4* 8.5* 8.5*  MG  --  2.1  --  2.4  --   PHOS  --   --   --  2.8  --    GFR Estimated Creatinine Clearance: 36.4 mL/min (by C-G formula based on SCr of 0.8 mg/dL). Liver Function Tests:  Recent Labs Lab 08/24/16 1136 08/26/16 0431  AST 38 63*  ALT 24 53  ALKPHOS 61 49  BILITOT 0.6 0.4  PROT 7.3 5.7*  ALBUMIN 3.4* 2.5*   No results for input(s): LIPASE, AMYLASE in the last 168 hours. No results for input(s): AMMONIA in the last 168 hours. Coagulation profile  Recent Labs Lab 08/24/16 1136  INR 1.08    CBC:  Recent Labs Lab 08/24/16 1136 08/25/16 0557 08/26/16 0431 08/27/16 0434  WBC 15.2* 12.5* 16.9* 13.7*  NEUTROABS 13.0*  --   --   --   HGB 14.0 11.7* 12.0 12.7  HCT 41.3 35.3* 36.0 38.2  MCV 86.6 87.6 86.3 85.8  PLT 210 170 206 238   Cardiac Enzymes:  Recent Labs Lab 08/24/16 1136 08/24/16 1644 08/24/16 2129  TROPONINI 0.04* 0.16* 0.65*   BNP (last 3 results) No  results for input(s): PROBNP in the last 8760 hours. CBG:  Recent Labs Lab 08/26/16 0736 08/26/16 1128 08/26/16 1611 08/26/16 2039 08/27/16 0738  GLUCAP 160* 196* 173* 152* 121*   D-Dimer: No results for input(s): DDIMER in the last 72 hours. Hgb A1c:  Recent Labs  08/24/16 1335 08/26/16 0431  HGBA1C 5.8* 5.8*   Lipid Profile:  Recent Labs  08/25/16 0557 08/26/16 0431  CHOL 134 150  HDL 74 67  LDLCALC 50 69  TRIG 48 68  CHOLHDL 1.8 2.2   Thyroid function studies:  Recent Labs  08/24/16 1644  TSH 1.201   Anemia work up: No results for input(s): VITAMINB12, FOLATE, FERRITIN, TIBC, IRON, RETICCTPCT in the last 72 hours. Sepsis Labs:  Recent Labs Lab 08/24/16 1136 08/24/16 1207 08/24/16 1645 08/24/16 1712 08/25/16 4540  08/26/16 0431 08/27/16 0434  WBC 15.2*  --   --   --  12.5* 16.9* 13.7*  LATICACIDVEN  --  3.77* 1.7 1.71  --   --   --     Microbiology Recent Results (from the past 240 hour(s))  Culture, blood (Routine x 2)     Status: None (Preliminary result)   Collection Time: 08/24/16 11:50 AM  Result Value Ref Range Status   Specimen Description BLOOD RIGHT FOREARM  Final   Special Requests   Final    BOTTLES DRAWN AEROBIC AND ANAEROBIC 4CC AER 5CC ANA   Culture NO GROWTH 2 DAYS  Final   Report Status PENDING  Incomplete  Culture, blood (Routine x 2)     Status: None (Preliminary result)   Collection Time: 08/24/16  2:07 PM  Result Value Ref Range Status   Specimen Description BLOOD LEFT WRIST  Final   Special Requests BOTTLES DRAWN AEROBIC ONLY 5CC  Final   Culture NO GROWTH 2 DAYS  Final   Report Status PENDING  Incomplete  Urine culture     Status: None   Collection Time: 08/24/16  2:15 PM  Result Value Ref Range Status   Specimen Description URINE, RANDOM  Final   Special Requests NONE  Final   Culture NO GROWTH  Final   Report Status 08/25/2016 FINAL  Final  MRSA PCR Screening     Status: None   Collection Time: 08/25/16  8:40  AM  Result Value Ref Range Status   MRSA by PCR NEGATIVE NEGATIVE Final    Comment:        The GeneXpert MRSA Assay (FDA approved for NASAL specimens only), is one component of a comprehensive MRSA colonization surveillance program. It is not intended to diagnose MRSA infection nor to guide or monitor treatment for MRSA infections.     Radiology: Dg Chest 2 View  Result Date: 08/26/2016 CLINICAL DATA:  Pneumonia and cough EXAM: CHEST  2 VIEW COMPARISON:  Two days ago FINDINGS: Left lower lobe opacity with small pleural effusion. The opacity has become more dense than previously but involves a stable area. No edema or pneumothorax. Normal heart size and mediastinal contours. IMPRESSION: Increased density of the left basilar pneumonia. Small left effusion. Electronically Signed   By: Marnee SpringJonathon  Watts M.D.   On: 08/26/2016 09:50    Medications:   . ampicillin-sulbactam (UNASYN) IV  3 g Intravenous Q8H  . aspirin EC  81 mg Oral Daily  . atorvastatin  10 mg Oral q1800  . bisoprolol  5 mg Oral Daily  . clopidogrel  75 mg Oral QODAY  . guaiFENesin  600 mg Oral BID  . insulin aspart  0-9 Units Subcutaneous TID WC  . ipratropium-albuterol  3 mL Nebulization TID  . polyethylene glycol  17 g Oral Daily  . sodium chloride HYPERTONIC  4 mL Nebulization Daily   Continuous Infusions: . sodium chloride 20 mL/hr (08/24/16 1331)  . heparin 700 Units/hr (08/26/16 0553)    Medical decision making is of high complexity and this patient is at high risk of deterioration, therefore this is a level 3 visit.    LOS: 3 days   [name]  [facility] Pager [phone]. If unable to reach me by pager, please call my cell phone at [phone].  *Please refer to [email or web address], password [email or web address] to get updated schedule on who will round on this patient, as hospitalists switch teams weekly. If 7PM-7AM, please contact night-coverage at [email or web address], password [email or web address] for  any overnight needs.  08/27/2016,  8:24 AM

## 2016-08-27 NOTE — Progress Notes (Signed)
ANTICOAGULATION CONSULT NOTE - Follow Up Consult  Pharmacy Consult for Heparin Indication: atrial fibrillation   Assessment: 89yof on heparin for afib. Heparin level at goal (0.39) on gtt at 700 units/hr, cbc stable. No bleeding noted.   MD to discuss long term anticoagulation with patient and family.  CHA2DS2-VASCScore: Risk Score 5, Yearly risk of stroke 6.7  Goal of Therapy:  Heparin level 0.3-0.7 units/ml Monitor platelets by anticoagulation protocol: Yes   Plan:  1) Continue heparin at 700 units/hr 2) Daily HL/CBC   Allergies  Allergen Reactions  . Penicillins Nausea And Vomiting   Patient Measurements: Height: 5' (152.4 cm) Weight: 121 lb 1.6 oz (54.9 kg) IBW/kg (Calculated) : 45.5  Vital Signs: Temp: 97.9 F (36.6 C) (10/19 0626) Temp Source: Axillary (10/19 0626) BP: 171/86 (10/19 0626) Pulse Rate: 97 (10/19 0626)  Labs:  Recent Labs  08/24/16 1644 08/24/16 2129  08/25/16 0557  08/26/16 0431 08/26/16 1349 08/27/16 0434  HGB  --   --   < > 11.7*  --  12.0  --  12.7  HCT  --   --   --  35.3*  --  36.0  --  38.2  PLT  --   --   --  170  --  206  --  238  HEPARINUNFRC  --  0.25*  --   --   < > 0.81* 0.56 0.39  CREATININE  --   --   --  0.78  --  0.83  --  0.80  TROPONINI 0.16* 0.65*  --   --   --   --   --   --   < > = values in this interval not displayed.  Estimated Creatinine Clearance: 36.4 mL/min (by C-G formula based on SCr of 0.8 mg/dL).  Medications:  Heparin @ 700 units/hr  Sheppard CoilFrank Julio Zappia PharmD., BCPS Clinical Pharmacist Pager [phone] 08/27/2016 11:39 AM

## 2016-08-27 NOTE — Progress Notes (Signed)
The client started having respiratory distress while/after having non stop coughing that woke her up from sleep, heart rate ST in the 120's (please see flowsheets) Oxygen stats dropping into the 70's on 2 liters nasal cannula. Nasal cannula turned up to 6 liters getting the client up to the 80's, while awaiting RT to place the client on Bi pap. MD Craige CottaKirby notified. I will continue to monitor the client closely.   Sheppard Evensina Laurabelle Gorczyca RN

## 2016-08-27 NOTE — Progress Notes (Signed)
PT Cancellation Note  Patient Details Name: [name] MRN: [id number] DOB: April 16, 1924   Cancelled Treatment:    Reason Eval/Treat Not Completed: Medical issues which prohibited therapy.  Low sat overnight with very elevated troponin, will wait for cardiology to see her today first.   Ivar DrapeStout, Jailyn Leeson E 08/27/2016, 9:16 AM   Samul Dadauth Jacklyne Baik, PT MS Acute Rehab Dept. Number: [facility] [phone] and [facility] [phone]

## 2016-08-28 LAB — CBC
HEMATOCRIT: 41.2 % (ref 36.0–46.0)
HEMOGLOBIN: 13.7 g/dL (ref 12.0–15.0)
MCH: 28.8 pg (ref 26.0–34.0)
MCHC: 33.3 g/dL (ref 30.0–36.0)
MCV: 86.6 fL (ref 78.0–100.0)
Platelets: 225 10*3/uL (ref 150–400)
RBC: 4.76 MIL/uL (ref 3.87–5.11)
RDW: 13.6 % (ref 11.5–15.5)
WBC: 10.3 10*3/uL (ref 4.0–10.5)

## 2016-08-28 LAB — BASIC METABOLIC PANEL
ANION GAP: 8 (ref 5–15)
BUN: 13 mg/dL (ref 6–20)
CO2: 28 mmol/L (ref 22–32)
Calcium: 8.2 mg/dL — ABNORMAL LOW (ref 8.9–10.3)
Chloride: 101 mmol/L (ref 101–111)
Creatinine, Ser: 0.69 mg/dL (ref 0.44–1.00)
GFR calc Af Amer: >60 mL/min (ref >60)
Glucose, Bld: 95 mg/dL (ref 65–99)
POTASSIUM: 3.8 mmol/L (ref 3.5–5.1)
SODIUM: 137 mmol/L (ref 135–145)

## 2016-08-28 LAB — GLUCOSE, CAPILLARY
GLUCOSE-CAPILLARY: 101 mg/dL — AB (ref 65–99)
GLUCOSE-CAPILLARY: 216 mg/dL — AB (ref 65–99)
GLUCOSE-CAPILLARY: 116 mg/dL — AB (ref 65–99)
Glucose-Capillary: 95 mg/dL (ref 65–99)

## 2016-08-28 MED ORDER — BISOPROLOL FUMARATE 5 MG PO TABS
10.0000 mg | ORAL_TABLET | Freq: Every day | ORAL | Status: DC
  Administered 2016-08-28 – 2016-09-01 (×5): 10 mg via ORAL
  Filled 2016-08-28 (×5): qty 2

## 2016-08-28 NOTE — Evaluation (Signed)
Physical Therapy Evaluation Patient Details Name: [name] MRN: [id number] DOB: 09-Aug-1924 Today's Date: 08/28/2016   History of Present Illness  is a 82 y.o. female who presents with sepsis and acute respiratory failure likely secondary to CAP with associated atrial fibrillation with RVR and mild elevation of troponin likely secondary to reactive strain.   Clinical Impression  Pt with dramatic change in her mobility and cognitive function per patients son's report compared to baseline.  Pt now needing assist with all ADLs including feeding self.  Pt has limited family assist during day at home.  Family is wanting to know options of either rehab facility or hired caregivers for length of day.  Slow progress anticipated for this particular patient as she was limited with her mobility PTA. Therapy will continue to follow to assist with discharge planning and follow up recommendations.    Follow Up Recommendations SNF;Supervision/Assistance - 24 hour    Equipment Recommendations  Rolling walker with 5" wheels;Wheelchair (measurements PT);3in1 (PT)    Recommendations for Other Services       Precautions / Restrictions Precautions Precautions: Fall Restrictions Weight Bearing Restrictions: No      Mobility  Bed Mobility Overal bed mobility: Needs Assistance Bed Mobility: Supine to Sit;Sit to Supine     Supine to sit: Mod assist Sit to supine: Total assist   General bed mobility comments: with raised HOB  Transfers Overall transfer level: Needs assistance Equipment used: Rolling walker (2 wheeled) Transfers: Sit to/from Stand Sit to Stand: Max assist         General transfer comment: tires quickly in standing, reported lightheadedness during transitional mobility  Ambulation/Gait                Stairs            Wheelchair Mobility    Modified Rankin (Stroke Patients Only)       Balance Overall balance assessment: Needs  assistance Sitting-balance support: Bilateral upper extremity supported;Feet supported Sitting balance-Leahy Scale: Poor Sitting balance - Comments: lOB posteriorly during unsupported sitting, min assist for balance Postural control: Posterior lean Standing balance support: Bilateral upper extremity supported Standing balance-Leahy Scale: Poor Standing balance comment: standing tolerance <30 sec                             Pertinent Vitals/Pain Pain Assessment: No/denies pain    Home Living Family/patient expects to be discharged to:: Private residence Living Arrangements: Children Available Help at Discharge: Available PRN/intermittently;Personal care attendant Type of Home: House Home Access: Stairs to enter   Secretary/administratorntrance Stairs-Number of Steps: 2 Home Layout: One level Home Equipment: Cane - single point Additional Comments: aide for short periods of time during day, lives with daughter who works 8 hrs/day    Prior Function Level of Independence: Soil scientisteeds assistance   Gait / Transfers Assistance Needed: supervision with cane  ADL's / Homemaking Assistance Needed: assist with sponge bathing  Comments: alone for hrs at a time during day     Hand Dominance   Dominant Hand: Left    Extremity/Trunk Assessment   Upper Extremity Assessment: Generalized weakness (resting tremor Rt hand)           Lower Extremity Assessment: Generalized weakness      Cervical / Trunk Assessment: Kyphotic  Communication      Cognition Arousal/Alertness: Lethargic Behavior During Therapy: Flat affect Overall Cognitive Status: Impaired/Different from baseline Area of Impairment: Memory  Memory: Decreased short-term memory         General Comments: unable to state her birthdate, son provided history    General Comments General comments (skin integrity, edema, etc.): pt's son assisting pt to eat lunch, pt reports generalized fatigue and weakness, 2 L [place]     Exercises     Assessment/Plan    PT Assessment Patient needs continued PT services  PT Problem List Decreased strength;Decreased activity tolerance;Decreased balance;Decreased mobility;Decreased knowledge of use of DME;Decreased safety awareness;Cardiopulmonary status limiting activity          PT Treatment Interventions DME instruction;Gait training;Stair training;Functional mobility training;Therapeutic activities;Therapeutic exercise;Balance training;Neuromuscular re-education;Cognitive remediation;Patient/family education    PT Goals (Current goals can be found in the Care Plan section)  Acute Rehab PT Goals Patient Stated Goal: none stated PT Goal Formulation: With patient/family Time For Goal Achievement: 09/08/16 Potential to Achieve Goals: Good    Frequency Min 2X/week   Barriers to discharge Decreased caregiver support family requesting hired assist throughout day    Co-evaluation               End of Session Equipment Utilized During Treatment: Gait belt Activity Tolerance: Patient limited by fatigue Patient left: in bed;with call bell/phone within reach;with family/visitor present Nurse Communication: Mobility status         Time: 1202-1245 PT Time Calculation (min) (ACUTE ONLY): 43 min   Charges:   PT Evaluation $PT Eval Moderate Complexity: 1 Procedure PT Treatments $Therapeutic Activity: 8-22 mins   PT G CodesNestor Lewandowsky, PT [phone]  Ilya Ess 08/28/2016, 2:40 PM

## 2016-08-28 NOTE — Progress Notes (Signed)
Pt family member had HCPOA forms, and living will. Pt wishes reviewed with pt and son. Pt code status addressed. Cecille Rubinhompson,Lucresha Dismuke V, RN

## 2016-08-28 NOTE — Progress Notes (Signed)
Progress Note    [name]  ZOX:[id number] DOB: 04-19-1924  DOA: 08/24/2016 PCP: Darrow Bussing, MD    Brief Narrative:   Chief complaint: F/U CAP/atrial fibrillation  [name] is an 82 y.o. female with a PMH of COPD/asthma, hypertension, and CAD on chronic Plavix was admitted 08/24/16 for evaluation of chest pain and dyspnea. Upon initial evaluation in the ED, the patient was noted to be in hypoxic respiratory failure with evidence of pneumonia on chest radiography. She was also found to have atrial fibrillation with RVR. In the ED, she was given 125 mg of Solu-Medrol, bronchodilators, IV fluids and labetalol with little improvement. She was looking better yesterday, but her condition deteriorated overnight and she required BiPAP.  Assessment/Plan:   Principal Problem:   Acute respiratory failure with hypoxia (HCC) Secondary to community-acquired pneumonia in the setting of an acute exacerbation of chronic obstructive airway disease with asthma/shortness of breath/hypoxia Chest x-ray is stable. Continue supplemental oxygen and empiric Unasyn. Continue Mucinex and flutter valve. Continue bronchodilators. Given one dose of IV Solu-Medrol in the ED which was not continued.Pulmonology follow-up requested by family and I spoke with Dr. Craige Cotta about her care and he will see the patient consultation.  Active Problems:   Hypertension Improved today. Continue Avapro. Change Coreg to bisoprolol.    New onset a-fib (HCC) CHA2DS2-VASCScore: Risk Score 5. Has maintained normal sinus rhythm. Initially on a cardizem drip, now on a beta blocker. No indication for long-term anticoagulation per cardiology recommendations. TSH WNL. Continue aspirin/Plavix.    Hyperglycemia Likely steroid-induced, improving. Hemoglobin A1c 5.8%. CBGs 95-130. Continue insulin sensitive SSI.    Chest pain/Elevated troponin ACS not suspected per cardiologist. Coronary angiogram 05/17/2013 [facility], [place]) : Left main: 40% distal lesion, 30-40% lesion in ostium. LAD: Heavily calcified and 70% occluded proximally, fills retrograde by the left circumflex. Left circumflex: Luminal irregularities, torturous. RCA: Large, dominant, 50-70% stenosis in the mid segment and in the distal segment. LVEF 60-65%. Continue medical management. Heparin discontinued 08/27/16. Continue aspirin/Plavix. Continue statin.    Hypokalemia Resolved.    Sepsis (HCC) Blood cultures negative to date. Lactate cleared with IV fluids and empiric antibiotics.   Family Communication/Anticipated D/C date and plan/Code Status   DVT prophylaxis: Lovenox ordered. Code Status: Full Code.  Family Communication: Son updated at the bedside. Disposition Plan: Home with hospice when antibiotics completed.   Medical Consultants:    Cardiology  Pulmonology   Procedures:    None  Anti-Infectives:    Unasyn 08/26/16--->  Vancomycin 08/24/16---> 08/26/16  Cefepime 08/24/16---> 08/26/16  Subjective:   Appears weak. Nursing staff reports that she required BiPAP again overnight, but has now been weaned to nasal cannula. She is more somnolent today. Review of symptoms unable to be obtained from the patient.  Objective:    Vitals:   08/27/16 2133 08/27/16 2350 08/28/16 0640 08/28/16 1355  BP:    (!) 120/59  Pulse:  71  84  Resp:  16  (!) 21  Temp:    99.1 F (37.3 C)  TempSrc:    Axillary  SpO2: 96% 95% 95% 94%  Weight:      Height:        Intake/Output Summary (Last 24 hours) at 08/28/16 1521 Last data filed at 08/28/16 1102  Gross per 24 hour  Intake              460 ml  Output  50 ml  Net              410 ml   Filed Weights   08/25/16 0516 08/26/16 0623 08/27/16 0626  Weight: 51.9 kg (114 lb 6.4 oz) 50.4 kg (111 lb 3.2 oz) 54.9 kg (121 lb 1.6 oz)    Exam: General exam: Somnolent. Respiratory system: Markedly diminished with rales/rhonchi. Respiratory effort  increased. Cardiovascular system: S1 & S2 heard, RRR. No JVD,  rubs, gallops or clicks. II/VI SEM. Gastrointestinal system: Abdomen is nondistended, soft and nontender. No organomegaly or masses felt. Normal bowel sounds heard. Central nervous system: Somnolent. Extremities: No clubbing,  or cyanosis. 1+ edema. Skin: No rashes, lesions or ulcers. Psychiatry: Judgement and insight appear diminished. Mood & affect flat.   Data Reviewed:   I have personally reviewed following labs and imaging studies:  Labs: Basic Metabolic Panel:  Recent Labs Lab 08/24/16 1136 08/24/16 1644 08/25/16 0557 08/26/16 0431 08/27/16 0434 08/28/16 0553  NA 136  --  135 136 138 137  K 2.9*  --  4.7 4.4 4.3 3.8  CL 96*  --  107 106 107 101  CO2 24  --  18* 23 25 28   GLUCOSE 169*  --  175* 180* 128* 95  BUN 20  --  14 23* 25* 13  CREATININE 0.99  --  0.78 0.83 0.80 0.69  CALCIUM 9.4  --  8.4* 8.5* 8.5* 8.2*  MG  --  2.1  --  2.4  --   --   PHOS  --   --   --  2.8  --   --    GFR Estimated Creatinine Clearance: 36.4 mL/min (by C-G formula based on SCr of 0.69 mg/dL). Liver Function Tests:  Recent Labs Lab 08/24/16 1136 08/26/16 0431  AST 38 63*  ALT 24 53  ALKPHOS 61 49  BILITOT 0.6 0.4  PROT 7.3 5.7*  ALBUMIN 3.4* 2.5*   Coagulation profile  Recent Labs Lab 08/24/16 1136  INR 1.08    CBC:  Recent Labs Lab 08/24/16 1136 08/25/16 0557 08/26/16 0431 08/27/16 0434 08/28/16 0553  WBC 15.2* 12.5* 16.9* 13.7* 10.3  NEUTROABS 13.0*  --   --   --   --   HGB 14.0 11.7* 12.0 12.7 13.7  HCT 41.3 35.3* 36.0 38.2 41.2  MCV 86.6 87.6 86.3 85.8 86.6  PLT 210 170 206 238 225   Cardiac Enzymes:  Recent Labs Lab 08/24/16 1136 08/24/16 1644 08/24/16 2129  TROPONINI 0.04* 0.16* 0.65*   BNP (last 3 results) No results for input(s): PROBNP in the last 8760 hours. CBG:  Recent Labs Lab 08/27/16 1116 08/27/16 1650 08/27/16 2129 08/28/16 0805 08/28/16 1152  GLUCAP 110* 111* 130*  95 101*   Hgb A1c:  Recent Labs  08/26/16 0431  HGBA1C 5.8*   Lipid Profile:  Recent Labs  08/26/16 0431  CHOL 150  HDL 67  LDLCALC 69  TRIG 68  CHOLHDL 2.2   Sepsis Labs:  Recent Labs Lab 08/24/16 1207 08/24/16 1645 08/24/16 1712 08/25/16 0557 08/26/16 0431 08/27/16 0434 08/28/16 0553  WBC  --   --   --  12.5* 16.9* 13.7* 10.3  LATICACIDVEN 3.77* 1.7 1.71  --   --   --   --     Microbiology Recent Results (from the past 240 hour(s))  Culture, blood (Routine x 2)     Status: None (Preliminary result)   Collection Time: 08/24/16 11:50 AM  Result Value Ref Range Status  Specimen Description BLOOD RIGHT FOREARM  Final   Special Requests   Final    BOTTLES DRAWN AEROBIC AND ANAEROBIC 4CC AER 5CC ANA   Culture NO GROWTH 4 DAYS  Final   Report Status PENDING  Incomplete  Culture, blood (Routine x 2)     Status: None (Preliminary result)   Collection Time: 08/24/16  2:07 PM  Result Value Ref Range Status   Specimen Description BLOOD LEFT WRIST  Final   Special Requests BOTTLES DRAWN AEROBIC ONLY 5CC  Final   Culture NO GROWTH 4 DAYS  Final   Report Status PENDING  Incomplete  Urine culture     Status: None   Collection Time: 08/24/16  2:15 PM  Result Value Ref Range Status   Specimen Description URINE, RANDOM  Final   Special Requests NONE  Final   Culture NO GROWTH  Final   Report Status 08/25/2016 FINAL  Final  MRSA PCR Screening     Status: None   Collection Time: 08/25/16  8:40 AM  Result Value Ref Range Status   MRSA by PCR NEGATIVE NEGATIVE Final    Comment:        The GeneXpert MRSA Assay (FDA approved for NASAL specimens only), is one component of a comprehensive MRSA colonization surveillance program. It is not intended to diagnose MRSA infection nor to guide or monitor treatment for MRSA infections.     Radiology: Dg Chest Port 1 View  Result Date: 08/27/2016 CLINICAL DATA:  Dyspnea. EXAM: PORTABLE CHEST 1 VIEW COMPARISON:   Radiographs of August 26, 2016. FINDINGS: Stable cardiomediastinal silhouette. Atherosclerosis of thoracic aorta is noted. No pneumothorax is noted. Right lung is clear. Stable left basilar atelectasis or infiltrate is noted with minimal associated pleural effusion. Bony thorax is unremarkable. IMPRESSION: Aortic atherosclerosis. Stable left basilar opacity as described above. Electronically Signed   By: Lupita Raider, M.D.   On: 08/27/2016 08:40    Medications:   . ampicillin-sulbactam (UNASYN) IV  3 g Intravenous Q8H  . aspirin EC  81 mg Oral Daily  . atorvastatin  10 mg Oral q1800  . carvedilol  6.25 mg Oral BID WC  . clopidogrel  75 mg Oral QODAY  . enoxaparin (LOVENOX) injection  40 mg Subcutaneous Q24H  . guaiFENesin  600 mg Oral BID  . insulin aspart  0-9 Units Subcutaneous TID WC  . ipratropium-albuterol  3 mL Nebulization TID  . irbesartan  75 mg Oral Daily  . polyethylene glycol  17 g Oral Daily   Continuous Infusions: . sodium chloride 20 mL/hr (08/24/16 1331)    Medical decision making is of high complexity and this patient is at high risk of deterioration, therefore this is a level 3 visit.    LOS: 4 days   RAMA,CHRISTINA  [facility] Pager [phone]. If unable to reach me by pager, please call my cell phone at [phone].  *Please refer to [email or web address], password [email or web address] to get updated schedule on who will round on this patient, as hospitalists switch teams weekly. If 7PM-7AM, please contact night-coverage at [email or web address], password [email or web address] for any overnight needs.  08/28/2016, 3:21 PM

## 2016-08-28 NOTE — Progress Notes (Addendum)
PULMONARY / CRITICAL CARE MEDICINE   Name: [name] MRN: [id number] DOB: 1924-02-13    ADMISSION DATE:  08/24/2016 CONSULTATION DATE: 08/24/2016  REFERRING MD:  Dr. Konrad Dolores  CHIEF COMPLAINT:  Short of breath  SUBJECTIVE:  Resting comfortably.  No family at bedside.  VITAL SIGNS: BP (!) 120/59 (BP Location: Left Arm)   Pulse 84   Temp 99.1 F (37.3 C) (Axillary)   Resp (!) 21   Ht 5' (1.524 m)   Wt 121 lb 1.6 oz (54.9 kg)   SpO2 94%   BMI 23.65 kg/m   INTAKE / OUTPUT: I/O last 3 completed shifts: In: 622 [P.O.:322; IV Piggyback:300] Out: 200 [Urine:200]  PHYSICAL EXAMINATION: General:  Thin Neuro:  Alert, follows simple commands HEENT: no stridor  Cardiovascular:  regular, no murmur Lungs:  Decreased BS, no wheeze Abdomen:  Soft, non tender, decreased bowel sounds Musculoskeletal:  No edema Skin:  No rashes  LABS: CMP Latest Ref Rng & Units 08/28/2016 08/27/2016 08/26/2016  Glucose 65 - 99 mg/dL 95 409(W) 119(J)  BUN 6 - 20 mg/dL 13 47(W) 29(F)  Creatinine 0.44 - 1.00 mg/dL 6.21 3.08 6.57  Sodium 135 - 145 mmol/L 137 138 136  Potassium 3.5 - 5.1 mmol/L 3.8 4.3 4.4  Chloride 101 - 111 mmol/L 101 107 106  CO2 22 - 32 mmol/L 28 25 23   Calcium 8.9 - 10.3 mg/dL 8.2(L) 8.5(L) 8.5(L)  Total Protein 6.5 - 8.1 g/dL - - 5.7(L)  Total Bilirubin 0.3 - 1.2 mg/dL - - 0.4  Alkaline Phos 38 - 126 U/L - - 49  AST 15 - 41 U/L - - 63(H)  ALT 14 - 54 U/L - - 53    CBC Latest Ref Rng & Units 08/28/2016 08/27/2016 08/26/2016  WBC 4.0 - 10.5 K/uL 10.3 13.7(H) 16.9(H)  Hemoglobin 12.0 - 15.0 g/dL 84.6 96.2 95.2  Hematocrit 36.0 - 46.0 % 41.2 38.2 36.0  Platelets 150 - 400 K/uL 225 238 206    IMAGING: Dg Chest 2 View  Result Date: 08/26/2016 CLINICAL DATA:  Pneumonia and cough EXAM: CHEST  2 VIEW COMPARISON:  Two days ago FINDINGS: Left lower lobe opacity with small pleural effusion. The opacity has become more dense than previously but involves a stable area. No  edema or pneumothorax. Normal heart size and mediastinal contours. IMPRESSION: Increased density of the left basilar pneumonia. Small left effusion. Electronically Signed   By: Marnee Spring M.D.   On: 08/26/2016 09:50   Dg Chest Port 1 View  Result Date: 08/27/2016 CLINICAL DATA:  Dyspnea. EXAM: PORTABLE CHEST 1 VIEW COMPARISON:  Radiographs of August 26, 2016. FINDINGS: Stable cardiomediastinal silhouette. Atherosclerosis of thoracic aorta is noted. No pneumothorax is noted. Right lung is clear. Stable left basilar atelectasis or infiltrate is noted with minimal associated pleural effusion. Bony thorax is unremarkable. IMPRESSION: Aortic atherosclerosis. Stable left basilar opacity as described above. Electronically Signed   By: Lupita Raider, M.D.   On: 08/27/2016 08:40   Dg Chest Portable 1 View  Result Date: 08/24/2016 CLINICAL DATA:  Cough, shortness of breath and wheezing for 4 days. EXAM: PORTABLE CHEST 1 VIEW COMPARISON:  Chest radiograph August 24, 2016 at 1145 hours FINDINGS: Patient now rotated to the LEFT, accentuating the LEFT heart. Cardiomediastinal silhouette is normal, mildly calcified aortic knob. Increased lung volumes and mild chronic interstitial changes. Increasing patchy LEFT lung base airspace opacity. Mild blunting of the costophrenic angles. RIGHT midlung zone granuloma. No pneumothorax. Osteopenia. Soft tissue planes are  nonsuspicious. IMPRESSION: Increasing LEFT lung base airspace opacity concerning for pneumonia versus atelectasis in a background of probable COPD. Recommend follow-up PA and lateral views of the chest when clinically able. Small pleural thickening versus pleural thickening. Electronically Signed   By: Awilda Metroourtnay  Bloomer M.D.   On: 08/24/2016 15:33     CULTURES: Blood 10/16 >> Urine 10/16 >> negative Pneumococcal Ag 10/16 >> negative  ANTIBIOTICS: Vancomycin 10/16 >> 10/17 Cefepime 10/16 >> 10/17 Unasyn 10/18 >>  SIGNIFICANT EVENTS: 10/16  Admit, Cardiology consulted  DISCUSSION: [age] yo female with sepsis and acute hypoxic respiratory failure from PNA and AECOPD complicated by A fib with RVR.  ASSESSMENT / PLAN:  Acute hypoxic respiratory failure. - oxygen to keep SpO2 90 to 95%  AECOPD with PNA. - Day 5 of Abx, currently on unasyn - continue BDs - bronchial hygiene   A fib with RVR. Hx of CAD, HTN, chronic diastolic CHF, HLD. - Cardizem, heparin gtt per primary team  Hx of dementia. - Monitor mental status  Goals of care. - DNR/DNI  Attempted to contact family by phone >> no answer at listed numbers.  PCCM will f/u on Monday 10/23 if she is still in hospital.  Call if help needed sooner.  Otherwise she can f/u with Dr. Kendrick FriesMcQuaid in pulmonary office  Coralyn HellingVineet Briar Sword, MD [facility] 08/28/2016, 3:04 PM Pager:  [phone] After 3pm call: [phone]   I spoke with pt's son over the phone.  Explained that she is receiving appropriate therapies and respiratory status has improved since admission.  She is very weak and will need SNF.  Her son informed me that several of his sisters would prefer she relocate to [place] area.  I explained this might be difficult to arrange straight away, and that [place] might be better option.  Coralyn HellingVineet Georganna Maxson, MD [facility] 08/28/2016, 4:14 PM Pager:  [phone] After 3pm call: [phone]

## 2016-08-28 NOTE — Care Management Important Message (Signed)
Important Message  Patient Details  Name: [name] MRN: [id number] Date of Birth: 14-Jan-1924   Medicare Important Message Given:  Yes    Kyla BalzarineShealy, Arvell Pulsifer [place]ena 08/28/2016, 12:35 PM

## 2016-08-29 LAB — CULTURE, BLOOD (ROUTINE X 2)
CULTURE: NO GROWTH
Culture: NO GROWTH

## 2016-08-29 LAB — GLUCOSE, CAPILLARY
GLUCOSE-CAPILLARY: 100 mg/dL — AB (ref 65–99)
Glucose-Capillary: 96 mg/dL (ref 65–99)
Glucose-Capillary: 121 mg/dL — ABNORMAL HIGH (ref 65–99)
Glucose-Capillary: 89 mg/dL (ref 65–99)

## 2016-08-29 NOTE — Progress Notes (Signed)
Progress Note    [name]  ZOX:[id number] DOB: [birth year]  DOA: 08/24/2016 PCP: Darrow BussingKOIRALA,DIBAS, MD    Brief Narrative:   Chief complaint: F/U CAP/atrial fibrillation  [name] is an 82 y.o. female with a PMH of COPD/asthma, hypertension, and CAD on chronic Plavix was admitted 08/24/16 for evaluation of chest pain and dyspnea. Upon initial evaluation in the ED, the patient was noted to be in hypoxic respiratory failure with evidence of pneumonia on chest radiography. She was also found to have atrial fibrillation with RVR. In the ED, she was given 125 mg of Solu-Medrol, bronchodilators, IV fluids and labetalol with little improvement. She was looking better yesterday, but her condition deteriorated overnight and she required BiPAP.  Assessment/Plan:   Principal Problem:   Acute respiratory failure with hypoxia (HCC) Secondary to community-acquired pneumonia in the setting of an acute exacerbation of chronic obstructive airway disease with asthma/shortness of breath/hypoxia Chest x-ray is stable. Continue supplemental oxygen and empiric Unasyn. Continue Mucinex and flutter valve. Continue bronchodilators. Given one dose of IV Solu-Medrol in the ED which was not continued.Pulmonology follow-up requested by family and I spoke with Dr. Craige CottaSood about her care and he saw the patient 08/28/16, no change in therapy.Seems a bit better today. More alert.  Active Problems:   Hypertension Continue Avapro and bisoprolol.    New onset a-fib (HCC) CHA2DS2-VASCScore: Risk Score 5. Has maintained normal sinus rhythm. Initially on a cardizem drip, now on a beta blocker. No indication for long-term anticoagulation per cardiology recommendations. TSH WNL. Continue aspirin/Plavix.    Hyperglycemia Likely steroid-induced, improving. Hemoglobin A1c 5.8%. CBGs 95-216. Continue insulin sensitive SSI.    Chest pain/Elevated troponin ACS not suspected per cardiologist. Coronary angiogram 05/17/2013  [facility] [place] : Left main: 40% distal lesion, 30-40% lesion in ostium. LAD: Heavily calcified and 70% occluded proximally, fills retrograde by the left circumflex. Left circumflex: Luminal irregularities, torturous. RCA: Large, dominant, 50-70% stenosis in the mid segment and in the distal segment. LVEF 60-65%. Continue medical management. Heparin discontinued 08/27/16. Continue aspirin/Plavix. Continue statin.    Hypokalemia Resolved.    Sepsis (HCC) Blood cultures negative to date. Lactate cleared with IV fluids and empiric antibiotics.   Family Communication/Anticipated D/C date and plan/Code Status   DVT prophylaxis: Lovenox ordered. Code Status: Full Code.  Family Communication: Son updated at the bedside10/20/17. Disposition Plan: SNF, family unable to provide 24/7 care.   Medical Consultants:    Cardiology  Pulmonology   Procedures:    None  Anti-Infectives:    Unasyn 08/26/16--->  Vancomycin 08/24/16---> 08/26/16  Cefepime 08/24/16---> 08/26/16  Subjective:   Appears stronger today, more awake/alert, interactive. ROS positive for shortness of breath, occasional cough and negative for chest pain. Afebrile.  Objective:    Vitals:   08/28/16 1934 08/28/16 1954 08/28/16 2352 08/29/16 0458  BP:  123/83  (!) 150/74  Pulse:  81 80   Resp:  (!) 22 20   Temp:  98.7 F (37.1 C)  98.7 F (37.1 C)  TempSrc:  Axillary    SpO2: 92% 94% 92%   Weight:    48.2 kg (106 lb 4.8 oz)  Height:        Intake/Output Summary (Last 24 hours) at 08/29/16 0758 Last data filed at 08/28/16 2207  Gross per 24 hour  Intake              460 ml  Output  150 ml  Net              310 ml   Filed Weights   08/26/16 0623 08/27/16 0626 08/29/16 0458  Weight: 50.4 kg (111 lb 3.2 oz) 54.9 kg (121 lb 1.6 oz) 48.2 kg (106 lb 4.8 oz)    Exam: General exam: More awake. Respiratory system: Diminished with an occasional rale. Respiratory effort mildly  increased. Cardiovascular system: S1 & S2 heard, RRR. No JVD,  rubs, gallops or clicks. II/VI SEM. Gastrointestinal system: Abdomen is nondistended, soft and nontender. No organomegaly or masses felt. Normal bowel sounds heard. Central nervous system: Awake, disoriented. Extremities: No clubbing,  or cyanosis. 1+ edema. Skin: No rashes, lesions or ulcers. Psychiatry: Judgement and insight appear diminished. Mood & affect flat.   Data Reviewed:   I have personally reviewed following labs and imaging studies:  Labs: Basic Metabolic Panel:  Recent Labs Lab 08/24/16 1136 08/24/16 1644 08/25/16 0557 08/26/16 0431 08/27/16 0434 08/28/16 0553  NA 136  --  135 136 138 137  K 2.9*  --  4.7 4.4 4.3 3.8  CL 96*  --  107 106 107 101  CO2 24  --  18* 23 25 28   GLUCOSE 169*  --  175* 180* 128* 95  BUN 20  --  14 23* 25* 13  CREATININE 0.99  --  0.78 0.83 0.80 0.69  CALCIUM 9.4  --  8.4* 8.5* 8.5* 8.2*  MG  --  2.1  --  2.4  --   --   PHOS  --   --   --  2.8  --   --    GFR Estimated Creatinine Clearance: 33.6 mL/min (by C-G formula based on SCr of 0.69 mg/dL). Liver Function Tests:  Recent Labs Lab 08/24/16 1136 08/26/16 0431  AST 38 63*  ALT 24 53  ALKPHOS 61 49  BILITOT 0.6 0.4  PROT 7.3 5.7*  ALBUMIN 3.4* 2.5*   Coagulation profile  Recent Labs Lab 08/24/16 1136  INR 1.08    CBC:  Recent Labs Lab 08/24/16 1136 08/25/16 0557 08/26/16 0431 08/27/16 0434 08/28/16 0553  WBC 15.2* 12.5* 16.9* 13.7* 10.3  NEUTROABS 13.0*  --   --   --   --   HGB 14.0 11.7* 12.0 12.7 13.7  HCT 41.3 35.3* 36.0 38.2 41.2  MCV 86.6 87.6 86.3 85.8 86.6  PLT 210 170 206 238 225   Cardiac Enzymes:  Recent Labs Lab 08/24/16 1136 08/24/16 1644 08/24/16 2129  TROPONINI 0.04* 0.16* 0.65*   CBG:  Recent Labs Lab 08/28/16 0805 08/28/16 1152 08/28/16 1609 08/28/16 1949 08/29/16 0721  GLUCAP 95 101* 216* 116* 100*   Sepsis Labs:  Recent Labs Lab 08/24/16 1207  08/24/16 1645 08/24/16 1712 08/25/16 0557 08/26/16 0431 08/27/16 0434 08/28/16 0553  WBC  --   --   --  12.5* 16.9* 13.7* 10.3  LATICACIDVEN 3.77* 1.7 1.71  --   --   --   --     Microbiology Recent Results (from the past 240 hour(s))  Culture, blood (Routine x 2)     Status: None (Preliminary result)   Collection Time: 08/24/16 11:50 AM  Result Value Ref Range Status   Specimen Description BLOOD RIGHT FOREARM  Final   Special Requests   Final    BOTTLES DRAWN AEROBIC AND ANAEROBIC 4CC AER 5CC ANA   Culture NO GROWTH 4 DAYS  Final   Report Status PENDING  Incomplete  Culture, blood (Routine  x 2)     Status: None (Preliminary result)   Collection Time: 08/24/16  2:07 PM  Result Value Ref Range Status   Specimen Description BLOOD LEFT WRIST  Final   Special Requests BOTTLES DRAWN AEROBIC ONLY 5CC  Final   Culture NO GROWTH 4 DAYS  Final   Report Status PENDING  Incomplete  Urine culture     Status: None   Collection Time: 08/24/16  2:15 PM  Result Value Ref Range Status   Specimen Description URINE, RANDOM  Final   Special Requests NONE  Final   Culture NO GROWTH  Final   Report Status 08/25/2016 FINAL  Final  MRSA PCR Screening     Status: None   Collection Time: 08/25/16  8:40 AM  Result Value Ref Range Status   MRSA by PCR NEGATIVE NEGATIVE Final    Comment:        The GeneXpert MRSA Assay (FDA approved for NASAL specimens only), is one component of a comprehensive MRSA colonization surveillance program. It is not intended to diagnose MRSA infection nor to guide or monitor treatment for MRSA infections.     Radiology: Dg Chest Port 1 View  Result Date: 08/27/2016 CLINICAL DATA:  Dyspnea. EXAM: PORTABLE CHEST 1 VIEW COMPARISON:  Radiographs of August 26, 2016. FINDINGS: Stable cardiomediastinal silhouette. Atherosclerosis of thoracic aorta is noted. No pneumothorax is noted. Right lung is clear. Stable left basilar atelectasis or infiltrate is noted with  minimal associated pleural effusion. Bony thorax is unremarkable. IMPRESSION: Aortic atherosclerosis. Stable left basilar opacity as described above. Electronically Signed   By: Lupita Raider, M.D.   On: 08/27/2016 08:40    Medications:   . ampicillin-sulbactam (UNASYN) IV  3 g Intravenous Q8H  . aspirin EC  81 mg Oral Daily  . atorvastatin  10 mg Oral q1800  . bisoprolol  10 mg Oral Daily  . clopidogrel  75 mg Oral QODAY  . enoxaparin (LOVENOX) injection  40 mg Subcutaneous Q24H  . guaiFENesin  600 mg Oral BID  . insulin aspart  0-9 Units Subcutaneous TID WC  . ipratropium-albuterol  3 mL Nebulization TID  . irbesartan  75 mg Oral Daily  . polyethylene glycol  17 g Oral Daily   Continuous Infusions: . sodium chloride 20 mL/hr (08/24/16 1331)    Medical decision making is of moderate complexity and this patient is at moderate risk of deterioration, therefore this is a level 2 visit.    LOS: 5 days   RAMA,CHRISTINA  [facility] Pager [phone]. If unable to reach me by pager, please call my cell phone at [phone].  *Please refer to [email or web address], password [email or web address] to get updated schedule on who will round on this patient, as hospitalists switch teams weekly. If 7PM-7AM, please contact night-coverage at [email or web address], password [email or web address] for any overnight needs.  08/29/2016, 7:58 AM

## 2016-08-29 NOTE — Care Management Note (Addendum)
Case Management Note  Patient Details  Name: [name] MRN: [id number] Date of Birth: 02-23-1924  Subjective/Objective:                   Action/Plan: Discharge planning Expected Discharge Date:                  Expected Discharge Plan:  Home w Hospice Care  In-House Referral:  Clinical Social Work  Discharge planning Services  CM Consult  Post Acute Care Choice:    Choice offered to:  Patient, Adult Children  DME Arranged:    DME Agency:     HH Arranged:    [place] Agency:     Status of Service:  In process, will continue to follow  If discussed at Long Length of Stay Meetings, dates discussed:    Additional Comments: CM received call from [place] as no CM has spoken with pt.  Cm notes unit RN, Etta Quill, RN, has acknowledged the CM consult which takes it off our worklist to pursue.  CM met with pt and pt's son, Dr. Thomasenia [name] [phone] to explain the option of Home with Hospice. CM explained the home hospice agency provides support through a hospice RN but the everyday care of the pt is the responsibility of the family.  CM offered a [employer] [name] professional List and explained the cost is approximately $30/hour and is NOT covered by insurance.  Dr. Oliva Bustard states family is unable to provide 24/7 caretaking and would like to discuss the option of hospice at [facility].  CM placed a CSW consult and called CSW [phone] and left message.  Will continue to follow.  Dellie Catholic, RN 08/29/2016, 1:22 PM

## 2016-08-29 NOTE — Progress Notes (Signed)
After a long discussion with family, family feels there has been a misunderstanding they would like her to go to rehab and get stronger,and do not want hospice involved. Daughter would also like her to stay hospitalized until completely well states, "at least another week", they are inquiring about a second opinion out of state and a SNF in the [place] area. Dr. Darnelle Catalanama has been paged and updated and will meet with family tomorrow, If son who is a Dr. Is not present he would like to be called. I have had Case management and social work to speak with the family to offer resources and answer questions. Will continue to monitor.

## 2016-08-29 NOTE — Clinical Social Work Note (Signed)
Clinical Social Work Assessment  Patient Details  Name: [name] MRN: [id number] Date of Birth: [birth year]  Date of referral:  08/29/16               Reason for consult:  Facility Placement, [employer], Other (Comment Required) (? Hospice referral, after hospital care needs)                Permission sought to share information with:  Family Supports Permission granted to share information::  Yes, Verbal Permission Granted  Name::     Son, Daughters, Grandson      Housing/Transportation Living arrangements for the past 2 months:  Single Family Home (With one of her daughters) Source of Information:  Patient, Adult Children Patient Interpreter Needed:  None Criminal Activity/Legal Involvement Pertinent to Current Situation/Hospitalization:  No - Comment as needed Significant Relationships:  Adult Children Lives with:  Adult Children (Daughter) Do you feel safe going back to the place where you live?  No (Needs 24 hour care/support which daughter cannot provide) Need for family participation in patient care:  Yes (Comment) (Very invovled and supportive family)  Care giving concerns:   Lives with daughter and son-in-law who are unable to provide 24 hour care at home due to work responsibilities.  Pt wants to move to [place] to live with her other daughter.  MD and PT are recommending SNF placement.  Pt currently has personal care services 3 1/2 hours a day at home which are not adequate to provide for her care needs. Pt requires 02 and utilization of a BIPAP machine at this time.  Social Worker assessment / plan: CSW met with patient, son, daughter and grandson for very extensive discussion about care options and many questions were answered. Patient has been living with one of her daughters who current works. Patient has what appears to be [place] 3 1/2 hours a day at home but family is requesting 24 hour care. Patient, who is alert and able to verbalize  her wishes indicates that she wants to go live with hter other daughter who lives up near [place].  Daughter wants this as well but again- wants her to have 24 hour care and for it to be covered by and Medicaid.  This SW attempted to help this family understand the limitations of both Medicare and Medicaid as well as that neither would cover 24 hour care at home.  Discussed SNF placement and if patient needed SNF with hospice- then would not meet Medicare criteria in the hospital.  (She would not require IV pain meds or any skilled service).  Family does not feel she can tolerate very much "rehab therapy";  Discussed long term care placement with Medicaid and that her Social Security check would need to be given to the nursing center each month to supplement Medicaid coverage. They were not comfortable with this option.  Daughter is uncomfortable with considering Hospice care at this time and wanted option of getting patient transferred from [facility] to a hospital in [place] where she could get continued care and be referred for [facility] for home care program.  SW offered to assist with NH placement in this area or to look at placement in [place], [place]  and discussed costs of transportation if [place] placement was indicated  Family was not comfortable with this either.  Family declines option of paying for private care to supplement current PCS program that is in place until patient could evaluated  for the CAP program nor do they feel that any family can stay in this area to help current daughter with her care.  After well over an hour of discussing all possible option scenerios- family appeared to be at an impasse.  Daughter insisted that this SW speak to her friend who has an agency up near [place] to request that patient receive 24 hour care "through Medicare/Medicaid".  She was unable to reach her friend prior to end of this work day- however, SW again reiterated to daughter  that 24 hour care though Medicare and Medicaid was not possible. This SW felt it would be best end today's discussion and allow the family and patient to talk about the options discussed and will ask covering SW tomorrow to follow up with family. Discussed that placement process was lengthy and encouraged them to make a decision by tomorrow so that processes could be initiated.  Should they elect for home with home health in [place] then CM will need to get involved.  Family and patient were pleased with information provided and were appreciative of SW's visit.   Employment status:  Disabled (Comment on whether or not currently receiving Disability) Insurance information:  Medicare PT Recommendations:  [place] / Referral to community resources:   (? SNF vs Home Health vs Hospice)  Patient/Family's Response to care:  They are satisfied with her current provision of her care but are very unsure off her aftercare needs.  ([place], SNF, Hospice, personal care services)  Patient/Family's Understanding of and Emotional Response to Diagnosis, Current Treatment, and Prognosis: Family is unsure about current care needs and appear to be conflicted as to her prognosis and care needs.  Daughter wanted to consider possibility of a "second opinion" or even transfer of patient to hospital closer to her home vs remaining in this hospital for an extended period of time to determine if she will recover.   Emotional Assessment Appearance:  Appears stated age Attitude/Demeanor/Rapport:   (Appropriate for age and situation) Affect (typically observed):  Calm, Quiet, Appropriate Orientation:  Oriented to Self, Oriented to Place, Oriented to  Time, Oriented to Situation Alcohol / Substance use:  Never Used Psych involvement (Current and /or in the community):  No (Comment)  Discharge Needs  Concerns to be addressed:  Care Coordination, Discharge Planning Concerns Readmission within the last  30 days:  No Current discharge risk:  Dependent with Mobility, Other (Does not have 24 hour care; Hospice has been discussed with family) Barriers to Discharge:  Continued Medical Work up (Family is indecisive re: d/c plan)   Williemae Area, LCSW 08/29/2016, 5:30 PM

## 2016-08-29 NOTE — Clinical Social Work Note (Signed)
Excerpt from SW assessment:  CSW met with patient, son, daughter and grandson for very extensive discussion about care options and many questions were answered. Patient has been living with one of her daughters who current works. Patient has what appears to be [place] 3 1/2 hours a day at home but family is requesting 24 hour care. Patient, who is alert and able to verbalize her wishes indicates that she wants to go live with hter other daughter who lives up near [place].  Daughter wants this as well but again- wants her to have 24 hour care and for it to be covered by and Medicaid.  This SW attempted to help this family understand the limitations of both Medicare and Medicaid as well as that neither would cover 24 hour care at home.  Discussed SNF placement and if patient needed SNF with hospice- then would not meet Medicare criteria in the hospital.  (She would not require IV pain meds or any skilled service).  Family does not feel she can tolerate very much "rehab therapy";  Discussed long term care placement with Medicaid and that her Social Security check would need to be given to the nursing center each month to supplement Medicaid coverage. They were not comfortable with this option.  Daughter is uncomfortable with considering Hospice care at this time and wanted option of getting patient transferred from [facility] to a hospital in [place] where she could get continued care and be referred for [facility] for home care program.  SW offered to assist with NH placement in this area or to look at placement in [place], [place]  and discussed costs of transportation if [place] placement was indicated  Family was not comfortable with this either.  Family declines option of paying for private care to supplement current PCS program that is in place until patient could evaluated for the CAP program nor do they feel that any family can stay in this area to help current daughter  with her care.  After well over an hour of discussing all possible option scenerios- family appeared to be at an impasse.  Daughter insisted that this SW speak to her friend who has an agency up near [place] to request that patient receive 24 hour care "through Medicare/Medicaid".  She was unable to reach her friend prior to end of this work day- however, SW again reiterated to daughter that 24 hour care though Medicare and Medicaid was not possible. This SW felt it would be best end today's discussion and allow the family and patient to talk about the options discussed and will ask covering SW tomorrow to follow up with family. Discussed that placement process was lengthy and encouraged them to make a decision by tomorrow so that processes could be initiated.  Should they elect for home with home health in [place] then CM will need to get involved.  Family and patient were pleased with information provided and were appreciative of SW's visit.  Lorie Phenix. [place], [place]

## 2016-08-30 LAB — GLUCOSE, CAPILLARY
GLUCOSE-CAPILLARY: 90 mg/dL (ref 65–99)
GLUCOSE-CAPILLARY: 119 mg/dL — AB (ref 65–99)
Glucose-Capillary: 158 mg/dL — ABNORMAL HIGH (ref 65–99)
Glucose-Capillary: 114 mg/dL — ABNORMAL HIGH (ref 65–99)

## 2016-08-30 NOTE — Progress Notes (Signed)
Progress Note    Ameira Alessandrini  ZOX:[id number] DOB: 1924/09/09  DOA: 08/24/2016 PCP: Darrow Bussing, MD    Brief Narrative:   Chief complaint: F/U CAP/atrial fibrillation  Miroslava Santellan is an 82 y.o. female with a PMH of COPD/asthma, hypertension, and CAD on chronic Plavix was admitted 08/24/16 for evaluation of chest pain and dyspnea. Upon initial evaluation in the ED, the patient was noted to be in hypoxic respiratory failure with evidence of pneumonia on chest radiography. She was also found to have atrial fibrillation with RVR. In the ED, she was given 125 mg of Solu-Medrol, bronchodilators, IV fluids and labetalol with little improvement. She was looking better yesterday, but her condition deteriorated overnight and she required BiPAP.  Assessment/Plan:   Principal Problem:   Acute respiratory failure with hypoxia (HCC) Secondary to community-acquired pneumonia in the setting of an acute exacerbation of chronic obstructive airway disease with asthma/shortness of breath/hypoxia Chest x-ray is stable. Continue supplemental oxygen and empiric Unasyn. Blood cultures negative. Continue Mucinex and flutter valve. Continue bronchodilators. Given one dose of IV Solu-Medrol in the ED which was not continued.Pulmonology follow-up requested by family and I spoke with Dr. Craige Cotta about her care and he saw the patient 08/28/16, no change in therapy.  Active Problems:   Hypertension Continue Avapro and bisoprolol.    New onset a-fib (HCC) CHA2DS2-VASCScore: Risk Score 5. Has maintained normal sinus rhythm. Initially on a cardizem drip, now on a beta blocker. No indication for long-term anticoagulation per cardiology recommendations. TSH WNL. Continue aspirin/Plavix.    Hyperglycemia Likely steroid-induced, improving. Hemoglobin A1c 5.8%. CBGs 89-121. Continue insulin sensitive SSI.    Chest pain/Elevated troponin ACS not suspected per cardiologist. Coronary angiogram 05/17/2013 [facility] [place]) : Left main: 40% distal lesion, 30-40% lesion in ostium. LAD: Heavily calcified and 70% occluded proximally, fills retrograde by the left circumflex. Left circumflex: Luminal irregularities, torturous. RCA: Large, dominant, 50-70% stenosis in the mid segment and in the distal segment. LVEF 60-65%. Continue medical management. Heparin discontinued 08/27/16. Continue aspirin/Plavix. Continue statin.    Hypokalemia Resolved.    Sepsis (HCC) Blood cultures negative to date. Lactate cleared with IV fluids and empiric antibiotics.   Family Communication/Anticipated D/C date and plan/Code Status   DVT prophylaxis: Lovenox ordered. Code Status: Full Code.  Family Communication: Son, daughter and grandson updated at the bedside. Disposition Plan: SNF, family unable to provide 24/7 care.   Medical Consultants:    Cardiology  Pulmonology   Procedures:    None  Anti-Infectives:    Unasyn 08/26/16--->  Vancomycin 08/24/16---> 08/26/16  Cefepime 08/24/16---> 08/26/16  Subjective:   Appears more lethargic today, family says that she was awake and ate breakfast this morning.  Unable to obtain ROS.   Objective:    Vitals:   08/29/16 1943 08/29/16 2052 08/30/16 0624 08/30/16 0742  BP:  (!) 180/77 (!) 189/89 (!) 177/78  Pulse:  79 82 80  Resp:  18 (!) 21 (!) 22  Temp:  98.7 F (37.1 C) 98.2 F (36.8 C)   TempSrc:  Oral Oral   SpO2: 95% 95% 92% 91%  Weight:   42.8 kg (94 lb 6.4 oz)   Height:        Intake/Output Summary (Last 24 hours) at 08/30/16 0810 Last data filed at 08/29/16 1700  Gross per 24 hour  Intake              340 ml  Output  0 ml  Net              340 ml   Filed Weights   08/27/16 0626 08/29/16 0458 08/30/16 0624  Weight: 54.9 kg (121 lb 1.6 oz) 48.2 kg (106 lb 4.8 oz) 42.8 kg (94 lb 6.4 oz)    Exam: General exam: Lethargic. Respiratory system: Diminished. Respiratory effort mildly increased. Cardiovascular  system: S1 & S2 heard, RRR. No JVD,  rubs, gallops or clicks. II/VI SEM. Gastrointestinal system: Abdomen is nondistended, soft and nontender. No organomegaly or masses felt. Normal bowel sounds heard. Central nervous system: Lethargic, disoriented. Extremities: No clubbing,  or cyanosis. 1+ edema. Skin: No rashes, lesions or ulcers. Psychiatry: Judgement and insight appear diminished. Mood & affect flat.   Data Reviewed:   I have personally reviewed following labs and imaging studies:  Labs: Basic Metabolic Panel:  Recent Labs Lab 08/24/16 1136 08/24/16 1644 08/25/16 0557 08/26/16 0431 08/27/16 0434 08/28/16 0553  NA 136  --  135 136 138 137  K 2.9*  --  4.7 4.4 4.3 3.8  CL 96*  --  107 106 107 101  CO2 24  --  18* 23 25 28   GLUCOSE 169*  --  175* 180* 128* 95  BUN 20  --  14 23* 25* 13  CREATININE 0.99  --  0.78 0.83 0.80 0.69  CALCIUM 9.4  --  8.4* 8.5* 8.5* 8.2*  MG  --  2.1  --  2.4  --   --   PHOS  --   --   --  2.8  --   --    GFR Estimated Creatinine Clearance: 31.6 mL/min (by C-G formula based on SCr of 0.69 mg/dL). Liver Function Tests:  Recent Labs Lab 08/24/16 1136 08/26/16 0431  AST 38 63*  ALT 24 53  ALKPHOS 61 49  BILITOT 0.6 0.4  PROT 7.3 5.7*  ALBUMIN 3.4* 2.5*   Coagulation profile  Recent Labs Lab 08/24/16 1136  INR 1.08    CBC:  Recent Labs Lab 08/24/16 1136 08/25/16 0557 08/26/16 0431 08/27/16 0434 08/28/16 0553  WBC 15.2* 12.5* 16.9* 13.7* 10.3  NEUTROABS 13.0*  --   --   --   --   HGB 14.0 11.7* 12.0 12.7 13.7  HCT 41.3 35.3* 36.0 38.2 41.2  MCV 86.6 87.6 86.3 85.8 86.6  PLT 210 170 206 238 225   Cardiac Enzymes:  Recent Labs Lab 08/24/16 1136 08/24/16 1644 08/24/16 2129  TROPONINI 0.04* 0.16* 0.65*   CBG:  Recent Labs Lab 08/29/16 0721 08/29/16 1145 08/29/16 1703 08/29/16 2050 08/30/16 0735  GLUCAP 100* 96 121* 89 90   Sepsis Labs:  Recent Labs Lab 08/24/16 1207 08/24/16 1645 08/24/16 1712  08/25/16 0557 08/26/16 0431 08/27/16 0434 08/28/16 0553  WBC  --   --   --  12.5* 16.9* 13.7* 10.3  LATICACIDVEN 3.77* 1.7 1.71  --   --   --   --     Microbiology Recent Results (from the past 240 hour(s))  Culture, blood (Routine x 2)     Status: None   Collection Time: 08/24/16 11:50 AM  Result Value Ref Range Status   Specimen Description BLOOD RIGHT FOREARM  Final   Special Requests   Final    BOTTLES DRAWN AEROBIC AND ANAEROBIC 4CC AER 5CC ANA   Culture NO GROWTH 5 DAYS  Final   Report Status 08/29/2016 FINAL  Final  Culture, blood (Routine x 2)  Status: None   Collection Time: 08/24/16  2:07 PM  Result Value Ref Range Status   Specimen Description BLOOD LEFT WRIST  Final   Special Requests BOTTLES DRAWN AEROBIC ONLY 5CC  Final   Culture NO GROWTH 5 DAYS  Final   Report Status 08/29/2016 FINAL  Final  Urine culture     Status: None   Collection Time: 08/24/16  2:15 PM  Result Value Ref Range Status   Specimen Description URINE, RANDOM  Final   Special Requests NONE  Final   Culture NO GROWTH  Final   Report Status 08/25/2016 FINAL  Final  MRSA PCR Screening     Status: None   Collection Time: 08/25/16  8:40 AM  Result Value Ref Range Status   MRSA by PCR NEGATIVE NEGATIVE Final    Comment:        The GeneXpert MRSA Assay (FDA approved for NASAL specimens only), is one component of a comprehensive MRSA colonization surveillance program. It is not intended to diagnose MRSA infection nor to guide or monitor treatment for MRSA infections.     Radiology: No results found.  Medications:   . ampicillin-sulbactam (UNASYN) IV  3 g Intravenous Q8H  . aspirin EC  81 mg Oral Daily  . atorvastatin  10 mg Oral q1800  . bisoprolol  10 mg Oral Daily  . clopidogrel  75 mg Oral QODAY  . enoxaparin (LOVENOX) injection  40 mg Subcutaneous Q24H  . guaiFENesin  600 mg Oral BID  . insulin aspart  0-9 Units Subcutaneous TID WC  . ipratropium-albuterol  3 mL  Nebulization TID  . irbesartan  75 mg Oral Daily  . polyethylene glycol  17 g Oral Daily   Continuous Infusions: . sodium chloride 20 mL/hr (08/24/16 1331)    I spent > 35 minutes with this patient and her family.  I held a lengthy discussion with them about her prognosis, plan of care, and post-discharge care options.  > 50% of this time was spent at the patient's bedside in the room with the patient and her family.   LOS: 6 days   Hoorain Kozakiewicz  [facility] Pager [phone]. If unable to reach me by pager, please call my cell phone at [phone].  *Please refer to [email or web address], password [email or web address] to get updated schedule on who will round on this patient, as hospitalists switch teams weekly. If 7PM-7AM, please contact night-coverage at [email or web address], password [email or web address] for any overnight needs.  08/30/2016, 8:10 AM

## 2016-08-30 NOTE — Progress Notes (Signed)
CM received call from CSW concerning O2 needs for [place] to travel [place] to [place].  Cm called [facility] rep, Jermaine who has requested a HH O2 order for a portable O2 concentrator.  CM requested order from MD and pulmonary saturation note from RN. IF the family decides to drive pt to [place], O2 needs can be handled by the portable concentrator.  CM has updated CSW and both CSW and CM will continue to follow for pt progress and disposition needs.

## 2016-08-30 NOTE — [place] FL2 (Signed)
[place] MEDICAID FL2 LEVEL OF CARE SCREENING TOOL     IDENTIFICATION  Patient Name: Angelica ColaDevyani Kernes Birthdate: 12-08-1923 Sex: female Admission Date (Current Location): 08/24/2016  [facility] [place]   Facility and Address:  [facility], [address], [place], [place] [address]      Provider Number: [id number]  Attending Physician Name and Address:  Maryruth Bunhristina P Rama, MD  Relative Name and Phone Number:       Current Level of Care: Hospital Recommended Level of Care: Skilled Nursing Facility Prior Approval Number:    Date Approved/Denied:   PASRR Number:    Discharge Plan: SNF    Current Diagnoses: Patient Active Problem List   Diagnosis Date Noted  . Hypoxia   . CAP (community acquired pneumonia) 08/24/2016  . New onset a-fib (HCC) 08/24/2016  . Hyperglycemia 08/24/2016  . Chest pain 08/24/2016  . Elevated troponin 08/24/2016  . Acute respiratory failure with hypoxia (HCC) 08/24/2016  . Hypokalemia 08/24/2016  . Sepsis (HCC) 08/24/2016  . Atrial fibrillation with rapid ventricular response (HCC)   . Shortness of breath 04/10/2016  . Chronic obstructive airway disease with asthma (HCC) 04/10/2016    Orientation RESPIRATION BLADDER Height & Weight     Self, Place  O2 (3L) Incontinent Weight: 94 lb 6.4 oz (42.8 kg) Height:  5' (152.4 cm)  BEHAVIORAL SYMPTOMS/MOOD NEUROLOGICAL BOWEL NUTRITION STATUS      Incontinent Diet (Heart Healthy/Carb Modified, Thin Liquids)  AMBULATORY STATUS COMMUNICATION OF NEEDS Skin   Extensive Assist   Normal                       Personal Care Assistance Level of Assistance  Bathing, Dressing, Feeding Bathing Assistance: Maximum assistance Feeding assistance: Limited assistance Dressing Assistance: Maximum assistance     Functional Limitations Info  Sight, Hearing, Speech Sight Info: Adequate Hearing Info: Impaired Speech Info: Adequate    SPECIAL CARE FACTORS  FREQUENCY  PT (By licensed PT)     PT Frequency: 5              Contractures Contractures Info: Not present    Additional Factors Info  Code Status, Allergies, Insulin Sliding Scale Code Status Info: DNR Allergies Info: Penicillins   Insulin Sliding Scale Info: 3x/day       Current Medications (08/30/2016):  This is the current hospital active medication list Current Facility-Administered Medications  Medication Dose Route Frequency Provider Last Rate Last Dose  . 0.9 %  sodium chloride infusion  10-20 mL/hr Intravenous Continuous Canary Brimhristopher J Tegeler, MD 20 mL/hr at 08/24/16 1331 20 mL/hr at 08/24/16 1331  . acetaminophen (TYLENOL) tablet 650 mg  650 mg Oral Q4H PRN Gwenyth BenderKaren M Black, NP      . ALPRAZolam Prudy Feeler(XANAX) tablet 0.25 mg  0.25 mg Oral BID PRN Gwenyth BenderKaren M Black, NP   0.25 mg at 08/29/16 2327  . Ampicillin-Sulbactam (UNASYN) 3 g in sodium chloride 0.9 % 100 mL IVPB  3 g Intravenous Q8H Earnie LarssonFrank R Wilson, RPH   3 g at 08/30/16 1251  . aspirin EC tablet 81 mg  81 mg Oral Daily Lesle ChrisKaren M Black, NP   81 mg at 08/30/16 1028  . atorvastatin (LIPITOR) tablet 10 mg  10 mg Oral q1800 [facility] Lat[facility] Sheikh, DO   10 mg at 08/29/16 1756  . bisoprolol (ZEBETA) tablet 10 mg  10 mg Oral Daily Maryruth Bunhristina P Rama, MD   10 mg at 08/30/16 1028  .  clopidogrel (PLAVIX) tablet 75 mg  75 mg Oral QODAY Gwenyth Bender, NP   75 mg at 08/30/16 1028  . enoxaparin (LOVENOX) injection 40 mg  40 mg Subcutaneous Q24H Maryruth Bun Rama, MD   40 mg at 08/29/16 2056  . guaiFENesin (MUCINEX) 12 hr tablet 600 mg  600 mg Oral BID Heloise Beecham Sheikh, DO   600 mg at 08/30/16 1028  . insulin aspart (novoLOG) injection 0-9 Units  0-9 Units Subcutaneous TID WC Gwenyth Bender, NP   1 Units at 08/29/16 1756  . ipratropium-albuterol (DUONEB) 0.5-2.5 (3) MG/3ML nebulizer solution 3 mL  3 mL Nebulization TID Heloise Beecham Sheikh, DO   3 mL at 08/30/16 0741  . irbesartan (AVAPRO) tablet 75 mg  75 mg Oral Daily Maryruth Bun Rama, MD   75 mg at  08/30/16 1028  . ondansetron (ZOFRAN) injection 4 mg  4 mg Intravenous Q6H PRN Lesle Chris Black, NP      . polyethylene glycol (MIRALAX / GLYCOLAX) packet 17 g  17 g Oral Daily Rolly Salter, MD   17 g at 08/30/16 1028     Discharge Medications: Please see discharge summary for a list of discharge medications.  Relevant Imaging Results:  Relevant Lab Results:   Additional Information SSN:  [id number]  Palliative Care NP to follow at SNF.  Dede Query, LCSW

## 2016-08-30 NOTE — Progress Notes (Signed)
SATURATION QUALIFICATIONS: (This note is used to comply with regulatory documentation for home oxygen)  Patient Saturations on Room Air at Rest = 87%  Patient Saturations on Room Air while Ambulating = UTA    Patient unable to ambulate but requires oxygen just resting in bed to keep from desating

## 2016-08-30 NOTE — Clinical Social Work Note (Signed)
CSW spoke with the family to address disposition. Pt's family shared that they would like to have pt go to [facility] [place], [place]. CSW shared that pt's family would be responsible for transportation to facility, either paying privately for medical transportation or providing it via private vehicle. Pt has O2 requirements and would need portable O2. CSW updated RNCM regarding this matter, who will look into a portable concentrator, as bringing multiple tanks across state lines is not an option in a private vehicle. CSW will also explore the option of ambulance and possible flying [place]. CSW will update family. CSW will continue to follow.   Dede QuerySarah Kenlea Woodell, MSW, LCSW Clinical Social Worker  [phone]

## 2016-08-31 DIAGNOSIS — J9601 Acute respiratory failure with hypoxia: Secondary | ICD-10-CM

## 2016-08-31 LAB — CBC
HCT: 40.9 % (ref 36.0–46.0)
HEMOGLOBIN: 13.6 g/dL (ref 12.0–15.0)
MCH: 28.5 pg (ref 26.0–34.0)
MCHC: 33.3 g/dL (ref 30.0–36.0)
MCV: 85.6 fL (ref 78.0–100.0)
Platelets: 216 10*3/uL (ref 150–400)
RBC: 4.78 MIL/uL (ref 3.87–5.11)
RDW: 13.3 % (ref 11.5–15.5)
WBC: 10.7 10*3/uL — ABNORMAL HIGH (ref 4.0–10.5)

## 2016-08-31 LAB — GLUCOSE, CAPILLARY
GLUCOSE-CAPILLARY: 98 mg/dL (ref 65–99)
GLUCOSE-CAPILLARY: 95 mg/dL (ref 65–99)
GLUCOSE-CAPILLARY: 145 mg/dL — AB (ref 65–99)
Glucose-Capillary: 149 mg/dL — ABNORMAL HIGH (ref 65–99)

## 2016-08-31 LAB — BASIC METABOLIC PANEL
ANION GAP: 8 (ref 5–15)
BUN: 14 mg/dL (ref 6–20)
CALCIUM: 8.8 mg/dL — AB (ref 8.9–10.3)
CO2: 29 mmol/L (ref 22–32)
Chloride: 103 mmol/L (ref 101–111)
Creatinine, Ser: 0.73 mg/dL (ref 0.44–1.00)
GFR calc non Af Amer: >60 mL/min (ref >60)
Glucose, Bld: 94 mg/dL (ref 65–99)
Potassium: 3.1 mmol/L — ABNORMAL LOW (ref 3.5–5.1)
Sodium: 140 mmol/L (ref 135–145)

## 2016-08-31 MED ORDER — POTASSIUM CHLORIDE 10 MEQ/100ML IV SOLN
10.0000 meq | INTRAVENOUS | Status: AC
  Administered 2016-08-31 (×5): 10 meq via INTRAVENOUS
  Filled 2016-08-31 (×5): qty 100

## 2016-08-31 NOTE — Progress Notes (Signed)
PULMONARY / CRITICAL CARE MEDICINE   Name: [name] MRN: [id number] DOB: [birth year]    ADMISSION DATE:  08/24/2016 CONSULTATION DATE: 08/24/2016  REFERRING MD:  Dr. Konrad DoloresMerrell  CHIEF COMPLAINT:  Short of breath  SUBJECTIVE:   Resting comfortably. Son at bedside.   VITAL SIGNS: BP 112/88 (BP Location: Left Arm)   Pulse 80   Temp 97.8 F (36.6 C) (Oral)   Resp 20   Ht 5' (1.524 m)   Wt 45.3 kg (99 lb 14.4 oz)   SpO2 94%   BMI 19.51 kg/m   INTAKE / OUTPUT: I/O last 3 completed shifts: In: 740 [P.O.:540; IV Piggyback:200] Out: 100 [Urine:100]  PHYSICAL EXAMINATION:  General:  Frail elderly female Neuro:  Alert, follows simple commands. Some confusion/delirium IE asking to be repositioned every few seconds. HEENT: [place]/AT, PERRL Cardiovascular:  regular, no murmur Lungs:  Decreased BS, no wheeze Abdomen:  Soft, non tender, decreased bowel sounds Musculoskeletal:  No edema Skin:  No rashes  LABS: CMP Latest Ref Rng & Units 08/31/2016 08/28/2016 08/27/2016  Glucose 65 - 99 mg/dL 94 95 865(H128(H)  BUN 6 - 20 mg/dL 14 13 84(O25(H)  Creatinine 0.44 - 1.00 mg/dL 9.620.73 9.520.69 8.410.80  Sodium 135 - 145 mmol/L 140 137 138  Potassium 3.5 - 5.1 mmol/L 3.1(L) 3.8 4.3  Chloride 101 - 111 mmol/L 103 101 107  CO2 22 - 32 mmol/L 29 28 25   Calcium 8.9 - 10.3 mg/dL 3.2(G8.8(L) 4.0(N8.2(L) 0.2(V8.5(L)  Total Protein 6.5 - 8.1 g/dL - - -  Total Bilirubin 0.3 - 1.2 mg/dL - - -  Alkaline Phos 38 - 126 U/L - - -  AST 15 - 41 U/L - - -  ALT 14 - 54 U/L - - -    CBC Latest Ref Rng & Units 08/31/2016 08/28/2016 08/27/2016  WBC 4.0 - 10.5 K/uL 10.7(H) 10.3 13.7(H)  Hemoglobin 12.0 - 15.0 g/dL 25.313.6 66.413.7 40.312.7  Hematocrit 36.0 - 46.0 % 40.9 41.2 38.2  Platelets 150 - 400 K/uL 216 225 238    IMAGING: No results found.   CULTURES: Blood 10/16 >> neg Urine 10/16 >> negative Pneumococcal Ag 10/16 >> negative  ANTIBIOTICS: Vancomycin 10/16 >> 10/17 Cefepime 10/16 >> 10/17 Unasyn 10/18  >>10/23  SIGNIFICANT EVENTS: 10/16 Admit, Cardiology consulted  DISCUSSION: 82 yo female with sepsis and acute hypoxic respiratory failure from PNA and AECOPD complicated by A fib with RVR. Improving slowly with ABX. Still requiring supplemental O2.  ASSESSMENT / PLAN:  Acute hypoxic respiratory failure. - oxygen to keep SpO2 90 to 95%. Was 100% on 4L > turned down to 3L  AECOPD with PNA. - ABX last day today - continue BDs - bronchial hygiene - Will likely need home O2 at discharge based on requirements at rest - to SNF tomorrow 10/24   A fib with RVR. Hx of CAD, HTN, chronic diastolic CHF, HLD. - Cardizem, heparin gtt per primary team  Hx of dementia. - Monitor mental status  Goals of care. - DNR/DNI   Son wanting [place]. Although this is not ideal, and I do not recommend this. I feel as though she could make trip safely assuming she has adequate oxygen supply.   Joneen RoachPaul Hoffman, AGACNP-BC [facility] Pager [phone] or [phone]  08/31/2016 11:42 AM   STAFF NOTE: Cindi CarbonI, Makaio Mach, MD FACP have personally reviewed patient's available data, including medical history, events of note, physical examination and test results  as part of my evaluation. I have discussed with resident/NP and other care providers such as pharmacist, RN and RRT. In addition, I personally evaluated patient and elicited key findings of: sleeping, no distress, not impressed for infiltrate on prior pcxr, aggressive measures would be futile, dnr noted , consider dc home and comfort care if fails in future, for unasyn to stop date, continued BDers as noted, will sign off To snf  Mcarthur Rossetti. Tyson Alias, MD, FACP Pgr: [phone] [facility] 08/31/2016 6:06 PM

## 2016-08-31 NOTE — Care Management Note (Addendum)
Case Management Note  Patient Details  Name: [name] MRN: [id number] Date of Birth: Dec 25, 1923  Subjective/Objective:  Pt presented for Acute Hypoxic Respiratory Failure- Pt on Bipap over the weekend. Continues on IV Unasyn. Plan for d/c to [facility] [place]                   Action/Plan: CSW assisting with disposition needs. Plan for transport at this time via Ambulance transport. [facility] will not be provided 02 since pt will be going to SNF. No needs at this time from CM.   Expected Discharge Date:                  Expected Discharge Plan:  Skilled Nursing Facility  In-House Referral:  Clinical Social Work  Discharge planning Services  CM Consult  Post Acute Care Choice:  NA Choice offered to:  NA  DME Arranged:  N/A DME Agency:  NA  HH Arranged:  NA HH Agency:  NA  Status of Service:  Completed, signed off  If discussed at Long Length of Stay Meetings, dates discussed:    Additional Comments: 1032 09-01-16 Tomi BambergerBrenda Graves-Bigelow, RN,BSN [phone]  CM did speak with son [name] [phone] and he will provide pt transport via car to SNF. Per MD pt will not need 02 for travel.  Daughter at bedside [name]- [phone]- awaiting additional family for transport. Per pt it will be @ least a 5 -6 hour drive. CM did ask CSW to make SNF aware that pt will be there late this evening. No further needs from CM at this time.    Gala LewandowskyGraves-Bigelow, Harvest Stanco Kaye, RN 08/31/2016, 4:00 PM

## 2016-08-31 NOTE — Evaluation (Signed)
Clinical/Bedside Swallow Evaluation Patient Details  Name: [name] MRN: [id number] Date of Birth: 1924/06/24  Today's Date: 08/31/2016 Time: SLP Start Time (ACUTE ONLY): 1552 SLP Stop Time (ACUTE ONLY): 1616 SLP Time Calculation (min) (ACUTE ONLY): 24 min  Past Medical History:  Past Medical History:  Diagnosis Date  . Asthma   . Coronary artery disease   . High cholesterol   . Hypertension    Past Surgical History: History reviewed. No pertinent surgical history. HPI:  82 y.o.femalewith a PMH of COPD/asthma, hypertension, and CAD on chronic Plavix was admitted 08/24/16 for evaluation of chest pain and dyspnea. Upon initial evaluation in the ED, the patient was noted to be in hypoxic respiratory failure with evidence of pneumonia on chest radiography. She was also found to have atrial fibrillation with RVR.  Most recent CXR is stable.   Assessment / Plan / Recommendation Clinical Impression  Pt positioned upright in bed and needed moderate cueing for arousal before initiating PO trials. Pt consumed thin liquid and had a single episode of immediate coughing; however given additional trials of thin liquid this did not occur again. She had audible, multiple swallows for liquid trials, but her vocal quality remained clear throughout. Given regular solid trials, pt presented with prolonged mastication and mild oral residue that cleared with spontaneous tongue sweep and additional sips of thin liquid. Son present reports her having a poor appetite and intermittent instances of her coughing with mixed consistency foods. SLP completed education involving swallowing with a decreased respiratory status, importance of alertness while eating, and risks for aspiration related pneumonia. No further speech therapy is necessary. Recommend continuing with regular diet and thin liquids, using aspiration precautions and avoiding mixed consistencies.    Aspiration Risk  Mild aspiration risk    Diet  Recommendation Regular;Thin liquid   Liquid Administration via: Cup;Straw Medication Administration: Whole meds with liquid Supervision: Staff to assist with self feeding Postural Changes: Seated upright at 90 degrees;Remain upright for at least 30 minutes after po intake    Other  Recommendations Oral Care Recommendations: Oral care BID   Follow up Recommendations Skilled Nursing facility      Frequency and Duration            Prognosis Prognosis for Safe Diet Advancement: Good      Swallow Study   General HPI: 82 y.o.femalewith a PMH of COPD/asthma, hypertension, and CAD on chronic Plavix was admitted 08/24/16 for evaluation of chest pain and dyspnea. Upon initial evaluation in the ED, the patient was noted to be in hypoxic respiratory failure with evidence of pneumonia on chest radiography. She was also found to have atrial fibrillation with RVR.  Most recent CXR is stable. Type of Study: Bedside Swallow Evaluation Previous Swallow Assessment: none in chart Diet Prior to this Study: Regular;Thin liquids Temperature Spikes Noted: No Respiratory Status: Nasal cannula History of Recent Intubation: No Behavior/Cognition: Cooperative;Pleasant mood;Lethargic/Drowsy Oral Cavity Assessment: Within Functional Limits Oral Care Completed by SLP: No Oral Cavity - Dentition: Dentures, top;Dentures, bottom Vision: Functional for self-feeding Self-Feeding Abilities: Able to feed self;Needs assist;Needs set up Patient Positioning: Upright in bed Baseline Vocal Quality: Low vocal intensity Volitional Cough: Weak Volitional Swallow: Able to elicit    Oral/Motor/Sensory Function Overall Oral Motor/Sensory Function: Within functional limits   Ice Chips Ice chips: Not tested   Thin Liquid Thin Liquid: Impaired Presentation: Straw;Cup Pharyngeal  Phase Impairments: Multiple swallows;Cough - Immediate    Nectar Thick Nectar Thick Liquid: Not tested   Honey Thick  Honey Thick Liquid: Not  tested   Puree Puree: Within functional limits   Solid   GO   Solid: Impaired Oral Phase Functional Implications: Oral residue;Impaired mastication       Tollie EthHaleigh Ragan Jimeka Balan, Student SLP  Caryl NeverHaleigh R Lamonte Hartt 08/31/2016,4:53 PM

## 2016-08-31 NOTE — Progress Notes (Signed)
Progress Note    [name]  ZOX:[id number] DOB: 10-04-1924  DOA: 08/24/2016 PCP: Darrow Bussing, MD    Brief Narrative:   Chief complaint: F/U CAP/atrial fibrillation  [name] is an 82 y.o. female with a PMH of COPD/asthma, hypertension, and CAD on chronic Plavix was admitted 08/24/16 for evaluation of chest pain and dyspnea. Upon initial evaluation in the ED, the patient was noted to be in hypoxic respiratory failure with evidence of pneumonia on chest radiography. She was also found to have atrial fibrillation with RVR. In the ED, she was given 125 mg of Solu-Medrol, bronchodilators, IV fluids and labetalol with little improvement. She was looking better yesterday, but her condition deteriorated overnight and she required BiPAP.  Assessment/Plan:   Principal Problem:   Acute respiratory failure with hypoxia (HCC) Secondary to community-acquired pneumonia in the setting of an acute exacerbation of chronic obstructive airway disease with asthma/shortness of breath/hypoxia Chest x-ray is stable. Continue supplemental oxygen and empiric Unasyn. Blood cultures negative. Continue Mucinex and flutter valve. Continue bronchodilators. Given one dose of IV Solu-Medrol in the ED which was not continued.Pulmonology follow-up requested by family and I spoke with Dr. Craige Cotta about her care and he saw the patient 08/28/16, no change in therapy. Speech therapy/swallowing evaluation requested due to concerns for aspiration.  Active Problems:   Hypertension Continue Avapro and bisoprolol.    New onset a-fib (HCC) CHA2DS2-VASCScore: Risk Score 5. Has maintained normal sinus rhythm. Initially on a cardizem drip, now on a beta blocker. No indication for long-term anticoagulation per cardiology recommendations. TSH WNL. Continue aspirin/Plavix.    Hyperglycemia Likely steroid-induced, improving. Hemoglobin A1c 5.8%. CBGs 89-121. Continue insulin sensitive SSI.    Chest pain/Elevated  troponin ACS not suspected per cardiologist. Coronary angiogram 05/17/2013 [facility], [place]) : Left main: 40% distal lesion, 30-40% lesion in ostium. LAD: Heavily calcified and 70% occluded proximally, fills retrograde by the left circumflex. Left circumflex: Luminal irregularities, torturous. RCA: Large, dominant, 50-70% stenosis in the mid segment and in the distal segment. LVEF 60-65%. Continue medical management. Heparin discontinued 08/27/16. Continue aspirin/Plavix. Continue statin.    Hypokalemia Potassium low today. Replete.    Sepsis (HCC) Blood cultures negative to date. Lactate cleared with IV fluids and empiric antibiotics.   Family Communication/Anticipated D/C date and plan/Code Status   DVT prophylaxis: Lovenox ordered. Code Status: Full Code.  Family Communication: Son updated at the bedside. Disposition Plan: SNF, family unable to provide 24/7 care.   Medical Consultants:    Cardiology  Pulmonology   Procedures:    None  Anti-Infectives:    Unasyn 08/26/16--->  Vancomycin 08/24/16---> 08/26/16  Cefepime 08/24/16---> 08/26/16  Subjective:   Appears about the same, lethargic but perks up when stimulated. Didn't eat breakfast today.  Denied dyspnea. Denies pain.  Objective:    Vitals:   08/30/16 1310 08/30/16 1454 08/30/16 2111 08/31/16 0500  BP: (!) 122/55  (!) 185/80 112/88  Pulse: 78 73 82 80  Resp: 20 (!) 25 20 20   Temp: 98 F (36.7 C)  98.1 F (36.7 C) 97.8 F (36.6 C)  TempSrc: Oral  Oral Oral  SpO2: 93% 94% 94% 92%  Weight:    45.3 kg (99 lb 14.4 oz)  Height:        Intake/Output Summary (Last 24 hours) at 08/31/16 0730 Last data filed at 08/31/16 0431  Gross per 24 hour  Intake              740 ml  Output              100 ml  Net              640 ml   Filed Weights   08/29/16 0458 08/30/16 0624 08/31/16 0500  Weight: 48.2 kg (106 lb 4.8 oz) 42.8 kg (94 lb 6.4 oz) 45.3 kg (99 lb 14.4 oz)    Exam: General  exam: Lethargic. Respiratory system: Diminished. Respiratory effort mildly increased. Cardiovascular system: S1 & S2 heard, RRR. No JVD,  rubs, gallops or clicks. II/VI SEM. Gastrointestinal system: Abdomen is nondistended, soft and nontender. No organomegaly or masses felt. Normal bowel sounds heard. Central nervous system: Lethargic, disoriented. Extremities: No clubbing,  or cyanosis. 1+ edema. Skin: No rashes, lesions or ulcers. Psychiatry: Judgement and insight appear diminished. Mood & affect flat.   Data Reviewed:   I have personally reviewed following labs and imaging studies:  Labs: Basic Metabolic Panel:  Recent Labs Lab 08/24/16 1644 08/25/16 0557 08/26/16 0431 08/27/16 0434 08/28/16 0553 08/31/16 0403  NA  --  135 136 138 137 140  K  --  4.7 4.4 4.3 3.8 3.1*  CL  --  107 106 107 101 103  CO2  --  18* 23 25 28 29   GLUCOSE  --  175* 180* 128* 95 94  BUN  --  14 23* 25* 13 14  CREATININE  --  0.78 0.83 0.80 0.69 0.73  CALCIUM  --  8.4* 8.5* 8.5* 8.2* 8.8*  MG 2.1  --  2.4  --   --   --   PHOS  --   --  2.8  --   --   --    GFR Estimated Creatinine Clearance: 33.4 mL/min (by C-G formula based on SCr of 0.73 mg/dL). Liver Function Tests:  Recent Labs Lab 08/24/16 1136 08/26/16 0431  AST 38 63*  ALT 24 53  ALKPHOS 61 49  BILITOT 0.6 0.4  PROT 7.3 5.7*  ALBUMIN 3.4* 2.5*   Coagulation profile  Recent Labs Lab 08/24/16 1136  INR 1.08    CBC:  Recent Labs Lab 08/24/16 1136 08/25/16 0557 08/26/16 0431 08/27/16 0434 08/28/16 0553 08/31/16 0403  WBC 15.2* 12.5* 16.9* 13.7* 10.3 10.7*  NEUTROABS 13.0*  --   --   --   --   --   HGB 14.0 11.7* 12.0 12.7 13.7 13.6  HCT 41.3 35.3* 36.0 38.2 41.2 40.9  MCV 86.6 87.6 86.3 85.8 86.6 85.6  PLT 210 170 206 238 225 216   Cardiac Enzymes:  Recent Labs Lab 08/24/16 1136 08/24/16 1644 08/24/16 2129  TROPONINI 0.04* 0.16* 0.65*   CBG:  Recent Labs Lab 08/29/16 2050 08/30/16 0735 08/30/16 1123  08/30/16 1616 08/30/16 2108  GLUCAP 89 90 119* 158* 114*   Sepsis Labs:  Recent Labs Lab 08/24/16 1207 08/24/16 1645 08/24/16 1712  08/26/16 0431 08/27/16 0434 08/28/16 0553 08/31/16 0403  WBC  --   --   --   < > 16.9* 13.7* 10.3 10.7*  LATICACIDVEN 3.77* 1.7 1.71  --   --   --   --   --   < > = values in this interval not displayed.  Microbiology Recent Results (from the past 240 hour(s))  Culture, blood (Routine x 2)     Status: None   Collection Time: 08/24/16 11:50 AM  Result Value Ref Range Status   Specimen Description BLOOD RIGHT FOREARM  Final   Special Requests   Final  BOTTLES DRAWN AEROBIC AND ANAEROBIC 4CC AER 5CC ANA   Culture NO GROWTH 5 DAYS  Final   Report Status 08/29/2016 FINAL  Final  Culture, blood (Routine x 2)     Status: None   Collection Time: 08/24/16  2:07 PM  Result Value Ref Range Status   Specimen Description BLOOD LEFT WRIST  Final   Special Requests BOTTLES DRAWN AEROBIC ONLY 5CC  Final   Culture NO GROWTH 5 DAYS  Final   Report Status 08/29/2016 FINAL  Final  Urine culture     Status: None   Collection Time: 08/24/16  2:15 PM  Result Value Ref Range Status   Specimen Description URINE, RANDOM  Final   Special Requests NONE  Final   Culture NO GROWTH  Final   Report Status 08/25/2016 FINAL  Final  MRSA PCR Screening     Status: None   Collection Time: 08/25/16  8:40 AM  Result Value Ref Range Status   MRSA by PCR NEGATIVE NEGATIVE Final    Comment:        The GeneXpert MRSA Assay (FDA approved for NASAL specimens only), is one component of a comprehensive MRSA colonization surveillance program. It is not intended to diagnose MRSA infection nor to guide or monitor treatment for MRSA infections.     Radiology: No results found.  Medications:   . ampicillin-sulbactam (UNASYN) IV  3 g Intravenous Q8H  . aspirin EC  81 mg Oral Daily  . atorvastatin  10 mg Oral q1800  . bisoprolol  10 mg Oral Daily  . clopidogrel  75 mg  Oral QODAY  . enoxaparin (LOVENOX) injection  40 mg Subcutaneous Q24H  . guaiFENesin  600 mg Oral BID  . insulin aspart  0-9 Units Subcutaneous TID WC  . ipratropium-albuterol  3 mL Nebulization TID  . irbesartan  75 mg Oral Daily  . polyethylene glycol  17 g Oral Daily   Continuous Infusions: . sodium chloride 20 mL/hr (08/24/16 1331)    Moderate complexity visit, level II.   LOS: 7 days   Eileen Kangas  [facility] Pager [phone]. If unable to reach me by pager, please call my cell phone at [phone].  *Please refer to [email or web address], password [email or web address] to get updated schedule on who will round on this patient, as hospitalists switch teams weekly. If 7PM-7AM, please contact night-coverage at [email or web address], password [email or web address] for any overnight needs.  08/31/2016, 7:30 AM

## 2016-09-01 DIAGNOSIS — J45909 Unspecified asthma, uncomplicated: Secondary | ICD-10-CM | POA: Diagnosis not present

## 2016-09-01 DIAGNOSIS — F419 Anxiety disorder, unspecified: Secondary | ICD-10-CM | POA: Diagnosis not present

## 2016-09-01 DIAGNOSIS — J159 Unspecified bacterial pneumonia: Secondary | ICD-10-CM | POA: Diagnosis not present

## 2016-09-01 DIAGNOSIS — R531 Weakness: Secondary | ICD-10-CM | POA: Diagnosis not present

## 2016-09-01 DIAGNOSIS — R0602 Shortness of breath: Secondary | ICD-10-CM | POA: Diagnosis not present

## 2016-09-01 DIAGNOSIS — J441 Chronic obstructive pulmonary disease with (acute) exacerbation: Secondary | ICD-10-CM | POA: Diagnosis not present

## 2016-09-01 DIAGNOSIS — R251 Tremor, unspecified: Secondary | ICD-10-CM | POA: Diagnosis not present

## 2016-09-01 DIAGNOSIS — A419 Sepsis, unspecified organism: Secondary | ICD-10-CM | POA: Diagnosis not present

## 2016-09-01 DIAGNOSIS — K5900 Constipation, unspecified: Secondary | ICD-10-CM | POA: Diagnosis not present

## 2016-09-01 DIAGNOSIS — R262 Difficulty in walking, not elsewhere classified: Secondary | ICD-10-CM | POA: Diagnosis not present

## 2016-09-01 DIAGNOSIS — J189 Pneumonia, unspecified organism: Secondary | ICD-10-CM | POA: Diagnosis not present

## 2016-09-01 DIAGNOSIS — R630 Anorexia: Secondary | ICD-10-CM | POA: Diagnosis not present

## 2016-09-01 DIAGNOSIS — I10 Essential (primary) hypertension: Secondary | ICD-10-CM | POA: Diagnosis not present

## 2016-09-01 DIAGNOSIS — R0989 Other specified symptoms and signs involving the circulatory and respiratory systems: Secondary | ICD-10-CM | POA: Diagnosis not present

## 2016-09-01 DIAGNOSIS — I209 Angina pectoris, unspecified: Secondary | ICD-10-CM | POA: Diagnosis not present

## 2016-09-01 DIAGNOSIS — J9621 Acute and chronic respiratory failure with hypoxia: Secondary | ICD-10-CM | POA: Diagnosis not present

## 2016-09-01 DIAGNOSIS — E876 Hypokalemia: Secondary | ICD-10-CM | POA: Diagnosis not present

## 2016-09-01 DIAGNOSIS — J45901 Unspecified asthma with (acute) exacerbation: Secondary | ICD-10-CM | POA: Diagnosis not present

## 2016-09-01 DIAGNOSIS — I214 Non-ST elevation (NSTEMI) myocardial infarction: Secondary | ICD-10-CM | POA: Diagnosis not present

## 2016-09-01 DIAGNOSIS — R748 Abnormal levels of other serum enzymes: Secondary | ICD-10-CM | POA: Diagnosis not present

## 2016-09-01 DIAGNOSIS — R739 Hyperglycemia, unspecified: Secondary | ICD-10-CM | POA: Diagnosis not present

## 2016-09-01 DIAGNOSIS — R52 Pain, unspecified: Secondary | ICD-10-CM | POA: Diagnosis not present

## 2016-09-01 DIAGNOSIS — I509 Heart failure, unspecified: Secondary | ICD-10-CM | POA: Diagnosis not present

## 2016-09-01 DIAGNOSIS — J449 Chronic obstructive pulmonary disease, unspecified: Secondary | ICD-10-CM | POA: Diagnosis not present

## 2016-09-01 DIAGNOSIS — M6281 Muscle weakness (generalized): Secondary | ICD-10-CM | POA: Diagnosis not present

## 2016-09-01 DIAGNOSIS — E785 Hyperlipidemia, unspecified: Secondary | ICD-10-CM | POA: Diagnosis not present

## 2016-09-01 DIAGNOSIS — I2510 Atherosclerotic heart disease of native coronary artery without angina pectoris: Secondary | ICD-10-CM | POA: Diagnosis not present

## 2016-09-01 DIAGNOSIS — J9601 Acute respiratory failure with hypoxia: Secondary | ICD-10-CM | POA: Diagnosis not present

## 2016-09-01 DIAGNOSIS — I482 Chronic atrial fibrillation: Secondary | ICD-10-CM | POA: Diagnosis not present

## 2016-09-01 DIAGNOSIS — J181 Lobar pneumonia, unspecified organism: Secondary | ICD-10-CM | POA: Diagnosis not present

## 2016-09-01 LAB — GLUCOSE, CAPILLARY
Glucose-Capillary: 107 mg/dL — ABNORMAL HIGH (ref 65–99)
Glucose-Capillary: 102 mg/dL — ABNORMAL HIGH (ref 65–99)

## 2016-09-01 MED ORDER — GUAIFENESIN ER 600 MG PO TB12: 600.0000 mg | ORAL_TABLET | Freq: Two times a day (BID) | ORAL | Status: AC

## 2016-09-01 MED ORDER — ATORVASTATIN CALCIUM 10 MG PO TABS: 10.0000 mg | ORAL_TABLET | Freq: Every day | ORAL | 2 refills | Status: AC

## 2016-09-01 MED ORDER — BISOPROLOL FUMARATE 10 MG PO TABS: 10.0000 mg | ORAL_TABLET | Freq: Two times a day (BID) | ORAL | 2 refills | Status: AC

## 2016-09-01 MED ORDER — IPRATROPIUM-ALBUTEROL 0.5-2.5 (3) MG/3ML IN SOLN: 3.0000 mL | Freq: Three times a day (TID) | RESPIRATORY_TRACT | 2 refills | Status: AC

## 2016-09-01 NOTE — Discharge Summary (Signed)
Physician Discharge Summary  Jacquiline Zurcher ZOX:[id number] DOB: 22-Jun-1924 DOA: 08/24/2016  PCP: Darrow Bussing, MD  Admit date: 08/24/2016 Discharge date: 09/01/2016  Admitted From: Home Discharge disposition: SNF   Recommendations for Outpatient Follow-Up:   1. The patient does not require Bipap, she has been successfully weaned to nasal cannula, which can be further weaned at [facility].   Discharge Diagnosis:   Principal Problem:    Acute respiratory failure with hypoxia (HCC) Active Problems:    Shortness of breath    Chronic obstructive airway disease with asthma (HCC)    CAP (community acquired pneumonia)    New onset a-fib (HCC)    Hyperglycemia    Chest pain    Elevated troponin    Hypokalemia    Sepsis (HCC)    Hypoxia  Discharge Condition: Improved.  Diet recommendation: Low sodium, heart healthy.  Wound care: None.   History of Present Illness:   [name] is an 82 y.o. female with a PMH of COPD/asthma, hypertension, and CAD on chronic Plavix was admitted 08/24/16 for evaluation of chest pain and dyspnea. Upon initial evaluation in the ED, the patient was noted to be in hypoxic respiratory failure with evidence of pneumonia on chest radiography. She was also found to have atrial fibrillation with RVR. In the ED, she was given 125 mg of Solu-Medrol, bronchodilators, IV fluids and labetalol with little improvement.   Hospital Course by Problem:   Principal Problem:   Acute respiratory failure with hypoxia (HCC) Secondary to community-acquired pneumonia in the setting of an acute exacerbation of chronic obstructive airway disease with asthma/shortness of breath/hypoxia Initially required Bipap, but has been weaned to nasal cannula. Chest x-ray is stable. Completed one week of appropriate antibiotics with Unasyn. Blood cultures negative. Continue Mucinex and flutter valve. Continue bronchodilators. Given one dose of IV Solu-Medrol in the ED which was  not continued. Speech therapy/swallowing evaluation performed 08/31/16, no change in diet.  Active Problems:   Hypertension Continue Avapro and bisoprolol.    New onset a-fib (HCC) CHA2DS2-VASCScore: Risk Score 5. Has maintained normal sinus rhythm. Initially on a cardizem drip, now on a beta blocker. No indication for long-term anticoagulation per cardiology recommendations. TSH WNL. Continue aspirin/Plavix.    Hyperglycemia Likely steroid-induced, improving. Hemoglobin A1c 5.8%.     Chest pain/Elevated troponin ACS not suspected per cardiologist. Coronary angiogram 05/17/2013 [facility], [place]) : Left main: 40% distal lesion, 30-40% lesion in ostium. LAD: Heavily calcified and 70% occluded proximally, fills retrograde by the left circumflex. Left circumflex: Luminal irregularities, torturous. RCA: Large, dominant, 50-70% stenosis in the mid segment and in the distal segment. LVEF 60-65%. Continue medical management. Heparin discontinued 08/27/16. Continue aspirin/Plavix. Continue statin .    Hypokalemia Repleted.    Sepsis (HCC) Blood cultures negative to date. Lactate cleared with IV fluids and empiric antibiotics.  Medical Consultants:    Cardiology  Pulmonology   Discharge Exam:   Vitals:   09/01/16 0627 09/01/16 0937  BP: (!) 123/57 (!) 121/54  Pulse: 77 83  Resp: 20 (!) 21  Temp: 98.2 F (36.8 C)    Vitals:   09/01/16 0627 09/01/16 0753 09/01/16 0935 09/01/16 0937  BP: (!) 123/57   (!) 121/54  Pulse: 77   83  Resp: 20   (!) 21  Temp: 98.2 F (36.8 C)     TempSrc: Axillary     SpO2: 91% 94% 94%   Weight: 46.4 kg (102 lb 4.8 oz)     Height:  General exam: No distress. Respiratory system: Diminished. Respiratory effort mildly increased. Cardiovascular system: S1 & S2 heard, RRR. No JVD,  rubs, gallops or clicks. II/VI SEM. Gastrointestinal system: Abdomen is nondistended, soft and nontender. No organomegaly or masses felt. Normal  bowel sounds heard. Central nervous system: Lethargic, disoriented. Extremities: No clubbing,  or cyanosis. 1+ edema. Skin: No rashes, lesions or ulcers. Psychiatry: Judgement and insight appear diminished. Mood & affect flat.    The results of significant diagnostics from this hospitalization (including imaging, microbiology, ancillary and laboratory) are listed below for reference.     Procedures and Diagnostic Studies:   Dg Chest Portable 1 View  Result Date: 08/24/2016 CLINICAL DATA:  Cough, shortness of breath and wheezing for 4 days. EXAM: PORTABLE CHEST 1 VIEW COMPARISON:  Chest radiograph August 24, 2016 at 1145 hours FINDINGS: Patient now rotated to the LEFT, accentuating the LEFT heart. Cardiomediastinal silhouette is normal, mildly calcified aortic knob. Increased lung volumes and mild chronic interstitial changes. Increasing patchy LEFT lung base airspace opacity. Mild blunting of the costophrenic angles. RIGHT midlung zone granuloma. No pneumothorax. Osteopenia. Soft tissue planes are nonsuspicious. IMPRESSION: Increasing LEFT lung base airspace opacity concerning for pneumonia versus atelectasis in a background of probable COPD. Recommend follow-up PA and lateral views of the chest when clinically able. Small pleural thickening versus pleural thickening. Electronically Signed   By: Awilda Metro M.D.   On: 08/24/2016 15:33   Dg Chest Port 1 View  Result Date: 08/24/2016 CLINICAL DATA:  Chest pain.  Shortness of breath. EXAM: PORTABLE CHEST 1 VIEW COMPARISON:  04/10/2016. FINDINGS: Mediastinum and hilar structures stable. Mild hilar prominence unchanged most likely vascular. Heart size stable. Low lung volumes. Mild bibasilar infiltrates and or edema cannot be completely excluded. Small bilateral pleural effusions versus pleural scarring noted. No pneumothorax. IMPRESSION: 1. Low lung volumes. Mild bibasilar infiltrates and or edema cannot be excluded. Small bilateral pleural  effusions versus pleural scarring noted. 2. Heart size normal. Electronically Signed   By: Maisie Fus  Register   On: 08/24/2016 12:08     Labs:   Basic Metabolic Panel:  Recent Labs Lab 08/26/16 0431 08/27/16 0434 08/28/16 0553 08/31/16 0403  NA 136 138 137 140  K 4.4 4.3 3.8 3.1*  CL 106 107 101 103  CO2 23 25 28 29   GLUCOSE 180* 128* 95 94  BUN 23* 25* 13 14  CREATININE 0.83 0.80 0.69 0.73  CALCIUM 8.5* 8.5* 8.2* 8.8*  MG 2.4  --   --   --   PHOS 2.8  --   --   --    GFR Estimated Creatinine Clearance: 33.6 mL/min (by C-G formula based on SCr of 0.73 mg/dL). Liver Function Tests:  Recent Labs Lab 08/26/16 0431  AST 63*  ALT 53  ALKPHOS 49  BILITOT 0.4  PROT 5.7*  ALBUMIN 2.5*    CBC:  Recent Labs Lab 08/26/16 0431 08/27/16 0434 08/28/16 0553 08/31/16 0403  WBC 16.9* 13.7* 10.3 10.7*  HGB 12.0 12.7 13.7 13.6  HCT 36.0 38.2 41.2 40.9  MCV 86.3 85.8 86.6 85.6  PLT 206 238 225 216   CBG:  Recent Labs Lab 08/31/16 0740 08/31/16 1104 08/31/16 1615 08/31/16 2059 09/01/16 0741  GLUCAP 98 95 149* 145* 107*   Microbiology Recent Results (from the past 240 hour(s))  Culture, blood (Routine x 2)     Status: None   Collection Time: 08/24/16 11:50 AM  Result Value Ref Range Status   Specimen Description BLOOD RIGHT  FOREARM  Final   Special Requests   Final    BOTTLES DRAWN AEROBIC AND ANAEROBIC 4CC AER 5CC ANA   Culture NO GROWTH 5 DAYS  Final   Report Status 08/29/2016 FINAL  Final  Culture, blood (Routine x 2)     Status: None   Collection Time: 08/24/16  2:07 PM  Result Value Ref Range Status   Specimen Description BLOOD LEFT WRIST  Final   Special Requests BOTTLES DRAWN AEROBIC ONLY 5CC  Final   Culture NO GROWTH 5 DAYS  Final   Report Status 08/29/2016 FINAL  Final  Urine culture     Status: None   Collection Time: 08/24/16  2:15 PM  Result Value Ref Range Status   Specimen Description URINE, RANDOM  Final   Special Requests NONE  Final    Culture NO GROWTH  Final   Report Status 08/25/2016 FINAL  Final  MRSA PCR Screening     Status: None   Collection Time: 08/25/16  8:40 AM  Result Value Ref Range Status   MRSA by PCR NEGATIVE NEGATIVE Final    Comment:        The GeneXpert MRSA Assay (FDA approved for NASAL specimens only), is one component of a comprehensive MRSA colonization surveillance program. It is not intended to diagnose MRSA infection nor to guide or monitor treatment for MRSA infections.      Discharge Instructions:   Discharge Instructions    Call MD for:  difficulty breathing, headache or visual disturbances    Complete by:  As directed    Call MD for:  extreme fatigue    Complete by:  As directed    Call MD for:  temperature >100.4    Complete by:  As directed    Diet - low sodium heart healthy    Complete by:  As directed    Increase activity slowly    Complete by:  As directed        Medication List    STOP taking these medications   carvedilol 6.25 MG tablet Commonly known as:  COREG   furosemide 20 MG tablet Commonly known as:  LASIX   levofloxacin 500 MG tablet Commonly known as:  LEVAQUIN   metoprolol succinate 25 MG 24 hr tablet Commonly known as:  TOPROL-XL   simvastatin 20 MG tablet Commonly known as:  ZOCOR     TAKE these medications   ADVAIR DISKUS 100-50 MCG/DOSE Aepb Generic drug:  Fluticasone-Salmeterol Inhale 1 puff into the lungs 2 (two) times daily.   albuterol (2.5 MG/3ML) 0.083% nebulizer solution Commonly known as:  PROVENTIL Take 3 mLs (2.5 mg total) by nebulization every 6 (six) hours as needed for wheezing or shortness of breath.   aspirin 81 MG tablet Take 81 mg by mouth daily.   atorvastatin 10 MG tablet Commonly known as:  LIPITOR Take 1 tablet (10 mg total) by mouth daily at 6 PM.   bisoprolol 10 MG tablet Commonly known as:  ZEBETA Take 1 tablet (10 mg total) by mouth 2 (two) times daily.   Calcium-Magnesium-Zinc 167-83-8 MG  Tabs Take 1 tablet by mouth daily.   clopidogrel 75 MG tablet Commonly known as:  PLAVIX Take 75 mg by mouth every other day. Patient takes on Sunday, Monday, Wednesday, friday   guaiFENesin 600 MG 12 hr tablet Commonly known as:  MUCINEX Take 1 tablet (600 mg total) by mouth 2 (two) times daily.   ipratropium-albuterol 0.5-2.5 (3) MG/3ML Soln Commonly known as:  DUONEB Take 3 mLs by nebulization 3 (three) times daily.   multivitamin with minerals tablet Take 1 tablet by mouth daily.   nitroGLYCERIN 0.4 mg/hr patch Commonly known as:  NITRODUR - Dosed in mg/24 hr   valsartan 80 MG tablet Commonly known as:  DIOVAN Take 80 mg by mouth daily.         Time coordinating discharge: > 35 minutes.  Signed:  Jian Hodgman  Pager [phone] [facility] 09/01/2016, 12:07 PM

## 2016-09-01 NOTE — Care Management Important Message (Signed)
Important Message  Patient Details  Name: Gaspar ColaDevyani Slagel MRN: [id number] Date of Birth: [birth year]   Medicare Important Message Given:  Yes    Linsay Vogt 09/01/2016, 1:21 PM

## 2016-09-01 NOTE — Progress Notes (Signed)
Discharge instruction reviewed with patient's son. Son has no questions at this time. Pt awaiting ride to go to snf.

## 2016-09-01 NOTE — Clinical Social Work Note (Signed)
Clinical Social Worker facilitated patient discharge including contacting patient family and facility to confirm patient discharge plans.  Clinical information faxed to facility and family agreeable with plan.  CSW arranged [facility] [place].  RN to call report prior to discharge.  Clinical Social Worker will sign off for now as social work intervention is no longer needed. Please consult us again if new need arises.  Macario GoldsJesse Maram [place], [place] [phone]

## 2016-09-01 NOTE — Progress Notes (Signed)
Physical Therapy Treatment Patient Details Name: [name] MRN: [id number] DOB: 1924-02-23 Today's Date: 09/01/2016    History of Present Illness is a 82 y.o. female who presents with sepsis and acute respiratory failure likely secondary to CAP with associated atrial fibrillation with RVR and mild elevation of troponin likely secondary to reactive strain.     PT Comments    Patient progressing slowly towards PT goals. More initiation with mobility today. Able to get to EOB with Mod A and stand with Min A. Continues to demonstrate fatigue with activity. Sp02 ranged from 88-93% on RA throughout session. [place] so working on sitting tolerance in chair. Will continue to follow.  Follow Up Recommendations  SNF;Supervision/Assistance - 24 hour     Equipment Recommendations  Rolling walker with 5" wheels;Wheelchair (measurements PT);3in1 (PT)    Recommendations for Other Services       Precautions / Restrictions Precautions Precautions: Fall Restrictions Weight Bearing Restrictions: No    Mobility  Bed Mobility Overal bed mobility: Needs Assistance Bed Mobility: Rolling;Sidelying to Sit Rolling: Min guard Sidelying to sit: Mod assist;HOB elevated       General bed mobility comments: Able to initiate movement of BLEs and reaching for rail to roll; assist with trunk elevation and to scoot bottom to EOB.  Transfers Overall transfer level: Needs assistance Equipment used: Rolling walker (2 wheeled) Transfers: Sit to/from [employer]Stand;Stand Pivot Transfers Sit to Stand: Min assist Stand pivot transfers: Min assist       General transfer comment: Min A to boost from EOB with cues for hand placement/technique. Bil knee buckle on first stand; able to take a few steps to transfer to chair on second bout with Min A for balance and RW management.   Ambulation/Gait                 Stairs            Wheelchair Mobility    Modified  Rankin (Stroke Patients Only)       Balance Overall balance assessment: Needs assistance Sitting-balance support: Feet supported;No upper extremity supported Sitting balance-Leahy Scale: Fair Sitting balance - Comments: Able to sit unsupported for 5 minutes however when fatigued pt with posterior lean and then anterior lean. Able to initiate thoracic and lumbar extension with cues and practiced purse dlip breathing.   Standing balance support: During functional activity;Bilateral upper extremity supported Standing balance-Leahy Scale: Poor Standing balance comment: Reliant on BUEs for support. Stood for 15 sec with BLE giving way.                    Cognition Arousal/Alertness: Lethargic Behavior During Therapy: Flat affect Overall Cognitive Status: History of cognitive impairments - at baseline                      Exercises      General Comments General comments (skin integrity, edema, etc.): Sp02 ranged from 88-93% on RA. Son present for session and concerned as he [place].      Pertinent Vitals/Pain Pain Assessment: Faces Faces Pain Scale: No hurt    Home Living                      Prior Function            PT Goals (current goals can now be found in the care plan section) Progress towards  PT goals: Progressing toward goals    Frequency    Min 2X/week      PT Plan Current plan remains appropriate    Co-evaluation             End of Session Equipment Utilized During Treatment: Gait belt Activity Tolerance: Patient limited by fatigue Patient left: in chair;with call bell/phone within reach;with family/visitor present;with nursing/sitter in room     Time: 7829-5621 PT Time Calculation (min) (ACUTE ONLY): 24 min  Charges:  $Therapeutic Activity: 23-37 mins                    G Codes:      Adriona Kaney A Mell Mellott 09/01/2016, 9:54 AM Mylo Red, PT, DPT [phone]

## 2016-09-01 NOTE — Progress Notes (Signed)
[facility]

## 2016-09-01 NOTE — Clinical Social Work Placement (Signed)
   CLINICAL SOCIAL WORK PLACEMENT  NOTE  Date:  09/01/2016  Patient Details  Name: Gaspar ColaDevyani Bermingham MRN: [id number] Date of Birth: August 01, 1924  Clinical Social Work is seeking post-discharge placement for this patient at the Skilled  Nursing Facility level of care (*CSW will initial, date and re-position this form in  chart as items are completed):  Yes   Patient/family provided with [facility] Clinical Social Work Department's list of facilities offering this level of care within the geographic area requested by the patient (or if unable, by the patient's family).  Yes   Patient/family informed of their freedom to choose among providers that offer the needed level of care, that participate in Medicare, Medicaid or managed care program needed by the patient, have an available bed and are willing to accept the patient.  Yes   Patient/family informed of [facility] ownership interest [facility] and [facility], as well as of the fact that they are under no obligation to receive care at these facilities.  PASRR submitted to EDS on       PASRR number received on       Existing PASRR number confirmed on       FL2 transmitted to all facilities in geographic area requested by pt/family on 08/31/16     FL2 transmitted to all facilities within larger geographic area on       Patient informed that his/her managed care company has contracts with or will negotiate with certain facilities, including the following:        Yes   Patient/family informed of bed offers received.  Patient chooses bed at Other - please specify in the comment section below: [facility])     Physician recommends and patient chooses bed at      Patient to be transferred to Other - please specify in the comment section below: [facility]) on 09/01/16.  [facility]     Patient family notified on 09/01/16 of transfer.  Name of family member notified:   Patient son at bedside     PHYSICIAN Please sign DNR     Additional Comment:    Macario GoldsJesse Aila Terra, LCSW [phone]

## 2016-09-02 DIAGNOSIS — J159 Unspecified bacterial pneumonia: Secondary | ICD-10-CM | POA: Diagnosis not present

## 2016-09-02 DIAGNOSIS — I482 Chronic atrial fibrillation: Secondary | ICD-10-CM | POA: Diagnosis not present

## 2016-09-02 DIAGNOSIS — K5900 Constipation, unspecified: Secondary | ICD-10-CM | POA: Diagnosis not present

## 2016-09-02 DIAGNOSIS — I10 Essential (primary) hypertension: Secondary | ICD-10-CM | POA: Diagnosis not present

## 2016-09-02 DIAGNOSIS — J449 Chronic obstructive pulmonary disease, unspecified: Secondary | ICD-10-CM | POA: Diagnosis not present

## 2016-09-02 DIAGNOSIS — R52 Pain, unspecified: Secondary | ICD-10-CM | POA: Diagnosis not present

## 2016-09-04 DIAGNOSIS — R52 Pain, unspecified: Secondary | ICD-10-CM | POA: Diagnosis not present

## 2016-09-04 DIAGNOSIS — K5900 Constipation, unspecified: Secondary | ICD-10-CM | POA: Diagnosis not present

## 2016-09-07 DIAGNOSIS — J189 Pneumonia, unspecified organism: Secondary | ICD-10-CM | POA: Diagnosis not present

## 2016-09-07 DIAGNOSIS — J45909 Unspecified asthma, uncomplicated: Secondary | ICD-10-CM | POA: Diagnosis not present

## 2016-09-07 DIAGNOSIS — J449 Chronic obstructive pulmonary disease, unspecified: Secondary | ICD-10-CM | POA: Diagnosis not present

## 2016-09-09 DIAGNOSIS — E876 Hypokalemia: Secondary | ICD-10-CM | POA: Diagnosis not present

## 2016-09-09 DIAGNOSIS — I10 Essential (primary) hypertension: Secondary | ICD-10-CM | POA: Diagnosis not present

## 2016-09-10 DIAGNOSIS — R52 Pain, unspecified: Secondary | ICD-10-CM | POA: Diagnosis not present

## 2016-09-10 DIAGNOSIS — K5900 Constipation, unspecified: Secondary | ICD-10-CM | POA: Diagnosis not present

## 2016-09-16 DIAGNOSIS — I10 Essential (primary) hypertension: Secondary | ICD-10-CM | POA: Diagnosis not present

## 2016-09-16 DIAGNOSIS — E876 Hypokalemia: Secondary | ICD-10-CM | POA: Diagnosis not present

## 2016-09-22 DIAGNOSIS — E785 Hyperlipidemia, unspecified: Secondary | ICD-10-CM | POA: Diagnosis not present

## 2016-09-22 DIAGNOSIS — I2510 Atherosclerotic heart disease of native coronary artery without angina pectoris: Secondary | ICD-10-CM | POA: Diagnosis not present

## 2016-09-22 DIAGNOSIS — I10 Essential (primary) hypertension: Secondary | ICD-10-CM | POA: Diagnosis not present

## 2016-09-22 DIAGNOSIS — I509 Heart failure, unspecified: Secondary | ICD-10-CM | POA: Diagnosis not present

## 2016-09-24 DIAGNOSIS — I509 Heart failure, unspecified: Secondary | ICD-10-CM | POA: Diagnosis not present

## 2016-09-27 DIAGNOSIS — R630 Anorexia: Secondary | ICD-10-CM | POA: Diagnosis not present

## 2016-09-27 DIAGNOSIS — R531 Weakness: Secondary | ICD-10-CM | POA: Diagnosis not present

## 2016-09-27 DIAGNOSIS — R251 Tremor, unspecified: Secondary | ICD-10-CM | POA: Diagnosis not present

## 2016-09-29 DIAGNOSIS — I509 Heart failure, unspecified: Secondary | ICD-10-CM | POA: Diagnosis not present

## 2016-09-29 DIAGNOSIS — I10 Essential (primary) hypertension: Secondary | ICD-10-CM | POA: Diagnosis not present

## 2016-09-29 DIAGNOSIS — I214 Non-ST elevation (NSTEMI) myocardial infarction: Secondary | ICD-10-CM | POA: Diagnosis not present

## 2016-09-29 DIAGNOSIS — I2510 Atherosclerotic heart disease of native coronary artery without angina pectoris: Secondary | ICD-10-CM | POA: Diagnosis not present

## 2016-09-30 DIAGNOSIS — J159 Unspecified bacterial pneumonia: Secondary | ICD-10-CM | POA: Diagnosis not present

## 2016-09-30 DIAGNOSIS — I482 Chronic atrial fibrillation: Secondary | ICD-10-CM | POA: Diagnosis not present

## 2016-09-30 DIAGNOSIS — J449 Chronic obstructive pulmonary disease, unspecified: Secondary | ICD-10-CM | POA: Diagnosis not present

## 2016-09-30 DIAGNOSIS — I10 Essential (primary) hypertension: Secondary | ICD-10-CM | POA: Diagnosis not present

## 2016-10-20 DIAGNOSIS — I2510 Atherosclerotic heart disease of native coronary artery without angina pectoris: Secondary | ICD-10-CM | POA: Diagnosis not present

## 2016-10-20 DIAGNOSIS — F329 Major depressive disorder, single episode, unspecified: Secondary | ICD-10-CM | POA: Diagnosis not present

## 2016-10-20 DIAGNOSIS — J45909 Unspecified asthma, uncomplicated: Secondary | ICD-10-CM | POA: Diagnosis not present

## 2016-10-20 DIAGNOSIS — I10 Essential (primary) hypertension: Secondary | ICD-10-CM | POA: Diagnosis not present

## 2016-10-20 DIAGNOSIS — R630 Anorexia: Secondary | ICD-10-CM | POA: Diagnosis not present

## 2016-10-20 DIAGNOSIS — E784 Other hyperlipidemia: Secondary | ICD-10-CM | POA: Diagnosis not present

## 2016-10-22 DIAGNOSIS — I739 Peripheral vascular disease, unspecified: Secondary | ICD-10-CM | POA: Diagnosis not present

## 2016-10-22 DIAGNOSIS — M79674 Pain in right toe(s): Secondary | ICD-10-CM | POA: Diagnosis not present

## 2016-10-22 DIAGNOSIS — B351 Tinea unguium: Secondary | ICD-10-CM | POA: Diagnosis not present

## 2016-10-22 DIAGNOSIS — M79675 Pain in left toe(s): Secondary | ICD-10-CM | POA: Diagnosis not present

## 2016-11-12 ENCOUNTER — Ambulatory Visit: Payer: Medicare Other | Admitting: Podiatry

## 2016-11-20 DIAGNOSIS — I509 Heart failure, unspecified: Secondary | ICD-10-CM | POA: Diagnosis not present

## 2016-11-20 DIAGNOSIS — I2510 Atherosclerotic heart disease of native coronary artery without angina pectoris: Secondary | ICD-10-CM | POA: Diagnosis not present

## 2016-11-20 DIAGNOSIS — I10 Essential (primary) hypertension: Secondary | ICD-10-CM | POA: Diagnosis not present

## 2016-11-20 DIAGNOSIS — M158 Other polyosteoarthritis: Secondary | ICD-10-CM | POA: Diagnosis not present

## 2016-11-20 DIAGNOSIS — J4520 Mild intermittent asthma, uncomplicated: Secondary | ICD-10-CM | POA: Diagnosis not present

## 2016-11-20 DIAGNOSIS — M6281 Muscle weakness (generalized): Secondary | ICD-10-CM | POA: Diagnosis not present

## 2016-11-23 DIAGNOSIS — I2510 Atherosclerotic heart disease of native coronary artery without angina pectoris: Secondary | ICD-10-CM | POA: Diagnosis not present

## 2016-11-23 DIAGNOSIS — I10 Essential (primary) hypertension: Secondary | ICD-10-CM | POA: Diagnosis not present

## 2016-11-23 DIAGNOSIS — M6281 Muscle weakness (generalized): Secondary | ICD-10-CM | POA: Diagnosis not present

## 2016-11-23 DIAGNOSIS — I509 Heart failure, unspecified: Secondary | ICD-10-CM | POA: Diagnosis not present

## 2016-11-23 DIAGNOSIS — J4520 Mild intermittent asthma, uncomplicated: Secondary | ICD-10-CM | POA: Diagnosis not present

## 2016-11-23 DIAGNOSIS — M158 Other polyosteoarthritis: Secondary | ICD-10-CM | POA: Diagnosis not present

## 2016-11-24 DIAGNOSIS — I10 Essential (primary) hypertension: Secondary | ICD-10-CM | POA: Diagnosis not present

## 2016-11-24 DIAGNOSIS — I2510 Atherosclerotic heart disease of native coronary artery without angina pectoris: Secondary | ICD-10-CM | POA: Diagnosis not present

## 2016-11-24 DIAGNOSIS — I509 Heart failure, unspecified: Secondary | ICD-10-CM | POA: Diagnosis not present

## 2016-11-24 DIAGNOSIS — J4520 Mild intermittent asthma, uncomplicated: Secondary | ICD-10-CM | POA: Diagnosis not present

## 2016-11-24 DIAGNOSIS — M6281 Muscle weakness (generalized): Secondary | ICD-10-CM | POA: Diagnosis not present

## 2016-11-24 DIAGNOSIS — M158 Other polyosteoarthritis: Secondary | ICD-10-CM | POA: Diagnosis not present

## 2016-11-25 DIAGNOSIS — M6281 Muscle weakness (generalized): Secondary | ICD-10-CM | POA: Diagnosis not present

## 2016-11-25 DIAGNOSIS — I10 Essential (primary) hypertension: Secondary | ICD-10-CM | POA: Diagnosis not present

## 2016-11-25 DIAGNOSIS — I509 Heart failure, unspecified: Secondary | ICD-10-CM | POA: Diagnosis not present

## 2016-11-25 DIAGNOSIS — I2510 Atherosclerotic heart disease of native coronary artery without angina pectoris: Secondary | ICD-10-CM | POA: Diagnosis not present

## 2016-11-25 DIAGNOSIS — M158 Other polyosteoarthritis: Secondary | ICD-10-CM | POA: Diagnosis not present

## 2016-11-25 DIAGNOSIS — J4520 Mild intermittent asthma, uncomplicated: Secondary | ICD-10-CM | POA: Diagnosis not present

## 2016-11-26 DIAGNOSIS — I2510 Atherosclerotic heart disease of native coronary artery without angina pectoris: Secondary | ICD-10-CM | POA: Diagnosis not present

## 2016-11-26 DIAGNOSIS — I214 Non-ST elevation (NSTEMI) myocardial infarction: Secondary | ICD-10-CM | POA: Diagnosis not present

## 2016-11-26 DIAGNOSIS — I10 Essential (primary) hypertension: Secondary | ICD-10-CM | POA: Diagnosis not present

## 2016-11-26 DIAGNOSIS — I509 Heart failure, unspecified: Secondary | ICD-10-CM | POA: Diagnosis not present

## 2016-12-09 DIAGNOSIS — F329 Major depressive disorder, single episode, unspecified: Secondary | ICD-10-CM | POA: Diagnosis not present

## 2016-12-09 DIAGNOSIS — E7800 Pure hypercholesterolemia, unspecified: Secondary | ICD-10-CM | POA: Diagnosis not present

## 2016-12-09 DIAGNOSIS — I25118 Atherosclerotic heart disease of native coronary artery with other forms of angina pectoris: Secondary | ICD-10-CM | POA: Diagnosis not present

## 2016-12-09 DIAGNOSIS — I10 Essential (primary) hypertension: Secondary | ICD-10-CM | POA: Diagnosis not present

## 2016-12-09 DIAGNOSIS — I5022 Chronic systolic (congestive) heart failure: Secondary | ICD-10-CM | POA: Diagnosis not present

## 2016-12-09 DIAGNOSIS — L89321 Pressure ulcer of left buttock, stage 1: Secondary | ICD-10-CM | POA: Diagnosis not present

## 2016-12-31 DIAGNOSIS — R252 Cramp and spasm: Secondary | ICD-10-CM | POA: Diagnosis not present

## 2016-12-31 DIAGNOSIS — I10 Essential (primary) hypertension: Secondary | ICD-10-CM | POA: Diagnosis not present

## 2016-12-31 DIAGNOSIS — L89321 Pressure ulcer of left buttock, stage 1: Secondary | ICD-10-CM | POA: Diagnosis not present

## 2016-12-31 DIAGNOSIS — I25118 Atherosclerotic heart disease of native coronary artery with other forms of angina pectoris: Secondary | ICD-10-CM | POA: Diagnosis not present

## 2016-12-31 DIAGNOSIS — F329 Major depressive disorder, single episode, unspecified: Secondary | ICD-10-CM | POA: Diagnosis not present

## 2016-12-31 DIAGNOSIS — I5022 Chronic systolic (congestive) heart failure: Secondary | ICD-10-CM | POA: Diagnosis not present

## 2016-12-31 DIAGNOSIS — E7800 Pure hypercholesterolemia, unspecified: Secondary | ICD-10-CM | POA: Diagnosis not present

## 2017-02-22 DIAGNOSIS — J449 Chronic obstructive pulmonary disease, unspecified: Secondary | ICD-10-CM | POA: Diagnosis not present

## 2017-02-22 DIAGNOSIS — I10 Essential (primary) hypertension: Secondary | ICD-10-CM | POA: Diagnosis not present

## 2017-02-22 DIAGNOSIS — F329 Major depressive disorder, single episode, unspecified: Secondary | ICD-10-CM | POA: Diagnosis not present

## 2017-02-22 DIAGNOSIS — L89321 Pressure ulcer of left buttock, stage 1: Secondary | ICD-10-CM | POA: Diagnosis not present

## 2017-02-22 DIAGNOSIS — I25118 Atherosclerotic heart disease of native coronary artery with other forms of angina pectoris: Secondary | ICD-10-CM | POA: Diagnosis not present

## 2017-02-22 DIAGNOSIS — E7800 Pure hypercholesterolemia, unspecified: Secondary | ICD-10-CM | POA: Diagnosis not present

## 2017-02-22 DIAGNOSIS — B351 Tinea unguium: Secondary | ICD-10-CM | POA: Diagnosis not present

## 2017-03-15 DIAGNOSIS — B353 Tinea pedis: Secondary | ICD-10-CM | POA: Diagnosis not present

## 2017-03-15 DIAGNOSIS — B351 Tinea unguium: Secondary | ICD-10-CM | POA: Diagnosis not present

## 2017-03-24 DIAGNOSIS — I509 Heart failure, unspecified: Secondary | ICD-10-CM | POA: Diagnosis not present

## 2017-03-24 DIAGNOSIS — I420 Dilated cardiomyopathy: Secondary | ICD-10-CM | POA: Diagnosis not present

## 2017-03-24 DIAGNOSIS — I2510 Atherosclerotic heart disease of native coronary artery without angina pectoris: Secondary | ICD-10-CM | POA: Diagnosis not present

## 2017-03-24 DIAGNOSIS — R0602 Shortness of breath: Secondary | ICD-10-CM | POA: Diagnosis not present

## 2017-03-24 DIAGNOSIS — I5041 Acute combined systolic (congestive) and diastolic (congestive) heart failure: Secondary | ICD-10-CM | POA: Diagnosis not present

## 2017-03-24 DIAGNOSIS — I10 Essential (primary) hypertension: Secondary | ICD-10-CM | POA: Diagnosis not present

## 2017-03-31 DIAGNOSIS — F329 Major depressive disorder, single episode, unspecified: Secondary | ICD-10-CM | POA: Diagnosis not present

## 2017-03-31 DIAGNOSIS — I25118 Atherosclerotic heart disease of native coronary artery with other forms of angina pectoris: Secondary | ICD-10-CM | POA: Diagnosis not present

## 2017-03-31 DIAGNOSIS — I10 Essential (primary) hypertension: Secondary | ICD-10-CM | POA: Diagnosis not present

## 2017-03-31 DIAGNOSIS — J449 Chronic obstructive pulmonary disease, unspecified: Secondary | ICD-10-CM | POA: Diagnosis not present

## 2017-03-31 DIAGNOSIS — B351 Tinea unguium: Secondary | ICD-10-CM | POA: Diagnosis not present

## 2017-03-31 DIAGNOSIS — L89321 Pressure ulcer of left buttock, stage 1: Secondary | ICD-10-CM | POA: Diagnosis not present

## 2017-03-31 DIAGNOSIS — E7800 Pure hypercholesterolemia, unspecified: Secondary | ICD-10-CM | POA: Diagnosis not present

## 2017-03-31 DIAGNOSIS — F339 Major depressive disorder, recurrent, unspecified: Secondary | ICD-10-CM | POA: Diagnosis not present

## 2017-04-22 DIAGNOSIS — E7800 Pure hypercholesterolemia, unspecified: Secondary | ICD-10-CM | POA: Diagnosis not present

## 2017-04-22 DIAGNOSIS — J449 Chronic obstructive pulmonary disease, unspecified: Secondary | ICD-10-CM | POA: Diagnosis not present

## 2017-04-22 DIAGNOSIS — I25118 Atherosclerotic heart disease of native coronary artery with other forms of angina pectoris: Secondary | ICD-10-CM | POA: Diagnosis not present

## 2017-04-22 DIAGNOSIS — I5022 Chronic systolic (congestive) heart failure: Secondary | ICD-10-CM | POA: Diagnosis not present

## 2017-04-22 DIAGNOSIS — L89321 Pressure ulcer of left buttock, stage 1: Secondary | ICD-10-CM | POA: Diagnosis not present

## 2017-04-22 DIAGNOSIS — F329 Major depressive disorder, single episode, unspecified: Secondary | ICD-10-CM | POA: Diagnosis not present

## 2017-04-22 DIAGNOSIS — B351 Tinea unguium: Secondary | ICD-10-CM | POA: Diagnosis not present

## 2017-04-22 DIAGNOSIS — F339 Major depressive disorder, recurrent, unspecified: Secondary | ICD-10-CM | POA: Diagnosis not present

## 2017-04-22 DIAGNOSIS — I10 Essential (primary) hypertension: Secondary | ICD-10-CM | POA: Diagnosis not present

## 2017-04-23 DIAGNOSIS — I5041 Acute combined systolic (congestive) and diastolic (congestive) heart failure: Secondary | ICD-10-CM | POA: Diagnosis not present

## 2017-05-03 DIAGNOSIS — I5041 Acute combined systolic (congestive) and diastolic (congestive) heart failure: Secondary | ICD-10-CM | POA: Diagnosis not present

## 2017-05-03 DIAGNOSIS — I2510 Atherosclerotic heart disease of native coronary artery without angina pectoris: Secondary | ICD-10-CM | POA: Diagnosis not present

## 2017-05-03 DIAGNOSIS — I429 Cardiomyopathy, unspecified: Secondary | ICD-10-CM | POA: Diagnosis not present

## 2017-05-03 DIAGNOSIS — R0602 Shortness of breath: Secondary | ICD-10-CM | POA: Diagnosis not present

## 2017-05-03 DIAGNOSIS — R0789 Other chest pain: Secondary | ICD-10-CM | POA: Diagnosis not present

## 2017-05-03 DIAGNOSIS — I509 Heart failure, unspecified: Secondary | ICD-10-CM | POA: Diagnosis not present

## 2017-05-10 DIAGNOSIS — Z23 Encounter for immunization: Secondary | ICD-10-CM | POA: Diagnosis not present

## 2017-05-10 DIAGNOSIS — R0602 Shortness of breath: Secondary | ICD-10-CM | POA: Diagnosis not present

## 2017-05-10 DIAGNOSIS — J45909 Unspecified asthma, uncomplicated: Secondary | ICD-10-CM | POA: Diagnosis not present

## 2017-05-10 DIAGNOSIS — J42 Unspecified chronic bronchitis: Secondary | ICD-10-CM | POA: Diagnosis not present

## 2017-05-14 DIAGNOSIS — R000 Tachycardia, unspecified: Secondary | ICD-10-CM | POA: Diagnosis not present

## 2017-05-14 DIAGNOSIS — R0602 Shortness of breath: Secondary | ICD-10-CM | POA: Diagnosis not present

## 2017-05-14 DIAGNOSIS — J441 Chronic obstructive pulmonary disease with (acute) exacerbation: Secondary | ICD-10-CM | POA: Diagnosis not present

## 2017-05-14 DIAGNOSIS — R05 Cough: Secondary | ICD-10-CM | POA: Diagnosis not present

## 2017-05-14 DIAGNOSIS — R062 Wheezing: Secondary | ICD-10-CM | POA: Diagnosis not present

## 2017-05-17 DIAGNOSIS — J209 Acute bronchitis, unspecified: Secondary | ICD-10-CM | POA: Diagnosis not present

## 2017-05-17 DIAGNOSIS — I25118 Atherosclerotic heart disease of native coronary artery with other forms of angina pectoris: Secondary | ICD-10-CM | POA: Diagnosis not present

## 2017-05-17 DIAGNOSIS — I10 Essential (primary) hypertension: Secondary | ICD-10-CM | POA: Diagnosis not present

## 2017-05-17 DIAGNOSIS — I5022 Chronic systolic (congestive) heart failure: Secondary | ICD-10-CM | POA: Diagnosis not present

## 2017-05-17 DIAGNOSIS — J441 Chronic obstructive pulmonary disease with (acute) exacerbation: Secondary | ICD-10-CM | POA: Diagnosis not present

## 2017-05-17 DIAGNOSIS — R05 Cough: Secondary | ICD-10-CM | POA: Diagnosis not present

## 2017-05-17 DIAGNOSIS — K5901 Slow transit constipation: Secondary | ICD-10-CM | POA: Diagnosis not present

## 2017-05-17 DIAGNOSIS — R062 Wheezing: Secondary | ICD-10-CM | POA: Diagnosis not present

## 2017-05-20 DIAGNOSIS — R630 Anorexia: Secondary | ICD-10-CM | POA: Diagnosis not present

## 2017-05-20 DIAGNOSIS — J209 Acute bronchitis, unspecified: Secondary | ICD-10-CM | POA: Diagnosis not present

## 2017-05-20 DIAGNOSIS — I10 Essential (primary) hypertension: Secondary | ICD-10-CM | POA: Diagnosis not present

## 2017-05-20 DIAGNOSIS — R062 Wheezing: Secondary | ICD-10-CM | POA: Diagnosis not present

## 2017-05-20 DIAGNOSIS — J441 Chronic obstructive pulmonary disease with (acute) exacerbation: Secondary | ICD-10-CM | POA: Diagnosis not present

## 2017-05-20 DIAGNOSIS — R05 Cough: Secondary | ICD-10-CM | POA: Diagnosis not present

## 2017-05-20 DIAGNOSIS — E7800 Pure hypercholesterolemia, unspecified: Secondary | ICD-10-CM | POA: Diagnosis not present

## 2017-05-20 DIAGNOSIS — I25118 Atherosclerotic heart disease of native coronary artery with other forms of angina pectoris: Secondary | ICD-10-CM | POA: Diagnosis not present

## 2017-05-20 DIAGNOSIS — R262 Difficulty in walking, not elsewhere classified: Secondary | ICD-10-CM | POA: Diagnosis not present

## 2017-05-20 DIAGNOSIS — F339 Major depressive disorder, recurrent, unspecified: Secondary | ICD-10-CM | POA: Diagnosis not present

## 2017-05-25 DIAGNOSIS — J441 Chronic obstructive pulmonary disease with (acute) exacerbation: Secondary | ICD-10-CM | POA: Diagnosis not present

## 2017-05-25 DIAGNOSIS — R630 Anorexia: Secondary | ICD-10-CM | POA: Diagnosis not present

## 2017-05-25 DIAGNOSIS — J209 Acute bronchitis, unspecified: Secondary | ICD-10-CM | POA: Diagnosis not present

## 2017-05-25 DIAGNOSIS — R062 Wheezing: Secondary | ICD-10-CM | POA: Diagnosis not present

## 2017-05-25 DIAGNOSIS — E7800 Pure hypercholesterolemia, unspecified: Secondary | ICD-10-CM | POA: Diagnosis not present

## 2017-05-25 DIAGNOSIS — R262 Difficulty in walking, not elsewhere classified: Secondary | ICD-10-CM | POA: Diagnosis not present

## 2017-05-25 DIAGNOSIS — I10 Essential (primary) hypertension: Secondary | ICD-10-CM | POA: Diagnosis not present

## 2017-05-25 DIAGNOSIS — R05 Cough: Secondary | ICD-10-CM | POA: Diagnosis not present

## 2017-06-01 DIAGNOSIS — I5022 Chronic systolic (congestive) heart failure: Secondary | ICD-10-CM | POA: Diagnosis not present

## 2017-06-01 DIAGNOSIS — J441 Chronic obstructive pulmonary disease with (acute) exacerbation: Secondary | ICD-10-CM | POA: Diagnosis not present

## 2017-06-01 DIAGNOSIS — I10 Essential (primary) hypertension: Secondary | ICD-10-CM | POA: Diagnosis not present

## 2017-06-01 DIAGNOSIS — J209 Acute bronchitis, unspecified: Secondary | ICD-10-CM | POA: Diagnosis not present

## 2017-06-21 DIAGNOSIS — I5022 Chronic systolic (congestive) heart failure: Secondary | ICD-10-CM | POA: Diagnosis not present

## 2017-06-21 DIAGNOSIS — I10 Essential (primary) hypertension: Secondary | ICD-10-CM | POA: Diagnosis not present

## 2017-06-21 DIAGNOSIS — F329 Major depressive disorder, single episode, unspecified: Secondary | ICD-10-CM | POA: Diagnosis not present

## 2017-06-21 DIAGNOSIS — E7800 Pure hypercholesterolemia, unspecified: Secondary | ICD-10-CM | POA: Diagnosis not present

## 2017-06-21 DIAGNOSIS — R600 Localized edema: Secondary | ICD-10-CM | POA: Diagnosis not present

## 2017-06-21 DIAGNOSIS — R630 Anorexia: Secondary | ICD-10-CM | POA: Diagnosis not present

## 2017-06-21 DIAGNOSIS — J441 Chronic obstructive pulmonary disease with (acute) exacerbation: Secondary | ICD-10-CM | POA: Diagnosis not present

## 2017-06-21 DIAGNOSIS — K5901 Slow transit constipation: Secondary | ICD-10-CM | POA: Diagnosis not present

## 2017-06-21 DIAGNOSIS — I25118 Atherosclerotic heart disease of native coronary artery with other forms of angina pectoris: Secondary | ICD-10-CM | POA: Diagnosis not present

## 2017-07-09 DIAGNOSIS — K5901 Slow transit constipation: Secondary | ICD-10-CM | POA: Diagnosis not present

## 2017-07-09 DIAGNOSIS — J441 Chronic obstructive pulmonary disease with (acute) exacerbation: Secondary | ICD-10-CM | POA: Diagnosis not present

## 2017-07-09 DIAGNOSIS — R600 Localized edema: Secondary | ICD-10-CM | POA: Diagnosis not present

## 2017-07-09 DIAGNOSIS — I5022 Chronic systolic (congestive) heart failure: Secondary | ICD-10-CM | POA: Diagnosis not present

## 2017-07-09 DIAGNOSIS — R630 Anorexia: Secondary | ICD-10-CM | POA: Diagnosis not present

## 2017-07-09 DIAGNOSIS — F329 Major depressive disorder, single episode, unspecified: Secondary | ICD-10-CM | POA: Diagnosis not present

## 2017-07-09 DIAGNOSIS — I10 Essential (primary) hypertension: Secondary | ICD-10-CM | POA: Diagnosis not present

## 2017-07-14 DIAGNOSIS — J45909 Unspecified asthma, uncomplicated: Secondary | ICD-10-CM | POA: Diagnosis not present

## 2017-07-14 DIAGNOSIS — R0602 Shortness of breath: Secondary | ICD-10-CM | POA: Diagnosis not present

## 2017-07-15 DIAGNOSIS — I25118 Atherosclerotic heart disease of native coronary artery with other forms of angina pectoris: Secondary | ICD-10-CM | POA: Diagnosis not present

## 2017-07-15 DIAGNOSIS — K5901 Slow transit constipation: Secondary | ICD-10-CM | POA: Diagnosis not present

## 2017-07-15 DIAGNOSIS — F329 Major depressive disorder, single episode, unspecified: Secondary | ICD-10-CM | POA: Diagnosis not present

## 2017-07-15 DIAGNOSIS — R630 Anorexia: Secondary | ICD-10-CM | POA: Diagnosis not present

## 2017-07-15 DIAGNOSIS — J441 Chronic obstructive pulmonary disease with (acute) exacerbation: Secondary | ICD-10-CM | POA: Diagnosis not present

## 2017-07-15 DIAGNOSIS — I5022 Chronic systolic (congestive) heart failure: Secondary | ICD-10-CM | POA: Diagnosis not present

## 2017-07-15 DIAGNOSIS — R600 Localized edema: Secondary | ICD-10-CM | POA: Diagnosis not present

## 2017-07-15 DIAGNOSIS — I10 Essential (primary) hypertension: Secondary | ICD-10-CM | POA: Diagnosis not present

## 2017-07-15 DIAGNOSIS — E7800 Pure hypercholesterolemia, unspecified: Secondary | ICD-10-CM | POA: Diagnosis not present

## 2017-08-12 DIAGNOSIS — K5909 Other constipation: Secondary | ICD-10-CM | POA: Diagnosis not present

## 2017-08-12 DIAGNOSIS — I5022 Chronic systolic (congestive) heart failure: Secondary | ICD-10-CM | POA: Diagnosis not present

## 2017-08-12 DIAGNOSIS — J441 Chronic obstructive pulmonary disease with (acute) exacerbation: Secondary | ICD-10-CM | POA: Diagnosis not present

## 2017-08-12 DIAGNOSIS — R262 Difficulty in walking, not elsewhere classified: Secondary | ICD-10-CM | POA: Diagnosis not present

## 2017-08-12 DIAGNOSIS — E7800 Pure hypercholesterolemia, unspecified: Secondary | ICD-10-CM | POA: Diagnosis not present

## 2017-08-12 DIAGNOSIS — I25118 Atherosclerotic heart disease of native coronary artery with other forms of angina pectoris: Secondary | ICD-10-CM | POA: Diagnosis not present

## 2017-08-12 DIAGNOSIS — S00411A Abrasion of right ear, initial encounter: Secondary | ICD-10-CM | POA: Diagnosis not present

## 2017-08-12 DIAGNOSIS — L89321 Pressure ulcer of left buttock, stage 1: Secondary | ICD-10-CM | POA: Diagnosis not present

## 2017-08-12 DIAGNOSIS — S0083XA Contusion of other part of head, initial encounter: Secondary | ICD-10-CM | POA: Diagnosis not present

## 2017-08-12 DIAGNOSIS — I10 Essential (primary) hypertension: Secondary | ICD-10-CM | POA: Diagnosis not present

## 2017-08-12 DIAGNOSIS — Z23 Encounter for immunization: Secondary | ICD-10-CM | POA: Diagnosis not present

## 2017-08-12 DIAGNOSIS — W19XXXA Unspecified fall, initial encounter: Secondary | ICD-10-CM | POA: Diagnosis not present

## 2017-08-20 DIAGNOSIS — R262 Difficulty in walking, not elsewhere classified: Secondary | ICD-10-CM | POA: Diagnosis not present

## 2017-08-20 DIAGNOSIS — I25118 Atherosclerotic heart disease of native coronary artery with other forms of angina pectoris: Secondary | ICD-10-CM | POA: Diagnosis not present

## 2017-08-20 DIAGNOSIS — Z0000 Encounter for general adult medical examination without abnormal findings: Secondary | ICD-10-CM | POA: Diagnosis not present

## 2017-08-20 DIAGNOSIS — H9202 Otalgia, left ear: Secondary | ICD-10-CM | POA: Diagnosis not present

## 2017-08-20 DIAGNOSIS — K5901 Slow transit constipation: Secondary | ICD-10-CM | POA: Diagnosis not present

## 2017-08-20 DIAGNOSIS — E7800 Pure hypercholesterolemia, unspecified: Secondary | ICD-10-CM | POA: Diagnosis not present

## 2017-08-20 DIAGNOSIS — I10 Essential (primary) hypertension: Secondary | ICD-10-CM | POA: Diagnosis not present

## 2017-08-20 DIAGNOSIS — J418 Mixed simple and mucopurulent chronic bronchitis: Secondary | ICD-10-CM | POA: Diagnosis not present

## 2017-08-20 DIAGNOSIS — L89321 Pressure ulcer of left buttock, stage 1: Secondary | ICD-10-CM | POA: Diagnosis not present

## 2017-08-20 DIAGNOSIS — S00411A Abrasion of right ear, initial encounter: Secondary | ICD-10-CM | POA: Diagnosis not present

## 2017-08-20 DIAGNOSIS — K5909 Other constipation: Secondary | ICD-10-CM | POA: Diagnosis not present

## 2017-08-20 DIAGNOSIS — I5022 Chronic systolic (congestive) heart failure: Secondary | ICD-10-CM | POA: Diagnosis not present

## 2017-08-23 DIAGNOSIS — I25118 Atherosclerotic heart disease of native coronary artery with other forms of angina pectoris: Secondary | ICD-10-CM | POA: Diagnosis not present

## 2017-08-23 DIAGNOSIS — R262 Difficulty in walking, not elsewhere classified: Secondary | ICD-10-CM | POA: Diagnosis not present

## 2017-08-23 DIAGNOSIS — I5022 Chronic systolic (congestive) heart failure: Secondary | ICD-10-CM | POA: Diagnosis not present

## 2017-08-23 DIAGNOSIS — S00411A Abrasion of right ear, initial encounter: Secondary | ICD-10-CM | POA: Diagnosis not present

## 2017-08-23 DIAGNOSIS — Z23 Encounter for immunization: Secondary | ICD-10-CM | POA: Diagnosis not present

## 2017-08-23 DIAGNOSIS — K5909 Other constipation: Secondary | ICD-10-CM | POA: Diagnosis not present

## 2017-08-23 DIAGNOSIS — S0083XA Contusion of other part of head, initial encounter: Secondary | ICD-10-CM | POA: Diagnosis not present

## 2017-08-23 DIAGNOSIS — E7800 Pure hypercholesterolemia, unspecified: Secondary | ICD-10-CM | POA: Diagnosis not present

## 2017-08-23 DIAGNOSIS — I10 Essential (primary) hypertension: Secondary | ICD-10-CM | POA: Diagnosis not present

## 2017-08-23 DIAGNOSIS — W19XXXA Unspecified fall, initial encounter: Secondary | ICD-10-CM | POA: Diagnosis not present

## 2017-08-23 DIAGNOSIS — J441 Chronic obstructive pulmonary disease with (acute) exacerbation: Secondary | ICD-10-CM | POA: Diagnosis not present

## 2017-09-20 DIAGNOSIS — L89321 Pressure ulcer of left buttock, stage 1: Secondary | ICD-10-CM | POA: Diagnosis not present

## 2017-09-20 DIAGNOSIS — E7800 Pure hypercholesterolemia, unspecified: Secondary | ICD-10-CM | POA: Diagnosis not present

## 2017-09-20 DIAGNOSIS — K5901 Slow transit constipation: Secondary | ICD-10-CM | POA: Diagnosis not present

## 2017-09-20 DIAGNOSIS — F329 Major depressive disorder, single episode, unspecified: Secondary | ICD-10-CM | POA: Diagnosis not present

## 2017-09-20 DIAGNOSIS — R251 Tremor, unspecified: Secondary | ICD-10-CM | POA: Diagnosis not present

## 2017-09-20 DIAGNOSIS — R630 Anorexia: Secondary | ICD-10-CM | POA: Diagnosis not present

## 2017-09-20 DIAGNOSIS — J441 Chronic obstructive pulmonary disease with (acute) exacerbation: Secondary | ICD-10-CM | POA: Diagnosis not present

## 2017-09-20 DIAGNOSIS — I5022 Chronic systolic (congestive) heart failure: Secondary | ICD-10-CM | POA: Diagnosis not present

## 2017-09-20 DIAGNOSIS — I25118 Atherosclerotic heart disease of native coronary artery with other forms of angina pectoris: Secondary | ICD-10-CM | POA: Diagnosis not present

## 2017-09-20 DIAGNOSIS — I10 Essential (primary) hypertension: Secondary | ICD-10-CM | POA: Diagnosis not present

## 2017-09-20 DIAGNOSIS — R262 Difficulty in walking, not elsewhere classified: Secondary | ICD-10-CM | POA: Diagnosis not present

## 2017-10-21 DIAGNOSIS — I25118 Atherosclerotic heart disease of native coronary artery with other forms of angina pectoris: Secondary | ICD-10-CM | POA: Diagnosis not present

## 2017-10-21 DIAGNOSIS — J208 Acute bronchitis due to other specified organisms: Secondary | ICD-10-CM | POA: Diagnosis not present

## 2017-10-21 DIAGNOSIS — R262 Difficulty in walking, not elsewhere classified: Secondary | ICD-10-CM | POA: Diagnosis not present

## 2017-10-21 DIAGNOSIS — J441 Chronic obstructive pulmonary disease with (acute) exacerbation: Secondary | ICD-10-CM | POA: Diagnosis not present

## 2017-10-21 DIAGNOSIS — K5901 Slow transit constipation: Secondary | ICD-10-CM | POA: Diagnosis not present

## 2017-10-21 DIAGNOSIS — R600 Localized edema: Secondary | ICD-10-CM | POA: Diagnosis not present

## 2017-10-21 DIAGNOSIS — I10 Essential (primary) hypertension: Secondary | ICD-10-CM | POA: Diagnosis not present

## 2017-10-21 DIAGNOSIS — R251 Tremor, unspecified: Secondary | ICD-10-CM | POA: Diagnosis not present

## 2017-10-21 DIAGNOSIS — R630 Anorexia: Secondary | ICD-10-CM | POA: Diagnosis not present

## 2017-10-21 DIAGNOSIS — G20 Parkinson's disease: Secondary | ICD-10-CM | POA: Diagnosis not present

## 2017-10-21 DIAGNOSIS — R05 Cough: Secondary | ICD-10-CM | POA: Diagnosis not present

## 2017-10-21 DIAGNOSIS — I5022 Chronic systolic (congestive) heart failure: Secondary | ICD-10-CM | POA: Diagnosis not present

## 2017-10-27 DIAGNOSIS — F0390 Unspecified dementia without behavioral disturbance: Secondary | ICD-10-CM | POA: Diagnosis not present

## 2017-10-27 DIAGNOSIS — I6523 Occlusion and stenosis of bilateral carotid arteries: Secondary | ICD-10-CM | POA: Diagnosis not present

## 2017-10-27 DIAGNOSIS — G20 Parkinson's disease: Secondary | ICD-10-CM | POA: Diagnosis not present

## 2017-10-28 DIAGNOSIS — I672 Cerebral atherosclerosis: Secondary | ICD-10-CM | POA: Diagnosis not present

## 2017-10-28 DIAGNOSIS — F0390 Unspecified dementia without behavioral disturbance: Secondary | ICD-10-CM | POA: Diagnosis not present

## 2017-10-29 DIAGNOSIS — G20 Parkinson's disease: Secondary | ICD-10-CM | POA: Diagnosis not present

## 2017-10-29 DIAGNOSIS — I5022 Chronic systolic (congestive) heart failure: Secondary | ICD-10-CM | POA: Diagnosis not present

## 2017-10-29 DIAGNOSIS — J208 Acute bronchitis due to other specified organisms: Secondary | ICD-10-CM | POA: Diagnosis not present

## 2017-10-29 DIAGNOSIS — G9349 Other encephalopathy: Secondary | ICD-10-CM | POA: Diagnosis not present

## 2017-10-29 DIAGNOSIS — K5901 Slow transit constipation: Secondary | ICD-10-CM | POA: Diagnosis not present

## 2017-10-29 DIAGNOSIS — R05 Cough: Secondary | ICD-10-CM | POA: Diagnosis not present

## 2017-10-29 DIAGNOSIS — R262 Difficulty in walking, not elsewhere classified: Secondary | ICD-10-CM | POA: Diagnosis not present

## 2017-10-29 DIAGNOSIS — I10 Essential (primary) hypertension: Secondary | ICD-10-CM | POA: Diagnosis not present

## 2017-10-29 DIAGNOSIS — R630 Anorexia: Secondary | ICD-10-CM | POA: Diagnosis not present

## 2017-10-29 DIAGNOSIS — J441 Chronic obstructive pulmonary disease with (acute) exacerbation: Secondary | ICD-10-CM | POA: Diagnosis not present

## 2017-10-29 DIAGNOSIS — I25118 Atherosclerotic heart disease of native coronary artery with other forms of angina pectoris: Secondary | ICD-10-CM | POA: Diagnosis not present

## 2017-10-29 DIAGNOSIS — R600 Localized edema: Secondary | ICD-10-CM | POA: Diagnosis not present

## 2017-10-29 DIAGNOSIS — R251 Tremor, unspecified: Secondary | ICD-10-CM | POA: Diagnosis not present

## 2018-03-14 IMAGING — CR DG CHEST 2V
2 series · 2 of 2 positions shown · non-contrast
Comparison: Two days ago

CLINICAL DATA: Pneumonia and cough

EXAM:
CHEST  2 VIEW

[chest lat]
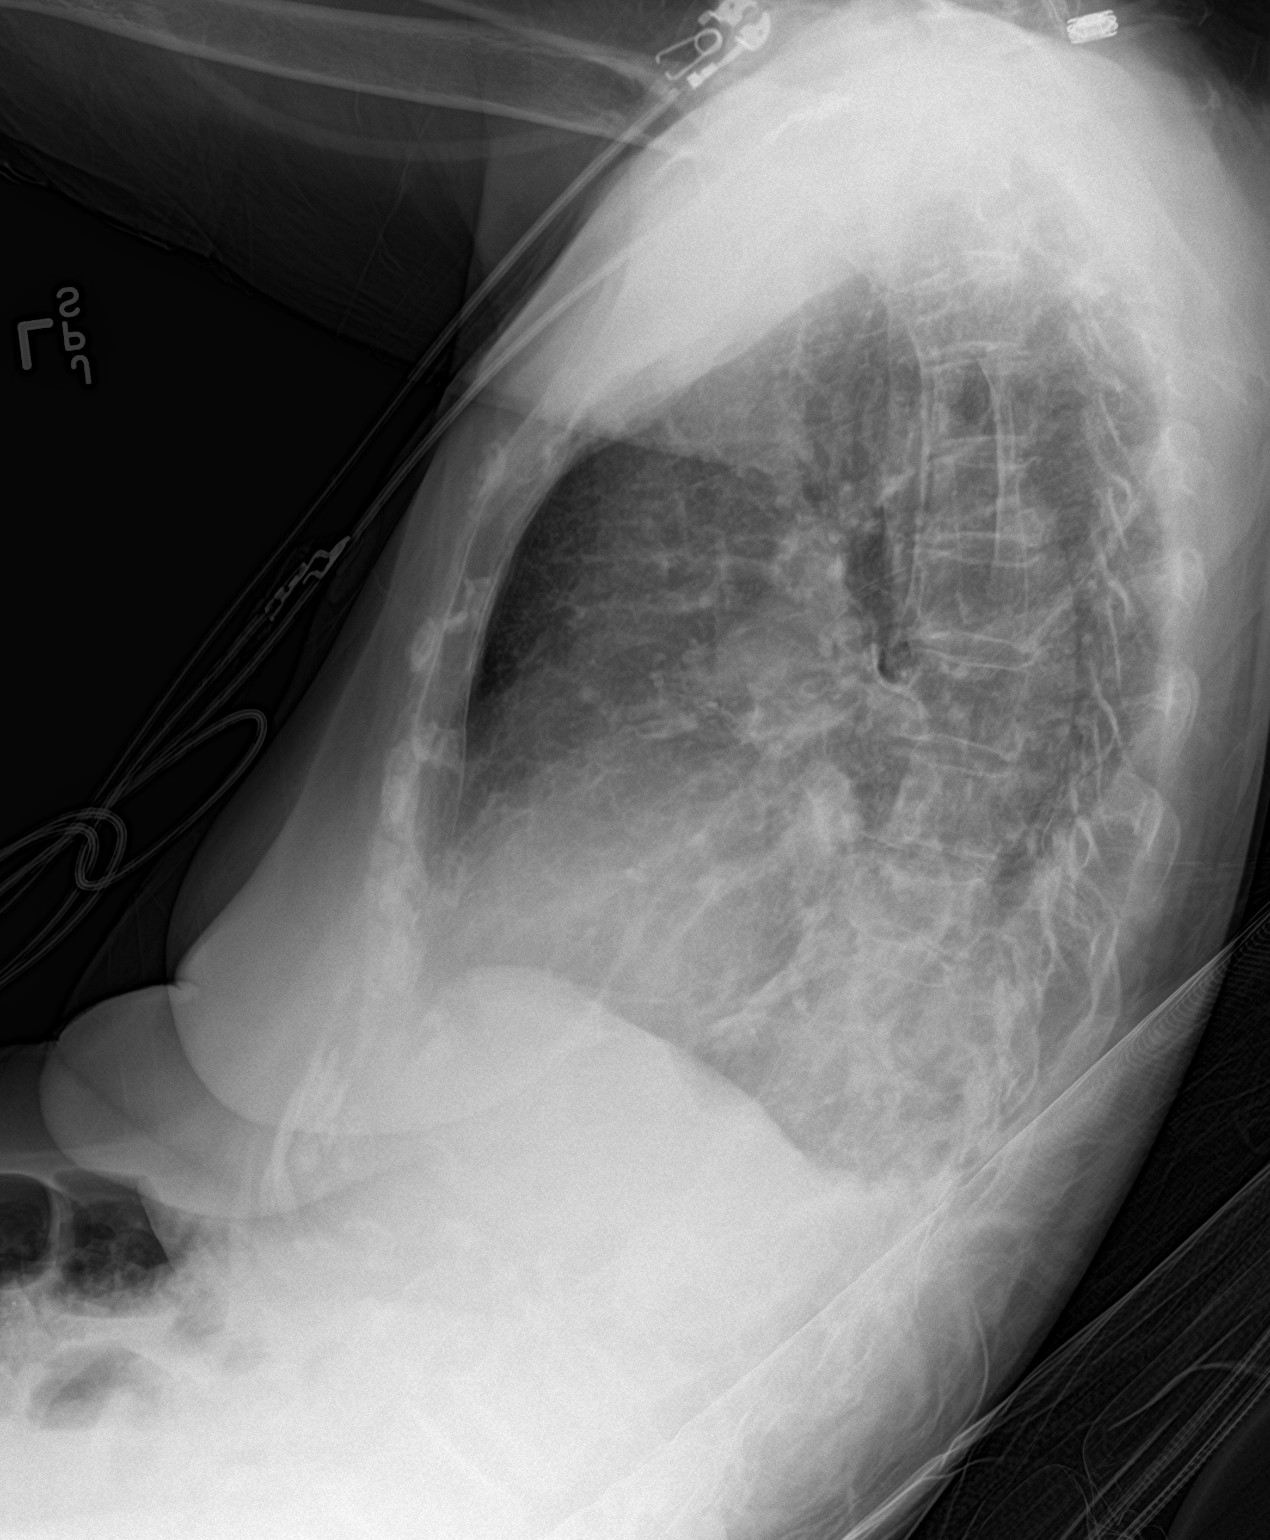

[chest ap]
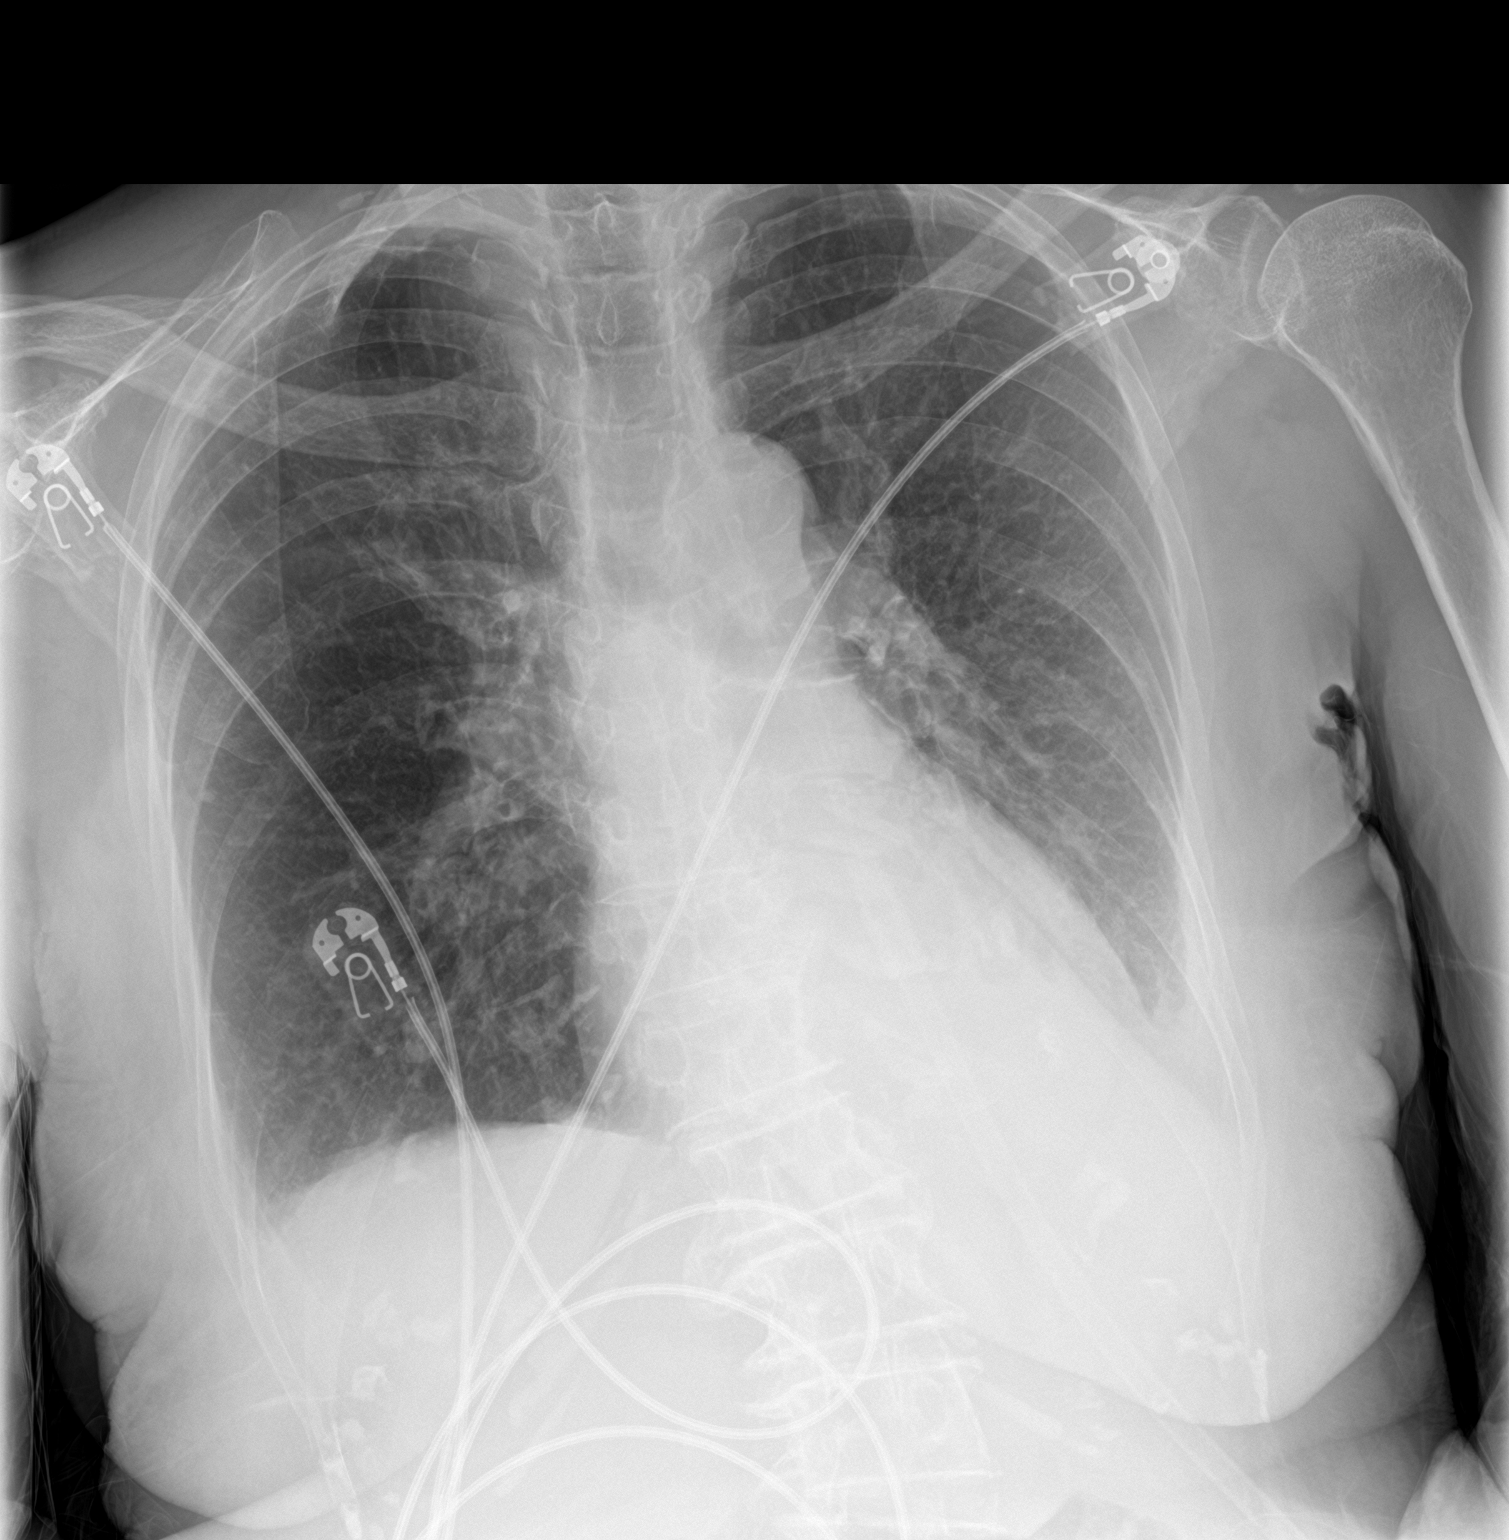

[2 of 2 positions shown; findings below may reference images not displayed]

FINDINGS: Left lower lobe opacity with small pleural effusion. The opacity has
become more dense than previously but involves a stable area. No
edema or pneumothorax. Normal heart size and mediastinal contours.
IMPRESSION: Increased density of the left basilar pneumonia. Small left
effusion.
